# Patient Record
Sex: Female | Born: 1980 | Race: Black or African American | Hispanic: No | Marital: Married | Smoking: Never smoker
Health system: Southern US, Community
[De-identification: ages and names within clinical notes are randomized; demographics above are authoritative.]

## PROBLEM LIST (undated history)

## (undated) DIAGNOSIS — K5190 Ulcerative colitis, unspecified, without complications: Secondary | ICD-10-CM

## (undated) DIAGNOSIS — N809 Endometriosis, unspecified: Secondary | ICD-10-CM

## (undated) DIAGNOSIS — K589 Irritable bowel syndrome without diarrhea: Secondary | ICD-10-CM

## (undated) DIAGNOSIS — Z889 Allergy status to unspecified drugs, medicaments and biological substances status: Secondary | ICD-10-CM

## (undated) DIAGNOSIS — K219 Gastro-esophageal reflux disease without esophagitis: Secondary | ICD-10-CM

## (undated) DIAGNOSIS — K523 Indeterminate colitis: Secondary | ICD-10-CM

## (undated) DIAGNOSIS — K2970 Gastritis, unspecified, without bleeding: Secondary | ICD-10-CM

## (undated) DIAGNOSIS — I10 Essential (primary) hypertension: Secondary | ICD-10-CM

## (undated) DIAGNOSIS — T7840XA Allergy, unspecified, initial encounter: Secondary | ICD-10-CM

## (undated) HISTORY — DX: Gastro-esophageal reflux disease without esophagitis: K21.9

## (undated) HISTORY — DX: Allergy status to unspecified drugs, medicaments and biological substances: Z88.9

## (undated) HISTORY — DX: Essential (primary) hypertension: I10

## (undated) HISTORY — DX: Irritable bowel syndrome, unspecified: K58.9

## (undated) HISTORY — PX: COLONOSCOPY: SHX174

## (undated) HISTORY — DX: Indeterminate colitis: K52.3

## (undated) HISTORY — PX: WISDOM TOOTH EXTRACTION: SHX21

## (undated) HISTORY — PX: TUBAL LIGATION: SHX77

## (undated) HISTORY — DX: Allergy, unspecified, initial encounter: T78.40XA

---

## 2012-01-26 ENCOUNTER — Emergency Department (HOSPITAL_COMMUNITY)
Admission: EM | Admit: 2012-01-26 | Discharge: 2012-01-26 | Disposition: A | Discharge status: Home / Self Care | Payer: Self-pay | Attending: Emergency Medicine | Admitting: Emergency Medicine

## 2012-01-26 ENCOUNTER — Encounter (HOSPITAL_COMMUNITY): Payer: Self-pay | Admitting: Adult Health

## 2012-01-26 DIAGNOSIS — J3489 Other specified disorders of nose and nasal sinuses: Secondary | ICD-10-CM | POA: Insufficient documentation

## 2012-01-26 DIAGNOSIS — J329 Chronic sinusitis, unspecified: Secondary | ICD-10-CM | POA: Insufficient documentation

## 2012-01-26 MED ORDER — AMOXICILLIN 500 MG PO CAPS: 500.0000 mg | ORAL_CAPSULE | Freq: Three times a day (TID) | ORAL | Status: DC

## 2012-01-26 MED ORDER — CETIRIZINE-PSEUDOEPHEDRINE ER 5-120 MG PO TB12: 1.0000 | ORAL_TABLET | Freq: Two times a day (BID) | ORAL | Status: DC

## 2012-01-26 MED ORDER — GUAIFENESIN ER 600 MG PO TB12: 1200.0000 mg | ORAL_TABLET | Freq: Two times a day (BID) | ORAL | Status: DC

## 2012-01-26 NOTE — ED Provider Notes (Signed)
History     CSN: [id number]  Arrival date & time 01/26/12  2014   First MD Initiated Contact with Patient 01/26/12 2157      Chief Complaint  Patient presents with  . Facial Pain   HPI  History provided by the patient. Patient is a 31 year old female with no significant PMH who presents with complaints of persistent and continued sinus pressure with face and headache pains. Patient reports having sinus is congestion symptoms for the past several weeks. She was seen and diagnosed with a sinus infection on November 25. At that time she was given a prescription for Flonase and prednisone. Patient took a short course of prednisone and has been using the Flonase. She reported having some improvement of symptoms but they continued to linger and have now worsened again. Pressure at times builds and puts pain by both eyes and 4 head. Patient also has frequent popping of the ears. She denies having any postnasal drip or sore throat. Denies any coughing. Denies any fever, chills or sweats. Patient does not normally have seasonal allergies or similar symptoms frequently.    History reviewed. No pertinent past medical history.  History reviewed. No pertinent past surgical history.  History reviewed. No pertinent family history.  History  Substance Use Topics  . Smoking status: Never Smoker   . Smokeless tobacco: Not on file  . Alcohol Use: No    OB History    Grav Para Term Preterm Abortions TAB SAB Ect Mult Living                  Review of Systems  Constitutional: Negative for fever, chills and diaphoresis.  HENT: Positive for congestion and sinus pressure. Negative for sore throat, rhinorrhea, trouble swallowing and neck pain.   Respiratory: Negative for cough.   Gastrointestinal: Negative for nausea, vomiting and diarrhea.  All other systems reviewed and are negative.    Allergies  Keflex  Home Medications   Current Outpatient Rx  Name  Route  Sig  Dispense  Refill  .  FLUTICASONE PROPIONATE 50 MCG/ACT NA SUSP   Nasal   Place 2 sprays into the nose daily.         . IBUPROFEN 200 MG PO TABS   Oral   Take 600 mg by mouth every 6 (six) hours as needed. For pain           BP 129/87  Pulse 72  Temp 98.9 F (37.2 C) (Oral)  Resp 16  SpO2 98%  Physical Exam  Nursing note and vitals reviewed. Constitutional: She is oriented to person, place, and time. She appears well-developed and well-nourished. No distress.  HENT:  Head: Normocephalic.  Right Ear: Tympanic membrane normal.  Left Ear: Tympanic membrane normal.  Mouth/Throat: Oropharynx is clear and moist.  Cardiovascular: Normal rate and regular rhythm.   Pulmonary/Chest: Effort normal and breath sounds normal. No respiratory distress. She has no wheezes. She has no rales.  Abdominal: Soft. There is no tenderness.  Neurological: She is alert and oriented to person, place, and time.  Skin: Skin is warm and dry. No rash noted.  Psychiatric: She has a normal mood and affect. Her behavior is normal.    ED Course  Procedures       1. Sinusitis       MDM  10:15PM patient seen and evaluated. Patient well-appearing and nontoxic.        Angus Seller, [place] 01/26/12 [phone]

## 2012-01-26 NOTE — ED Notes (Signed)
Presents with recent dx of sinus infection on the 25th of November. Given prednisone spray. Pressure in sinuses and eyes is getting worse also having more frequent headaches. Pt is alert and opriented, denies blurred vision. No neuro deficits.

## 2012-01-27 NOTE — ED Provider Notes (Signed)
Medical screening examination/treatment/procedure(s) were conducted as a shared visit with non-physician practitioner(s) and myself.  I personally evaluated the patient during the encounter  Marylen Zuk, MD 01/27/12 0118 

## 2012-04-09 ENCOUNTER — Encounter (HOSPITAL_COMMUNITY): Payer: Self-pay | Admitting: Emergency Medicine

## 2012-04-09 ENCOUNTER — Other Ambulatory Visit: Payer: Self-pay

## 2012-04-09 ENCOUNTER — Emergency Department (HOSPITAL_COMMUNITY): Payer: BC Managed Care – PPO

## 2012-04-09 ENCOUNTER — Emergency Department (HOSPITAL_COMMUNITY)
Admission: EM | Admit: 2012-04-09 | Discharge: 2012-04-09 | Disposition: A | Discharge status: Home / Self Care | Payer: BC Managed Care – PPO | Attending: Emergency Medicine | Admitting: Emergency Medicine

## 2012-04-09 DIAGNOSIS — J3489 Other specified disorders of nose and nasal sinuses: Secondary | ICD-10-CM | POA: Insufficient documentation

## 2012-04-09 DIAGNOSIS — R079 Chest pain, unspecified: Secondary | ICD-10-CM | POA: Insufficient documentation

## 2012-04-09 DIAGNOSIS — K2970 Gastritis, unspecified, without bleeding: Secondary | ICD-10-CM | POA: Insufficient documentation

## 2012-04-09 DIAGNOSIS — Z3202 Encounter for pregnancy test, result negative: Secondary | ICD-10-CM | POA: Insufficient documentation

## 2012-04-09 DIAGNOSIS — R6883 Chills (without fever): Secondary | ICD-10-CM | POA: Insufficient documentation

## 2012-04-09 DIAGNOSIS — Z8719 Personal history of other diseases of the digestive system: Secondary | ICD-10-CM | POA: Insufficient documentation

## 2012-04-09 DIAGNOSIS — R0602 Shortness of breath: Secondary | ICD-10-CM | POA: Insufficient documentation

## 2012-04-09 DIAGNOSIS — H6690 Otitis media, unspecified, unspecified ear: Secondary | ICD-10-CM | POA: Insufficient documentation

## 2012-04-09 HISTORY — DX: Gastritis, unspecified, without bleeding: K29.70

## 2012-04-09 LAB — CBC WITH DIFFERENTIAL/PLATELET
Basophils Absolute: 0 10*3/uL (ref 0.0–0.1)
Basophils Relative: 0 % (ref 0–1)
Hemoglobin: 13.4 g/dL (ref 12.0–15.0)
MCHC: 33 g/dL (ref 30.0–36.0)
Neutro Abs: 13.1 10*3/uL — ABNORMAL HIGH (ref 1.7–7.7)
Neutrophils Relative %: 80 % — ABNORMAL HIGH (ref 43–77)
RDW: 12.5 % (ref 11.5–15.5)

## 2012-04-09 LAB — BASIC METABOLIC PANEL
Chloride: 101 mEq/L (ref 96–112)
GFR calc Af Amer: >90 mL/min (ref >90)
Potassium: 3.7 mEq/L (ref 3.5–5.1)

## 2012-04-09 LAB — POCT I-STAT TROPONIN I: Troponin i, poc: 0.01 ng/mL (ref 0.00–0.08)

## 2012-04-09 LAB — POCT PREGNANCY, URINE: Preg Test, Ur: NEGATIVE

## 2012-04-09 MED ORDER — AMOXICILLIN 500 MG PO CAPS: 500.0000 mg | ORAL_CAPSULE | Freq: Three times a day (TID) | ORAL | Status: DC

## 2012-04-09 NOTE — ED Notes (Addendum)
Pt c/o midsternal CP with radiation into neck x 1 week worse today; pt sts some SOB and pressure with pain into neck; pt sts pain worse with inspiration, cough and palpation

## 2012-04-09 NOTE — ED Notes (Signed)
Discharge instructions reviewed. Pt verbalized understanding.  

## 2012-04-09 NOTE — ED Provider Notes (Signed)
History     CSN: [id number]  Arrival date & time 04/09/12  4098   First MD Initiated Contact with Patient 04/09/12 1043      Chief Complaint  Patient presents with  . Chest Pain    (Consider location/radiation/quality/duration/timing/severity/associated sxs/prior treatment) Patient is a 32 y.o. female presenting with chest pain. The history is provided by the patient. No language interpreter was used.  Chest Pain Pain location:  Substernal area (Pt had chest pain last week.  She also felt nasal congestion and earache.  Today she was at school and nearly passed out.) Pain quality: aching   Pain radiates to:  Upper back Pain radiates to the back: yes   Pain severity:  Moderate Onset quality:  Gradual Duration: Chest pain started last week. Timing:  Intermittent Progression:  Waxing and waning Chronicity:  New Relieved by:  Nothing Ineffective treatments:  None tried Associated symptoms: shortness of breath   Associated symptoms: no cough and no fever   Associated symptoms comment:  Earache, nasal congestion. Risk factors comment:  She took Zyrtec and prednisone, which she had from a prior illness, without benefit.   Past Medical History  Diagnosis Date  . Gastritis     History reviewed. No pertinent past surgical history.  History reviewed. No pertinent family history.  History  Substance Use Topics  . Smoking status: Never Smoker   . Smokeless tobacco: Not on file  . Alcohol Use: No    OB History   Grav Para Term Preterm Abortions TAB SAB Ect Mult Living                  Review of Systems  Constitutional: Positive for chills. Negative for fever.  HENT: Positive for ear pain.   Eyes: Negative.   Respiratory: Positive for shortness of breath. Negative for cough.   Cardiovascular: Positive for chest pain.  Gastrointestinal: Negative.   Genitourinary: Negative.   Skin: Negative.   Neurological: Negative.   Psychiatric/Behavioral: Negative.      Allergies  Keflex  Home Medications   Current Outpatient Rx  Name  Route  Sig  Dispense  Refill  . cetirizine (ZYRTEC) 10 MG tablet   Oral   Take 10 mg by mouth daily as needed for allergies.         . fluticasone (FLONASE) 50 MCG/ACT nasal spray   Each Nare   Place 1 spray into both nostrils daily as needed for allergies.            BP 146/89  Pulse 79  Temp(Src) 98.9 F (37.2 C) (Oral)  Resp 18  SpO2 99%  Physical Exam  Nursing note and vitals reviewed. Constitutional: She is oriented to person, place, and time. She appears well-developed and well-nourished. No distress.  HENT:  Head: Normocephalic and atraumatic.  Both TM's red and retracted.  Oropharynx clear.  Eyes: Conjunctivae and EOM are normal. Pupils are equal, round, and reactive to light. Left eye exhibits no discharge. No scleral icterus.  Neck: Normal range of motion. Neck supple.  Cardiovascular: Normal rate, regular rhythm and normal heart sounds.   Pulmonary/Chest: Effort normal and breath sounds normal.  Abdominal: Soft. Bowel sounds are normal.  Musculoskeletal: Normal range of motion. She exhibits no edema and no tenderness.  Neurological: She is oriented to person, place, and time.  No sensory or motor deficit.  Skin: Skin is warm and dry.  Psychiatric: She has a normal mood and affect. Her behavior is normal.  ED Course  Procedures (including critical care time)    11:04 AM  Date: 04/09/2012  Rate: 89  Rhythm: normal sinus rhythm  QRS Axis: normal  Intervals: normal QRS:  Poor R wave progression in precordial leads suggests possible old anterior myocardial infarction.  ST/T Wave abnormalities: normal  Conduction Disutrbances:none  Narrative Interpretation: Abnormal EKG  Old EKG Reviewed: none available  Results for orders placed during the hospital encounter of 04/09/12  CBC WITH DIFFERENTIAL      Result Value Range   WBC 16.4 (*) 4.0 - 10.5 K/uL   RBC 5.07  3.87 - 5.11  MIL/uL   Hemoglobin 13.4  12.0 - 15.0 g/dL   HCT 36.6  44.0 - 34.7 %   MCV 80.1  78.0 - 100.0 fL   MCH 26.4  26.0 - 34.0 pg   MCHC 33.0  30.0 - 36.0 g/dL   RDW 42.5  95.6 - 38.7 %   Platelets 303  150 - 400 K/uL   Neutrophils Relative 80 (*) 43 - 77 %   Neutro Abs 13.1 (*) 1.7 - 7.7 K/uL   Lymphocytes Relative 14  12 - 46 %   Lymphs Abs 2.2  0.7 - 4.0 K/uL   Monocytes Relative 6  3 - 12 %   Monocytes Absolute 1.0  0.1 - 1.0 K/uL   Eosinophils Relative 0  0 - 5 %   Eosinophils Absolute 0.0  0.0 - 0.7 K/uL   Basophils Relative 0  0 - 1 %   Basophils Absolute 0.0  0.0 - 0.1 K/uL  BASIC METABOLIC PANEL      Result Value Range   Sodium 137  135 - 145 mEq/L   Potassium 3.7  3.5 - 5.1 mEq/L   Chloride 101  96 - 112 mEq/L   CO2 28  19 - 32 mEq/L   Glucose, Bld 89  70 - 99 mg/dL   BUN 10  6 - 23 mg/dL   Creatinine, Ser 5.64  0.50 - 1.10 mg/dL   Calcium 9.3  8.4 - 33.2 mg/dL   GFR calc non Af Amer >90  >90 mL/min   GFR calc Af Amer >90  >90 mL/min  POCT I-STAT TROPONIN I      Result Value Range   Troponin i, poc 0.01  0.00 - 0.08 ng/mL   Comment 3           POCT PREGNANCY, URINE      Result Value Range   Preg Test, Ur NEGATIVE  NEGATIVE   Dg Chest 2 View  04/09/2012  *RADIOLOGY REPORT*  Clinical Data: Chest pain  CHEST - 2 VIEW  Comparison: None.  Findings:  Lungs clear.  Heart size and pulmonary vascularity are normal.  No adenopathy.  No bone lesions.  No pneumothorax.  IMPRESSION: No abnormality noted.   Original Report Authenticated By: Bretta Bang, M.D.     Lab workup showed elevated WBC, suggesting infection.  TNI negative, chest x-ray negative.  No signs of acute coronary syndrome.  Advised amoxicillin, which she has taken before safely, for her otitis media/  1. Otitis media       Carleene Cooper III, MD 04/09/12 [phone]

## 2012-04-09 NOTE — ED Notes (Signed)
MD at bedside. 

## 2013-01-08 ENCOUNTER — Ambulatory Visit (INDEPENDENT_AMBULATORY_CARE_PROVIDER_SITE_OTHER): Payer: BC Managed Care – PPO | Admitting: Family Medicine

## 2013-01-08 ENCOUNTER — Encounter: Payer: Self-pay | Admitting: Family Medicine

## 2013-01-08 VITALS — BP 128/86 | HR 78 | Temp 98.2°F | Resp 18 | Ht 66.0 in | Wt 206.0 lb

## 2013-01-08 DIAGNOSIS — R635 Abnormal weight gain: Secondary | ICD-10-CM

## 2013-01-08 DIAGNOSIS — E669 Obesity, unspecified: Secondary | ICD-10-CM

## 2013-01-08 DIAGNOSIS — Z13220 Encounter for screening for lipoid disorders: Secondary | ICD-10-CM

## 2013-01-08 DIAGNOSIS — K5900 Constipation, unspecified: Secondary | ICD-10-CM

## 2013-01-08 MED ORDER — POLYETHYLENE GLYCOL 3350 17 GM/SCOOP PO POWD: 17.0000 g | Freq: Two times a day (BID) | ORAL | Status: DC | PRN

## 2013-01-08 MED ORDER — LORCASERIN HCL 10 MG PO TABS: ORAL_TABLET | ORAL | Status: DC

## 2013-01-08 NOTE — Progress Notes (Signed)
  Subjective:    Patient ID: Theresa Farmer, female    DOB: 09-16-1980, 32 y.o.   MRN: [id number]  HPI  Dr. Juliene Pina [facility]- [facility] Patient here to discuss her weight. She's been trying for many years to lose weight. Her heaviest weight was 230 pounds which is approximately 8 years ago. She's been maintaining her weight around 205 pounds since 2012. She's tried multiple work out the [employer], [employer], soon to be she's not been able to lose past 205 pounds. She's also tried dieting cutting out red meat and he then multiple Daniel fast. She would like to try prescription medication to help with her weight loss. She also suffers with constipation she typically takes a laxative every 3-4 days Lortab a bowel movement otherwise she can go a week or 2 without one.  Review of Systems  GEN- denies fatigue, fever, weight loss,weakness, recent illness HEENT- denies eye drainage, change in vision, nasal discharge, CVS- denies chest pain, palpitations RESP- denies SOB, cough, wheeze ABD- denies N/V, change in stools, abd pain GU- denies dysuria, hematuria, dribbling, incontinence MSK- denies joint pain, muscle aches, injury Neuro- denies headache, dizziness, syncope, seizure activity      Objective:   Physical Exam GEN- NAD, alert and oriented x3 HEENT- PERRL, EOMI, non injected sclera, pink conjunctiva, MMM, oropharynx clear Neck- Supple, no thyromegaly CVS- RRR, no murmur RESP-CTAB ABD-NABS,soft,NT,ND EXT- No edema Pulses- Radial, DP- 2+        Assessment & Plan:

## 2013-01-08 NOTE — Patient Instructions (Addendum)
Start the belviq 1 tablet twice a day with meals Miralax for the constipation once a day  Labs to be done - we will send a letter if normal  F/U 8 weeks

## 2013-01-09 LAB — CBC WITH DIFFERENTIAL/PLATELET
Basophils Absolute: 0 10*3/uL (ref 0.0–0.1)
Basophils Relative: 0 % (ref 0–1)
Hemoglobin: 12.5 g/dL (ref 12.0–15.0)
MCHC: 34.1 g/dL (ref 30.0–36.0)
Monocytes Relative: 7 % (ref 3–12)
Neutro Abs: 8.6 10*3/uL — ABNORMAL HIGH (ref 1.7–7.7)
Neutrophils Relative %: 72 % (ref 43–77)
Platelets: 319 10*3/uL (ref 150–400)
RBC: 4.71 MIL/uL (ref 3.87–5.11)

## 2013-01-09 LAB — LIPID PANEL: Cholesterol: 132 mg/dL (ref 0–200)

## 2013-01-09 LAB — TSH: TSH: 1.006 u[IU]/mL (ref 0.350–4.500)

## 2013-01-09 LAB — COMPREHENSIVE METABOLIC PANEL
AST: 15 U/L (ref 0–37)
Albumin: 4 g/dL (ref 3.5–5.2)
BUN: 9 mg/dL (ref 6–23)
Calcium: 9.2 mg/dL (ref 8.4–10.5)
Chloride: 104 mEq/L (ref 96–112)
Glucose, Bld: 88 mg/dL (ref 70–99)
Potassium: 3.5 mEq/L (ref 3.5–5.3)

## 2013-01-11 DIAGNOSIS — E669 Obesity, unspecified: Secondary | ICD-10-CM | POA: Insufficient documentation

## 2013-01-11 DIAGNOSIS — K5900 Constipation, unspecified: Secondary | ICD-10-CM | POA: Insufficient documentation

## 2013-01-11 NOTE — Assessment & Plan Note (Signed)
Pt obese based on BMI, also has family history of DM/HTN.   She would benefit from weight loss, to reduce her risk factors of CAD, DM, HTN Check fasting labs today Plan to start belviq 10mg  BID Short term goals set Continue exercise program cardio 30-45 minutes, 4-5 days a week

## 2013-01-11 NOTE — Assessment & Plan Note (Signed)
Miralax daily, increase fiber and water intake

## 2013-01-13 ENCOUNTER — Telehealth: Payer: Self-pay | Admitting: Physician Assistant

## 2013-01-13 ENCOUNTER — Encounter: Payer: Self-pay | Admitting: *Deleted

## 2013-01-13 NOTE — Telephone Encounter (Signed)
Weight loss pills were not covered about insurance and it was $238 for 30 days and she cant not afford that if there is something that is covered by [facility] Call back number is [phone]

## 2013-01-13 NOTE — Telephone Encounter (Signed)
Spoke to patient.  Told we have no idea what is covered under all the different insurance plans.  She needs to call her insurance provider to find out what her plan covers.  Then let us know and we will see what provider wants to order.

## 2013-03-05 ENCOUNTER — Ambulatory Visit: Payer: BC Managed Care – PPO | Admitting: Family Medicine

## 2013-04-10 ENCOUNTER — Telehealth: Payer: Self-pay | Admitting: Physician Assistant

## 2013-04-10 NOTE — Telephone Encounter (Signed)
Call back number is [phone] Pt is needing to speak to you about having a prior authorization on Lorcaserin HCl (BELVIQ) 10 MG TABS [facility]acy [facility] [address]   [phone] (prior Authorization)

## 2013-04-11 NOTE — Telephone Encounter (Signed)
Working on [employer] sending on fax

## 2013-04-15 NOTE — Telephone Encounter (Signed)
PA sent waiting on approval from insurance company

## 2013-05-01 NOTE — Telephone Encounter (Signed)
Call placed to insurance company.   PA submitted by phone.   Approved 04/01/2013- 07/30/2013.  Approval # [id number].  Call placed to patient and patient made aware.

## 2013-07-15 ENCOUNTER — Telehealth: Payer: Self-pay | Admitting: *Deleted

## 2013-07-15 NOTE — Telephone Encounter (Signed)
Received refill request for Belviq.   Ok to refill??  Last office visit/ refill 01/08/2013.

## 2013-07-15 NOTE — Telephone Encounter (Signed)
Call placed to patient and patient made aware.   Appointment scheduled & advised about past due balance.

## 2013-07-15 NOTE — Telephone Encounter (Signed)
Need OV

## 2013-07-18 ENCOUNTER — Ambulatory Visit: Payer: BC Managed Care – PPO | Admitting: Family Medicine

## 2013-07-21 ENCOUNTER — Other Ambulatory Visit: Payer: Self-pay | Admitting: *Deleted

## 2013-08-15 ENCOUNTER — Ambulatory Visit: Payer: BC Managed Care – PPO | Admitting: Family Medicine

## 2013-09-01 ENCOUNTER — Ambulatory Visit: Payer: BC Managed Care – PPO | Admitting: Family Medicine

## 2013-09-05 ENCOUNTER — Ambulatory Visit: Payer: BC Managed Care – PPO | Admitting: Family Medicine

## 2014-03-16 ENCOUNTER — Ambulatory Visit: Payer: BC Managed Care – PPO | Admitting: Family Medicine

## 2014-10-09 ENCOUNTER — Encounter: Payer: Self-pay | Admitting: Family Medicine

## 2014-10-09 ENCOUNTER — Ambulatory Visit
Admission: RE | Admit: 2014-10-09 | Discharge: 2014-10-09 | Disposition: A | Discharge status: Home / Self Care | Payer: BC Managed Care – PPO | Source: Ambulatory Visit | Attending: Family Medicine | Admitting: Family Medicine

## 2014-10-09 ENCOUNTER — Telehealth: Payer: Self-pay | Admitting: *Deleted

## 2014-10-09 ENCOUNTER — Ambulatory Visit (INDEPENDENT_AMBULATORY_CARE_PROVIDER_SITE_OTHER): Payer: BC Managed Care – PPO | Admitting: Family Medicine

## 2014-10-09 VITALS — BP 122/74 | HR 64 | Temp 98.4°F | Resp 12 | Ht 66.0 in | Wt 219.0 lb

## 2014-10-09 DIAGNOSIS — Z13220 Encounter for screening for lipoid disorders: Secondary | ICD-10-CM

## 2014-10-09 DIAGNOSIS — R197 Diarrhea, unspecified: Secondary | ICD-10-CM

## 2014-10-09 DIAGNOSIS — E669 Obesity, unspecified: Secondary | ICD-10-CM

## 2014-10-09 LAB — CBC WITH DIFFERENTIAL/PLATELET
BASOS PCT: 0 % (ref 0–1)
Basophils Absolute: 0 10*3/uL (ref 0.0–0.1)
EOS ABS: 0.1 10*3/uL (ref 0.0–0.7)
EOS PCT: 1 % (ref 0–5)
HCT: 35.7 % — ABNORMAL LOW (ref 36.0–46.0)
HEMOGLOBIN: 11.6 g/dL — AB (ref 12.0–15.0)
Lymphocytes Relative: 25 % (ref 12–46)
Lymphs Abs: 2.4 10*3/uL (ref 0.7–4.0)
MCH: 25.5 pg — AB (ref 26.0–34.0)
MCHC: 32.5 g/dL (ref 30.0–36.0)
MCV: 78.5 fL (ref 78.0–100.0)
MONO ABS: 0.6 10*3/uL (ref 0.1–1.0)
MONOS PCT: 6 % (ref 3–12)
MPV: 8.7 fL (ref 8.6–12.4)
Neutro Abs: 6.4 10*3/uL (ref 1.7–7.7)
Neutrophils Relative %: 68 % (ref 43–77)
Platelets: 339 10*3/uL (ref 150–400)
RBC: 4.55 MIL/uL (ref 3.87–5.11)
RDW: 13.6 % (ref 11.5–15.5)
WBC: 9.4 10*3/uL (ref 4.0–10.5)

## 2014-10-09 LAB — COMPREHENSIVE METABOLIC PANEL
ALBUMIN: 3.6 g/dL (ref 3.6–5.1)
ALT: 10 U/L (ref 6–29)
AST: 10 U/L (ref 10–30)
Alkaline Phosphatase: 83 U/L (ref 33–115)
BILIRUBIN TOTAL: 0.2 mg/dL (ref 0.2–1.2)
BUN: 7 mg/dL (ref 7–25)
CALCIUM: 8.4 mg/dL — AB (ref 8.6–10.2)
CHLORIDE: 104 mmol/L (ref 98–110)
CO2: 25 mmol/L (ref 20–31)
CREATININE: 0.99 mg/dL (ref 0.50–1.10)
GLUCOSE: 90 mg/dL (ref 70–99)
Potassium: 4 mmol/L (ref 3.5–5.3)
SODIUM: 138 mmol/L (ref 135–146)
Total Protein: 6.9 g/dL (ref 6.1–8.1)

## 2014-10-09 LAB — LIPID PANEL
CHOL/HDL RATIO: 3.6 ratio (ref <=5.0)
CHOLESTEROL: 143 mg/dL (ref 125–200)
HDL: 40 mg/dL — ABNORMAL LOW (ref >=46)
LDL CALC: 90 mg/dL (ref <130)
TRIGLYCERIDES: 67 mg/dL (ref <150)
VLDL: 13 mg/dL (ref <30)

## 2014-10-09 LAB — TSH: TSH: 1.619 u[IU]/mL (ref 0.350–4.500)

## 2014-10-09 NOTE — Progress Notes (Signed)
Patient ID: Theresa Farmer, female   DOB: 1980-05-30, 34 y.o.   MRN: [id number]   Subjective:    Patient ID: Theresa Farmer, female    DOB: 05/27/1980, 34 y.o.   MRN: [id number]  Patient presents for Frequent Diarrhea  patient here with frequent episodes of diarrhea. For the past 6 months or more she's had episodes of diarrhea every  time she eats sometimes it is watery other times it is more of a pasty but she has not had any formed stools. She does notice it when she drinks red wine and fast food but then tried to back off of the somewhat still have the episodes no matter what she ate. She has history of gastroenteritis in the past and she had some acid reflux but those symptoms have resolved. She's had problems with severe constipation since she was a child but after she did some type of detox about a year ago her bowels are more regular. She only gets cramping wipes before she has a bowel movement but often has to go very quickly or she will have an accident. She has passed gas and had leakage of stool before. She is also having bloating and a lot of flatulence. She has family history of constipation  No weight loss over the period of this change in her bowels   Review Of Systems:  GEN- denies fatigue, fever, weight loss,weakness, recent illness HEENT- denies eye drainage, change in vision, nasal discharge, CVS- denies chest pain, palpitations RESP- denies SOB, cough, wheeze ABD- denies N/V, +change in stools, +abd pain GU- denies dysuria, hematuria, dribbling, incontinence MSK- denies joint pain, muscle aches, injury Neuro- denies headache, dizziness, syncope, seizure activity       Objective:    BP 122/74 mmHg  Pulse 64  Temp(Src) 98.4 F (36.9 C) (Oral)  Resp 12  Ht  (1.676 m)  Wt 219 lb (99.338 kg)  BMI 35.36 kg/m2  LMP 09/21/2014 (Approximate) GEN- NAD, alert and oriented x3 HEENT- PERRL, EOMI, non injected sclera, pink conjunctiva, MMM, oropharynx clear Neck- Supple, no  LAD  CVS- RRR, no murmur RESP-CTAB ABD-NABS,soft,NT,ND  EXT- No edema Pulses- Radial, DP- 2+        Assessment & Plan:      Problem List Items Addressed This Visit    Obesity    Other Visit Diagnoses    Diarrhea    -  Primary    ? if underlying constipation, will check abdominal xray, will also check labs and celiac antibody, stool culture as she has problems with certain foods. If these are negative will sendto GI    Relevant Orders    CBC with Differential/Platelet    Comprehensive metabolic panel    TSH    DG Abd 2 Views (Completed)    Stool culture    Fecal leukocytes    Fecal Occult Blood, Guaiac    Gliadin antibodies, serum    Fecal Lactoferrin    Fecal occult blood, imunochemical    Screening cholesterol level        Relevant Orders    Lipid panel       Note: This dictation was prepared with Dragon dictation along with smaller phrase technology. Any transcriptional errors that result from this process are unintentional.

## 2014-10-09 NOTE — Patient Instructions (Addendum)
Get xray done- [address] [place], [place] We will call with lab results Bring back stool sample F/U pending results

## 2014-10-09 NOTE — Telephone Encounter (Signed)
Patient called for ABD X-ray result.  Advised patient of negative x-ray.  X-ray did show moderate stool burden.  Advised Miralax daily, call back for Gi referral if still having pain.

## 2014-10-10 LAB — FECAL OCCULT BLOOD, IMMUNOCHEMICAL: FECAL OCCULT BLOOD: POSITIVE — AB

## 2014-10-10 LAB — FECAL LACTOFERRIN, QUANT: Lactoferrin: POSITIVE

## 2014-10-12 LAB — GLIADIN ANTIBODIES, SERUM
Gliadin IgA: 5 Units (ref <20)
Gliadin IgG: 2 Units (ref <20)

## 2014-10-13 LAB — STOOL CULTURE

## 2014-10-14 ENCOUNTER — Other Ambulatory Visit: Payer: Self-pay | Admitting: *Deleted

## 2014-10-14 DIAGNOSIS — K921 Melena: Secondary | ICD-10-CM

## 2014-10-14 DIAGNOSIS — D649 Anemia, unspecified: Secondary | ICD-10-CM

## 2014-10-14 DIAGNOSIS — K589 Irritable bowel syndrome without diarrhea: Secondary | ICD-10-CM

## 2014-10-16 ENCOUNTER — Encounter: Payer: Self-pay | Admitting: Physician Assistant

## 2014-11-04 ENCOUNTER — Ambulatory Visit: Payer: BC Managed Care – PPO | Admitting: Physician Assistant

## 2014-11-12 ENCOUNTER — Other Ambulatory Visit: Payer: Self-pay | Admitting: Family Medicine

## 2014-11-20 ENCOUNTER — Ambulatory Visit (INDEPENDENT_AMBULATORY_CARE_PROVIDER_SITE_OTHER): Payer: BC Managed Care – PPO | Admitting: Gastroenterology

## 2014-11-20 ENCOUNTER — Encounter: Payer: Self-pay | Admitting: Gastroenterology

## 2014-11-20 VITALS — BP 112/70 | Ht 66.0 in | Wt 214.0 lb

## 2014-11-20 DIAGNOSIS — K625 Hemorrhage of anus and rectum: Secondary | ICD-10-CM | POA: Diagnosis not present

## 2014-11-20 NOTE — Progress Notes (Signed)
11/20/2014 Theresa Farmer [id number] 27-Jan-1981   HISTORY OF PRESENT ILLNESS:  This is a 34 year old female who is new to our practice and was referred here by her PCP, Dr. Jeanice Lim, to discuss colonoscopy.  She reports issues with chronic constipation since she was a child.  Recalls being told in the past that she likely has IBS.  Has been seeing blood in her stools with most bowel movements for a while.  Recently she had several weeks of diarrhea and saw her PCP.  FOBT was positive along with fecal WBC/lactoferrin.  Stool culture was negative.  CBC, TSH, CMP were all unremarkable.  Has a mild anemia with Hgb 11.6 grams.  Diarrhea/loose stools have since resolved, but she continues to see red blood with BM's.  She knows that she has hemorrhoids and contributes the bleeding to those.   Past Medical History  Diagnosis Date  . Gastritis   . IBS (irritable bowel syndrome)    Past Surgical History  Procedure Laterality Date  . Tubal ligation      reports that she has never smoked. She has never used smokeless tobacco. She reports that she drinks about 1.2 oz of alcohol per week. She reports that she does not use illicit drugs. family history includes Breast cancer in her maternal grandmother; Colon cancer in an other family member; Diabetes in an other family member; Hypertension in an other family member; Irritable bowel syndrome in an other family member. Allergies  Allergen Reactions  . Keflex [Cephalexin]     hives      Outpatient Encounter Prescriptions as of 11/20/2014  Medication Sig  . cetirizine (ZYRTEC) 10 MG tablet Take 10 mg by mouth daily as needed for allergies.  . ferrous sulfate 325 (65 FE) MG tablet TAKE 1 TABLET BY MOUTH TWICE A DAY  . fluticasone (FLONASE) 50 MCG/ACT nasal spray Place 1 spray into both nostrils daily as needed for allergies.   Marland Kitchen norethindrone-ethinyl estradiol (JUNEL FE,GILDESS FE,LOESTRIN FE) 1-20 MG-MCG tablet Take 1 tablet by mouth daily.  .  [DISCONTINUED] UNKNOWN TO PATIENT Birth control pills- (1) tab PO QD   No facility-administered encounter medications on file as of 11/20/2014.     REVIEW OF SYSTEMS  : All other systems reviewed and negative except where noted in the History of Present Illness.   PHYSICAL EXAM: BP 112/70 mmHg  Ht  (1.676 m)  Wt 214 lb (97.07 kg)  BMI 34.56 kg/m2  LMP 10/20/2014 General: Well developed black female in no acute distress Head: Normocephalic and atraumatic Eyes:  Sclerae anicteric, conjunctiva pink. Ears: Normal auditory acuity Lungs: Clear throughout to auscultation Heart: Regular rate and rhythm Abdomen: Soft, non-distended.  Normal bowel sounds.  Non-tender. Rectal:  Will be done at the time of colonoscopy. Musculoskeletal: Symmetrical with no gross deformities  Skin: No lesions on visible extremities Extremities: No edema  Neurological: Alert oriented x 4, grossly non-focal Psychological:  Alert and cooperative. Normal mood and affect  ASSESSMENT AND PLAN: -Rectal bleeding:  Likely hemorrhoidal, but occurring quite frequently.  PCP requesting colonoscopy.  Will schedule with Dr. Adela Lank.  The risks, benefits, and alternatives to colonoscopy were discussed with the patient and she consents to proceed.  -IBS-C/CIC:  Lifelong constipation.  Consider Miralax daily.  Recalls being told that she likely has IBS in the past. -Previous diarrhea/loose stools:  Now resolved.  Was likely infectious source with short duration and positive fecal WBC.  CC:  Theresa Bodo, PA-C

## 2014-11-20 NOTE — Patient Instructions (Signed)
You have been scheduled for a colonoscopy. Please follow written instructions given to you at your visit today.  We have given you a free prep sample. If you use inhalers (even only as needed), please bring them with you on the day of your procedure. Your physician has requested that you go to www.startemmi.com and enter the access code given to you at your visit today. This web site gives a general overview about your procedure. However, you should still follow specific instructions given to you by our office regarding your preparation for the procedure.  We have given you a work note for today.

## 2014-11-23 NOTE — Progress Notes (Signed)
Agree with assessment and plan. Will await colonoscopy results 

## 2014-12-04 ENCOUNTER — Ambulatory Visit (AMBULATORY_SURGERY_CENTER): Payer: BC Managed Care – PPO | Admitting: Gastroenterology

## 2014-12-04 ENCOUNTER — Encounter: Payer: Self-pay | Admitting: Gastroenterology

## 2014-12-04 VITALS — BP 130/58 | HR 59 | Temp 98.7°F | Resp 8 | Ht 66.0 in | Wt 214.0 lb

## 2014-12-04 DIAGNOSIS — K529 Noninfective gastroenteritis and colitis, unspecified: Secondary | ICD-10-CM | POA: Diagnosis not present

## 2014-12-04 DIAGNOSIS — K625 Hemorrhage of anus and rectum: Secondary | ICD-10-CM | POA: Diagnosis not present

## 2014-12-04 MED ORDER — MESALAMINE 1.2 G PO TBEC: 2.4000 g | DELAYED_RELEASE_TABLET | Freq: Every day | ORAL | Status: DC

## 2014-12-04 MED ORDER — SODIUM CHLORIDE 0.9 % IV SOLN: 500.0000 mL | INTRAVENOUS | Status: DC

## 2014-12-04 NOTE — Op Note (Signed)
[facility] [address] [place], [id number]   COLONOSCOPY PROCEDURE REPORT  PATIENT: Theresa Farmer, Theresa Farmer  MR#: [id number] BIRTHDATE: 01-23-1981 , 34  yrs. old GENDER: female ENDOSCOPIST: Benancio DeedsSteven P Donovon Micheletti, MD REFERRED BY: Milinda AntisKawanta Cidra MD PROCEDURE DATE:  12/04/2014 PROCEDURE:   Colonoscopy, diagnostic and Colonoscopy with biopsy  ASA CLASS:   Class II INDICATIONS:Evaluation of unexplained GI bleeding and Colorectal Neoplasm Risk Assessment for this procedure is average risk. MEDICATIONS: Propofol 325 mg IV  DESCRIPTION OF PROCEDURE:   After the risks benefits and alternatives of the procedure were thoroughly explained, informed consent was obtained.  The digital rectal exam revealed external hemorrhoids and revealed an anal fissure.   The [id number]-[id number] endoscope was introduced through the anus and advanced to the terminal ileum which was intubated for a short distance. No adverse events experienced.   The quality of the prep was adequate  The instrument was then slowly withdrawn as the colon was fully examined. Estimated blood loss is zero unless otherwise noted in this procedure report.      COLON FINDINGS: Inflammation was found throughout the examined colon, most severe in the right and transverse colon, with mild changes in the rectum and distal left colon.  Inflammation was characterized by erythema, loss of normal vascularity, friability, granularity, with some mucous noted.  Biopsies were taken throughout the examined colon.  The terminal ileum appeared normal. Retroflexed views revealed internal hemorrhoids. The time to cecum = 3.9 Withdrawal time = 12.3   The scope was withdrawn and the procedure completed. COMPLICATIONS: There were no immediate complications.  ENDOSCOPIC IMPRESSION: Pancolitis as described above, with most severe changes in transverse and right colon, with mild changes in the rectum. Biopsies taken to rule out IBD.  Normal ileum. Differential includes most likely IBD (favor ulcerative colitis) or NSAID enteropathy vs. less likely bowel preparation artifact.  RECOMMENDATIONS: Start trial of mesalamine Resume diet Resume medications NO NSAIDs Await pathology results. You will be contacted with pathology results when available.  eSigned:  Benancio DeedsSteven P Aariona Momon, MD 12/04/2014 11:19 AM   cc: Milinda AntisKawanta Silsbee MD, the patient   PATIENT NAME:  Theresa Farmer, Theresa Farmer MR#: [id number]

## 2014-12-04 NOTE — Progress Notes (Signed)
Called to room to assist during endoscopic procedure.  Patient ID and intended procedure confirmed with present staff. Received instructions for my participation in the procedure from the performing physician.  

## 2014-12-04 NOTE — Progress Notes (Signed)
Transferred to recovery room. A/O x3, pleased with MAC.  VSS.  Report to Celia, RN. 

## 2014-12-04 NOTE — Patient Instructions (Signed)
Discharge instructions given. Biopsies taken. Resume previous medications. YOU HAD AN ENDOSCOPIC PROCEDURE TODAY AT THE [facility]:   Refer to the procedure report that was given to you for any specific questions about what was found during the examination.  If the procedure report does not answer your questions, please call your gastroenterologist to clarify.  If you requested that your care partner not be given the details of your procedure findings, then the procedure report has been included in a sealed envelope for you to review at your convenience later.  YOU SHOULD EXPECT: Some feelings of bloating in the abdomen. Passage of more gas than usual.  Walking can help get rid of the air that was put into your GI tract during the procedure and reduce the bloating. If you had a lower endoscopy (such as a colonoscopy or flexible sigmoidoscopy) you may notice spotting of blood in your stool or on the toilet paper. If you underwent a bowel prep for your procedure, you may not have a normal bowel movement for a few days.  Please Note:  You might notice some irritation and congestion in your nose or some drainage.  This is from the oxygen used during your procedure.  There is no need for concern and it should clear up in a day or so.  SYMPTOMS TO REPORT IMMEDIATELY:   Following lower endoscopy (colonoscopy or flexible sigmoidoscopy):  Excessive amounts of blood in the stool  Significant tenderness or worsening of abdominal pains  Swelling of the abdomen that is new, acute  Fever of 100F or higher   For urgent or emergent issues, a gastroenterologist can be reached at any hour by calling [phone].   DIET: Your first meal following the procedure should be a small meal and then it is ok to progress to your normal diet. Heavy or fried foods are harder to digest and may make you feel nauseous or bloated.  Likewise, meals heavy in dairy and vegetables can increase bloating.  Drink  plenty of fluids but you should avoid alcoholic beverages for 24 hours.  ACTIVITY:  You should plan to take it easy for the rest of today and you should NOT DRIVE or use heavy machinery until tomorrow (because of the sedation medicines used during the test).    FOLLOW UP: Our staff will call the number listed on your records the next business day following your procedure to check on you and address any questions or concerns that you may have regarding the information given to you following your procedure. If we do not reach you, we will leave a message.  However, if you are feeling well and you are not experiencing any problems, there is no need to return our call.  We will assume that you have returned to your regular daily activities without incident.  If any biopsies were taken you will be contacted by phone or by letter within the next 1-3 weeks.  Please call us at [phone] if you have not heard about the biopsies in 3 weeks.    SIGNATURES/CONFIDENTIALITY: You and/or your care partner have signed paperwork which will be entered into your electronic medical record.  These signatures attest to the fact that that the information above on your After Visit Summary has been reviewed and is understood.  Full responsibility of the confidentiality of this discharge information lies with you and/or your care-partner. 

## 2014-12-07 ENCOUNTER — Telehealth: Payer: Self-pay | Admitting: *Deleted

## 2014-12-07 NOTE — Telephone Encounter (Signed)
  Follow up Call-  Call back number 12/04/2014  Post procedure Call Back phone  # [phone]  Permission to leave phone message No     Patient questions:  Do you have a fever, pain , or abdominal swelling? No. Pain Score  0 *  Have you tolerated food without any problems? Yes.    Have you been able to return to your normal activities? Yes.    Do you have any questions about your discharge instructions: Diet   No. Medications  No. Follow up visit  No.  Do you have questions or concerns about your Care? No.  Actions: * If pain score is 4 or above: No action needed, pain <4.

## 2014-12-10 ENCOUNTER — Other Ambulatory Visit: Payer: Self-pay

## 2014-12-10 DIAGNOSIS — K529 Noninfective gastroenteritis and colitis, unspecified: Secondary | ICD-10-CM

## 2014-12-10 MED ORDER — MESALAMINE 1.2 G PO TBEC: 4.8000 g | DELAYED_RELEASE_TABLET | Freq: Every day | ORAL | Status: DC

## 2014-12-14 ENCOUNTER — Emergency Department (HOSPITAL_COMMUNITY)
Admission: EM | Admit: 2014-12-14 | Discharge: 2014-12-15 | Disposition: A | Discharge status: Home / Self Care | Payer: BC Managed Care – PPO | Attending: Emergency Medicine | Admitting: Emergency Medicine

## 2014-12-14 ENCOUNTER — Encounter (HOSPITAL_COMMUNITY): Payer: Self-pay | Admitting: Vascular Surgery

## 2014-12-14 DIAGNOSIS — R109 Unspecified abdominal pain: Secondary | ICD-10-CM

## 2014-12-14 DIAGNOSIS — Z793 Long term (current) use of hormonal contraceptives: Secondary | ICD-10-CM | POA: Diagnosis not present

## 2014-12-14 DIAGNOSIS — R1084 Generalized abdominal pain: Secondary | ICD-10-CM | POA: Insufficient documentation

## 2014-12-14 DIAGNOSIS — Z79899 Other long term (current) drug therapy: Secondary | ICD-10-CM | POA: Insufficient documentation

## 2014-12-14 DIAGNOSIS — Z8719 Personal history of other diseases of the digestive system: Secondary | ICD-10-CM | POA: Diagnosis not present

## 2014-12-14 HISTORY — DX: Ulcerative colitis, unspecified, without complications: K51.90

## 2014-12-14 LAB — URINE MICROSCOPIC-ADD ON

## 2014-12-14 LAB — COMPREHENSIVE METABOLIC PANEL
ALT: 13 U/L — ABNORMAL LOW (ref 14–54)
AST: 18 U/L (ref 15–41)
Albumin: 3.4 g/dL — ABNORMAL LOW (ref 3.5–5.0)
Alkaline Phosphatase: 65 U/L (ref 38–126)
Anion gap: 9 (ref 5–15)
BUN: <5 mg/dL — ABNORMAL LOW (ref 6–20)
CHLORIDE: 102 mmol/L (ref 101–111)
CO2: 23 mmol/L (ref 22–32)
CREATININE: 0.96 mg/dL (ref 0.44–1.00)
Calcium: 8.8 mg/dL — ABNORMAL LOW (ref 8.9–10.3)
Glucose, Bld: 125 mg/dL — ABNORMAL HIGH (ref 65–99)
POTASSIUM: 3.1 mmol/L — AB (ref 3.5–5.1)
Sodium: 134 mmol/L — ABNORMAL LOW (ref 135–145)
Total Bilirubin: 0.4 mg/dL (ref 0.3–1.2)
Total Protein: 7.8 g/dL (ref 6.5–8.1)

## 2014-12-14 LAB — CBC
HCT: 36.3 % (ref 36.0–46.0)
Hemoglobin: 11.7 g/dL — ABNORMAL LOW (ref 12.0–15.0)
MCH: 25.3 pg — ABNORMAL LOW (ref 26.0–34.0)
MCHC: 32.2 g/dL (ref 30.0–36.0)
MCV: 78.6 fL (ref 78.0–100.0)
PLATELETS: 345 10*3/uL (ref 150–400)
RBC: 4.62 MIL/uL (ref 3.87–5.11)
RDW: 13 % (ref 11.5–15.5)
WBC: 10.2 10*3/uL (ref 4.0–10.5)

## 2014-12-14 LAB — URINALYSIS, ROUTINE W REFLEX MICROSCOPIC
Bilirubin Urine: NEGATIVE
Glucose, UA: NEGATIVE mg/dL
KETONES UR: 15 mg/dL — AB
LEUKOCYTES UA: NEGATIVE
NITRITE: NEGATIVE
PH: 5.5 (ref 5.0–8.0)
PROTEIN: NEGATIVE mg/dL
Specific Gravity, Urine: 1.024 (ref 1.005–1.030)
UROBILINOGEN UA: 0.2 mg/dL (ref 0.0–1.0)

## 2014-12-14 NOTE — ED Notes (Signed)
Pt reports to the ED for eval of abd pain and bloody diarrhea. She reports last week she was dx with ulcerative colitis after a colonoscopy 10/14. Received dx on on 10/19. She started taking Lialda. She reports that on Thursday night she developed the bloody diarrhea and it has been getting progressively worse. Pt A&Ox4, resp e/u, and skin warm and dry.

## 2014-12-15 ENCOUNTER — Encounter: Payer: Self-pay | Admitting: Family Medicine

## 2014-12-15 ENCOUNTER — Telehealth: Payer: Self-pay | Admitting: Gastroenterology

## 2014-12-15 ENCOUNTER — Ambulatory Visit (INDEPENDENT_AMBULATORY_CARE_PROVIDER_SITE_OTHER): Payer: BC Managed Care – PPO | Admitting: Family Medicine

## 2014-12-15 VITALS — BP 130/64 | HR 78 | Temp 98.4°F | Resp 16 | Ht 66.0 in | Wt 215.0 lb

## 2014-12-15 DIAGNOSIS — F4329 Adjustment disorder with other symptoms: Secondary | ICD-10-CM | POA: Diagnosis not present

## 2014-12-15 DIAGNOSIS — F418 Other specified anxiety disorders: Secondary | ICD-10-CM | POA: Insufficient documentation

## 2014-12-15 DIAGNOSIS — K51011 Ulcerative (chronic) pancolitis with rectal bleeding: Secondary | ICD-10-CM

## 2014-12-15 DIAGNOSIS — K529 Noninfective gastroenteritis and colitis, unspecified: Secondary | ICD-10-CM | POA: Insufficient documentation

## 2014-12-15 DIAGNOSIS — K51211 Ulcerative (chronic) proctitis with rectal bleeding: Secondary | ICD-10-CM

## 2014-12-15 LAB — LIPASE, BLOOD: LIPASE: 22 U/L (ref 11–51)

## 2014-12-15 LAB — OCCULT BLOOD X 1 CARD TO LAB, STOOL: Fecal Occult Bld: POSITIVE — AB

## 2014-12-15 MED ORDER — MORPHINE SULFATE (PF) 4 MG/ML IV SOLN
4.0000 mg | Freq: Once | INTRAVENOUS | Status: AC
  Administered 2014-12-15: 4 mg via INTRAVENOUS
  Filled 2014-12-15: qty 1

## 2014-12-15 MED ORDER — SODIUM CHLORIDE 0.9 % IV BOLUS (SEPSIS)
1000.0000 mL | Freq: Once | INTRAVENOUS | Status: AC
  Administered 2014-12-15: 1000 mL via INTRAVENOUS

## 2014-12-15 MED ORDER — ONDANSETRON HCL 4 MG/2ML IJ SOLN
4.0000 mg | Freq: Once | INTRAMUSCULAR | Status: AC
  Administered 2014-12-15: 4 mg via INTRAVENOUS
  Filled 2014-12-15: qty 2

## 2014-12-15 MED ORDER — HYDROCODONE-ACETAMINOPHEN 5-325 MG PO TABS: 1.0000 | ORAL_TABLET | Freq: Four times a day (QID) | ORAL | Status: DC | PRN

## 2014-12-15 MED ORDER — METHYLPREDNISOLONE SODIUM SUCC 125 MG IJ SOLR
125.0000 mg | Freq: Once | INTRAMUSCULAR | Status: AC
  Administered 2014-12-15: 125 mg via INTRAVENOUS
  Filled 2014-12-15: qty 2

## 2014-12-15 MED ORDER — PREDNISONE 10 MG PO TABS: 20.0000 mg | ORAL_TABLET | Freq: Every day | ORAL | Status: DC

## 2014-12-15 MED ORDER — BUDESONIDE 9 MG PO TB24: ORAL_TABLET | ORAL | Status: DC

## 2014-12-15 NOTE — Telephone Encounter (Signed)
Called patient. She reports since being on Lialda she has felt worse, had flare of diarrhea and blood in the stools. Seen in ED last night, given a dose of IV steroids and sent home with prednisone 20mg  daily. Sounds like she is having a colitis flare however want to rule out C diff to ensure negative. Asked her to go to the lab to submit this. Otherwise, asked that she stop Lialda as she could be having a paradoxical flare from this medication as time course fits. Otherwise recommend she stop prednisone and will treat flare with Uceris 9mg  daily for 6 weeks. In the interim, will obtain TPMT and hepatitis screening, as well as quantiferon gold. I will await labs and her course. May try her on another form of mesalamine however if mesalamine cannot hold her, will need to discuss thiopurines, anti-TNF, or anti-integrins. She agreed. Will await labs  Can you confirm I ordered these labs correctly? I just want to make sure they get drawn correctly. Thanks

## 2014-12-15 NOTE — Telephone Encounter (Signed)
Left message for pt to call back  °

## 2014-12-15 NOTE — Progress Notes (Signed)
Patient ID: Theresa BoeckDesiree Farmer, female   DOB: [birth year], 34 y.o.   MRN: [id number]   Subjective:    Patient ID: Theresa Farmer, female    DOB: [birth year], 34 y.o.   MRN: [id number]  Patient presents for ER F/U  patient here to follow-up emergency room. She was recent diagnosis with pain colitis concern for ulcerative colitis 12/04/14. She continues to have increased episodes of severe cramping as well as watery stools. Throughout the weekend her symptoms worsen. She tried to go to school yesterday and refrain from eating because every time she she will have severe cramping as well as all watery stool. Her back pain became so that yesterday that she went to the emergency room. She was given IV Solu-Medrol and sent home with prescription for prednisone and hydrocodone which she has not picked up this morning. Has not had a nausea vomiting associated. She knows that things are worse when she is very stressed out. She has a lot of stress at work and also juggling her family time. She's had some headaches for the past couple weeks as well almost daily and feels very overwhelmed. She is concerned about her job and requests some time off this week to get her medication in for her bowels. Note she was increased on Lialda recently by gastric neurology after she had her colonoscopy done.    Review Of Systems:  GEN- denies fatigue, fever, weight loss,weakness, recent illness HEENT- denies eye drainage, change in vision, nasal discharge, CVS- denies chest pain, palpitations RESP- denies SOB, cough, wheeze ABD- denies N/V, +change in stools, +abd pain GU- denies dysuria, hematuria, dribbling, incontinence MSK- denies joint pain, muscle aches, injury Neuro- denies headache, dizziness, syncope, seizure activity       Objective:    BP 130/64 mmHg  Pulse 78  Temp(Src) 98.4 F (36.9 C) (Oral)  Resp 16  Ht 5\' 6"  (1.676 m)  Wt 215 lb (97.523 kg)  BMI 34.72 kg/m2  LMP 11/20/2014 GEN- NAD, alert and oriented  x3 HEENT- PERRL, EOMI, non injected sclera, pink conjunctiva, MMM, oropharynx clear Neck- Supple, no thyromegaly CVS- RRR, no murmur RESP-CTAB ABD-NABS,soft,TTP lower quadrants Psych- tearful, not anxious appearing, stressed and tired appearing EXT- No edema Pulses- Radial, DP- 2+        Assessment & Plan:      Problem List Items Addressed This Visit    UC (ulcerative colitis) (HCC) - Primary    Newly diagnosed with worsening symptoms in setting of heavy stress load at work. She was able to eat last night and this AM with only 1 small watery stool, hopefully the prednisone will kick in, in addition to the Lialda that GI gave. Will send her Gastroenterologist a note as well. I recommend she be out of work this week, to get her tolerating food, drinking fluid and allow her body to rest as the medications take affect. 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 Stress is also contributing to the fatigue, headaches that she is expericing . I am not going to start any other meds at this time and work on the acute GI flare      Stress and adjustment reaction      Note: This dictation was prepared with Dragon dictation along with smaller phrase technology. Any transcriptional errors that result from this process are unintentional.

## 2014-12-15 NOTE — Assessment & Plan Note (Addendum)
Newly diagnosed with worsening symptoms in setting of heavy stress load at work. She was able to eat last night and this AM with only 1 small watery stool, hopefully the prednisone will kick in, in addition to the Lialda that GI gave. Will send her Gastroenterologist a note as well. I recommend she be out of work this week, to get her tolerating food, drinking fluid and allow her body to rest as the medications take affect. 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 Stress is also contributing to the fatigue, headaches that she is expericing . I am not going to start any other meds at this time and work on the acute GI flare

## 2014-12-15 NOTE — ED Provider Notes (Signed)
After receiving pain medication, steroids and IV fluids.  Patient states she is feeling better and thinks that she can manage at home.  If she has prescriptions for same.  She does have a gastroenterologist that she can follow-up of instructed her to call them in the next 1-2 days to report.  Tonight's visit and prescribed medications  Earley FavorGail Sterling Mondo, NP 12/15/14 [id number]  Donnetta HutchingBrian Cook, MD 12/17/14 1356

## 2014-12-15 NOTE — Telephone Encounter (Signed)
Pt wanted to let Dr. Adela LankArmbruster know that she was seen in the ER last night for stomach pain, diarrhea and dark tarry stools. Pt was seen by PCP today and given prednisone and some pain medication. Pt wanted to make sure Dr. Adela LankArmbruster was aware.

## 2014-12-15 NOTE — Patient Instructions (Addendum)
Take the prednisone as prescribed Take pain medication  I will call your GI doctor Parke Simmers diet    Ulcerative Colitis, Adult Ulcerative colitis is long-lasting (chronic) swelling (inflammation) of the large intestine (colon). Sores (ulcers) may also form on the colon.  Ulcerative colitis is closely related to another condition of inflammation of the intestines that is called Crohn disease. Together, they are frequently referred to as inflammatory bowel disease (IBD).  CAUSES  Ulcerative colitis is caused by increased activity of the immune system in the intestines. The immune system is the system that protects the body against harmful bacteria, viruses, fungi, and other things that can make you sick. When the immune system overacts, it causes inflammation. The cause of the increased immune system activity is not known.  RISK FACTORS Risk factors of ulcerative colitis include:   Age. This includes:  Being 109-15 years old.  Being older than 33 years old.  Having a family history of ulcerative colitis.   Being of Jewish descent. SIGNS AND SYMPTOMS  Common symptoms of ulcerative colitis include rectal bleeding and diarrhea. There is a wide range of symptoms, and a person's symptoms depend on how severe the condition is. Additional symptoms may include:   Pain or cramping in the belly (abdomen).   Fever.   Fatigue.   Weight loss.   Night sweats.   Rectal pain.   Feeling the immediate need to have a bowel movement.   Nausea.  Loss of appetite.  Anemia.  Joint pain or soreness.  Eye irritation.  Certain skin rashes. DIAGNOSIS  Ulcerative colitis may be diagnosed by:  Medical history and physical exam.  Blood tests and stool tests.  X-rays.  CT scans.  Colonoscopy. For this test, a flexible tube is inserted into your anus and your colon is examined.  Examination of a tissue sample from your colon (biopsy). TREATMENT  Treatment for ulcerative colitis may  include medicines to:  Decrease inflammation.  Control your immune system. Surgery may also be necessary.  HOME CARE INSTRUCTIONS  Medicines and Vitamins  Take medicines only as directed by your doctor. Do not take aspirin.  Ask your doctor if you should take any vitamins or supplements. Lifestyle  Exercise regularly.  Limit alcohol intake to no more than 1 drink per day for nonpregnant women and 2 drinks per day for men. One drink equals 12 ounces of beer, 5 ounces of wine, or 1 ounces of hard liquor. Eating and Drinking  Drink enough fluid to keep your urine clear or pale yellow.  Ask your health care provider about the best diet for you. Follow the diet as directed by your health care provider. This may include:  Avoiding carbonated drinks.  Avoiding popcorn, vegetable skins, nuts, and other high-fiber foods when you have symptoms of ulcerative colitis.  Eating smaller meals more often.  Keeping a food diary. This may help you to find and avoid any foods that make you feel not well.  Limit your caffeine intake. General Instructions  Keep all follow-up appointments as directed by your health care provider. This is important. SEEK MEDICAL CARE IF:   Your symptoms do not improve or get worse with treatment.   You continue to lose weight.  You have constant cramps or loose bowels.   You develop a new skin rash, skin sores, or eye problems.  You have a fever or chills. SEEK IMMEDIATE MEDICAL CARE IF:   You have bloody diarrhea.   You have severe pain in your  abdomen.   You vomit.   This information is not intended to replace advice given to you by your health care provider. Make sure you discuss any questions you have with your health care provider.   Document Released: 11/16/2004 Document Revised: 10/28/2014 Document Reviewed: 09/24/2013 Elsevier Interactive Patient Education [employer]2016 [employer]vier Inc.

## 2014-12-15 NOTE — Discharge Instructions (Signed)
Abdominal Pain, Adult Many things can cause belly (abdominal) pain. Most times, the belly pain is not dangerous. Many cases of belly pain can be watched and treated at home. HOME CARE   Do not take medicines that help you go poop (laxatives) unless told to by your doctor.  Only take medicine as told by your doctor.  Eat or drink as told by your doctor. Your doctor will tell you if you should be on a special diet. GET HELP IF:  You do not know what is causing your belly pain.  You have belly pain while you are sick to your stomach (nauseous) or have runny poop (diarrhea).  You have pain while you pee or poop.  Your belly pain wakes you up at night.  You have belly pain that gets worse or better when you eat.  You have belly pain that gets worse when you eat fatty foods.  You have a fever. GET HELP RIGHT AWAY IF:   The pain does not go away within 2 hours.  You keep throwing up (vomiting).  The pain changes and is only in the right or left part of the belly.  You have bloody or tarry looking poop. MAKE SURE YOU:   Understand these instructions.  Will watch your condition.  Will get help right away if you are not doing well or get worse.   This information is not intended to replace advice given to you by your health care provider. Make sure you discuss any questions you have with your health care provider.   Document Released: 07/26/2007 Document Revised: 02/27/2014 Document Reviewed: 10/16/2012 Elsevier Interactive Patient Education [employer] Elsevier Inc. Follow up with your Gastroenterologist by phone in the next 1-2 days  Return if you can not control pain at home with medications prescribed tonight

## 2014-12-15 NOTE — ED Provider Notes (Signed)
CSN: [id number]     Arrival date & time 12/14/14  2005 History   First MD Initiated Contact with Patient 12/14/14 2358     Chief Complaint  Patient presents with  . Abdominal Pain     (Consider location/radiation/quality/duration/timing/severity/associated sxs/prior Treatment) HPI..... Generalized abdominal pain for several weeks.  Status post colonoscopy on 12/04/2014 with subsequent diagnosis of inflammatory bowel disease and pancolitis. She was initially started on Lialda Dr 1.2 g  two tablets daily.  Her doses been doubled to 4 tablets daily. She has cramping pain and black stool. Several episodes of diarrhea today. 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 Gastritis   . IBS (irritable bowel syndrome)   . Ulcerative colitis [facility])    Past Surgical History  Procedure Laterality Date  . Tubal ligation     Family History  Problem Relation Age of Onset  . Colon cancer      cousins and uncles  . Breast cancer Maternal Grandmother   . Diabetes      several maternal relatives  . Hypertension      several paternal and maternal relatives  . Irritable bowel syndrome      mother   Social History  Substance Use Topics  . Smoking status: Never Smoker   . Smokeless tobacco: Never Used  . Alcohol Use: 1.2 oz/week    2 Standard drinks or equivalent per week     Comment: occasionally   OB History    No data available     Review of Systems  All other systems reviewed and are negative.     Allergies  Keflex  Home Medications   Prior to Admission medications   Medication Sig Start Date End Date Taking? Authorizing Provider  cetirizine (ZYRTEC) 10 MG tablet Take 10 mg by mouth daily as needed for allergies.    Historical Provider, MD  ferrous sulfate 325 (65 FE) MG tablet TAKE 1 TABLET BY MOUTH TWICE A DAY 11/12/14   Salley Scarlet, MD  fluticasone [facility]) 50 MCG/ACT nasal spray Place  1 spray into both nostrils daily as needed for allergies.     Historical Provider, MD  mesalamine (LIALDA) 1.2 G EC tablet Take 4 tablets (4.8 g total) by mouth daily with breakfast. 12/10/14   Ruffin Frederick, MD  norethindrone-ethinyl estradiol (JUNEL FE,GILDESS FE,LOESTRIN FE) 1-20 MG-MCG tablet Take 1 tablet by mouth daily.    Historical Provider, MD   BP 155/83 mmHg  Pulse 98  Temp(Src) 99.1 F (37.3 C) (Oral)  Resp 20  Ht  (1.676 m)  Wt 212 lb (96.163 kg)  BMI 34.23 kg/m2  SpO2 99%  LMP 11/20/2014 Physical Exam  Constitutional: She is oriented to person, place, and time. She appears well-developed and well-nourished.  HENT:  Head: Normocephalic and atraumatic.  Eyes: Conjunctivae and EOM are normal. Pupils are equal, round, and reactive to light.  Neck: Normal range of motion. Neck supple.  Cardiovascular: Normal rate and regular rhythm.   Pulmonary/Chest: Effort normal and breath sounds normal.  Abdominal: Soft. Bowel sounds are normal.  Minimal generalized tenderness  Genitourinary:  Rectal: No masses heme-negative  Musculoskeletal: Normal range of motion.  Neurological: She is alert and oriented to person, place, and time.  Skin: Skin is warm and dry.  Psychiatric: She has a normal mood and affect. Her behavior is normal.  Nursing note and vitals reviewed.  ED Course  Procedures (including critical care time) Labs Review Labs Reviewed  COMPREHENSIVE METABOLIC PANEL - Abnormal; Notable for the following:    Sodium 134 (*)    Potassium 3.1 (*)    Glucose, Bld 125 (*)    BUN <5 (*)    Calcium 8.8 (*)    Albumin 3.4 (*)    ALT 13 (*)    All other components within normal limits  CBC - Abnormal; Notable for the following:    Hemoglobin 11.7 (*)    MCH 25.3 (*)    All other components within normal limits  URINALYSIS, ROUTINE W REFLEX MICROSCOPIC (NOT AT [facility]) - Abnormal; Notable for the following:    Color, Urine AMBER (*)    APPearance CLOUDY (*)     Hgb urine dipstick MODERATE (*)    Ketones, ur 15 (*)    All other components within normal limits  URINE MICROSCOPIC-ADD ON - Abnormal; Notable for the following:    Casts HYALINE CASTS (*)    All other components within normal limits  LIPASE, BLOOD  OCCULT BLOOD X 1 CARD TO LAB, STOOL    Imaging Review No results found. I have personally reviewed and evaluated these images and lab results as part of my medical decision-making.   EKG Interpretation None      MDM   Final diagnoses:  Abdominal pain, unspecified abdominal location    Will start IV fluids, pain medicine, IV steroids. Plan to discharge home with prednisone and pain medication. She has gastroenterology follow-up.    Donnetta HutchingBrian Shaden Higley, MD 12/15/14 0040

## 2014-12-16 ENCOUNTER — Other Ambulatory Visit: Payer: BC Managed Care – PPO

## 2014-12-16 ENCOUNTER — Other Ambulatory Visit: Payer: Self-pay

## 2014-12-16 DIAGNOSIS — K51819 Other ulcerative colitis with unspecified complications: Secondary | ICD-10-CM

## 2014-12-16 NOTE — Telephone Encounter (Signed)
Labs reordered and made future so lab can see the orders.

## 2014-12-17 LAB — HEPATITIS B SURFACE ANTIGEN: HEP B S AG: NEGATIVE

## 2014-12-17 LAB — HEPATITIS C ANTIBODY: HCV Ab: NEGATIVE

## 2014-12-21 LAB — QUANTIFERON TB GOLD ASSAY (BLOOD)
MITOGEN VALUE: 0.48 [IU]/mL
QUANTIFERON TB AG MINUS NIL: -0.02 [IU]/mL
Quantiferon Nil Value: 0.19 IU/mL
TB Ag value: 0.17 IU/mL

## 2014-12-22 ENCOUNTER — Other Ambulatory Visit: Payer: Self-pay | Admitting: *Deleted

## 2014-12-22 DIAGNOSIS — K51219 Ulcerative (chronic) proctitis with unspecified complications: Secondary | ICD-10-CM

## 2014-12-24 ENCOUNTER — Telehealth: Payer: Self-pay | Admitting: Family Medicine

## 2014-12-24 ENCOUNTER — Other Ambulatory Visit: Payer: Self-pay

## 2014-12-24 DIAGNOSIS — K5190 Ulcerative colitis, unspecified, without complications: Secondary | ICD-10-CM

## 2014-12-24 LAB — THIOPURINE METHYLTRANSFERASE (TPMT), RBC: THIOPURINE METHYLTRANSFERASE, RBC: 10 nmol/h/mL — AB

## 2014-12-24 NOTE — Telephone Encounter (Signed)
Routed fmla ppw to sabrina on 12-24-2014 at 9:10am

## 2014-12-25 NOTE — Telephone Encounter (Signed)
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 Jeanice Limurham

## 2014-12-28 ENCOUNTER — Other Ambulatory Visit: Payer: Self-pay

## 2014-12-28 ENCOUNTER — Other Ambulatory Visit: Payer: BC Managed Care – PPO

## 2014-12-28 ENCOUNTER — Ambulatory Visit (INDEPENDENT_AMBULATORY_CARE_PROVIDER_SITE_OTHER)
Admission: RE | Admit: 2014-12-28 | Discharge: 2014-12-28 | Disposition: A | Discharge status: Home / Self Care | Payer: BC Managed Care – PPO | Source: Ambulatory Visit | Attending: Gastroenterology | Admitting: Gastroenterology

## 2014-12-28 DIAGNOSIS — K5190 Ulcerative colitis, unspecified, without complications: Secondary | ICD-10-CM

## 2014-12-28 DIAGNOSIS — K5100 Ulcerative (chronic) pancolitis without complications: Secondary | ICD-10-CM

## 2014-12-29 ENCOUNTER — Ambulatory Visit (INDEPENDENT_AMBULATORY_CARE_PROVIDER_SITE_OTHER): Payer: BC Managed Care – PPO | Admitting: Internal Medicine

## 2014-12-29 DIAGNOSIS — K5100 Ulcerative (chronic) pancolitis without complications: Secondary | ICD-10-CM

## 2014-12-29 LAB — HEPATITIS B SURFACE ANTIBODY,QUALITATIVE: Hep B S Ab: POSITIVE — AB

## 2014-12-29 LAB — HEPATITIS C ANTIBODY: HCV Ab: NEGATIVE

## 2014-12-29 NOTE — Progress Notes (Signed)
Patient ID: Theresa Farmer, female   DOB: 1980-03-03, 34 y.o.   MRN: [id number] Put under Doc of the day's schedule because Dr Adela LankArmbruster off and epic wouldn't allow it to be put on his.

## 2014-12-29 NOTE — Telephone Encounter (Signed)
Received FMLA ppw on my desk filled out by provider, pt aware of form fee and will come today to pick up paper.   Copy has been made and sent to scan.

## 2014-12-30 LAB — QUANTIFERON TB GOLD ASSAY (BLOOD)
INTERFERON GAMMA RELEASE ASSAY: NEGATIVE
MITOGEN VALUE: 0.56 [IU]/mL
QUANTIFERON NIL VALUE: 0.02 [IU]/mL
QUANTIFERON TB AG MINUS NIL: 0 [IU]/mL
TB AG VALUE: 0.02 [IU]/mL

## 2015-01-01 LAB — THIOPURINE METHYLTRANSFERASE (TPMT), RBC: Thiopurine Methyltransferase, RBC: 10 nmol/hr/mL RBC — ABNORMAL LOW

## 2015-01-05 ENCOUNTER — Telehealth: Payer: Self-pay

## 2015-01-05 LAB — TB SKIN TEST
Induration: 0 mm
TB SKIN TEST: NEGATIVE

## 2015-01-05 NOTE — Telephone Encounter (Signed)
Had trouble with epic - was not able to input results of tb test given 12/29/2014.    Test was negative.  No induration

## 2015-01-23 ENCOUNTER — Other Ambulatory Visit: Payer: Self-pay | Admitting: Gastroenterology

## 2015-01-25 ENCOUNTER — Ambulatory Visit: Payer: BC Managed Care – PPO | Admitting: Gastroenterology

## 2015-01-26 ENCOUNTER — Encounter: Payer: Self-pay | Admitting: Physician Assistant

## 2015-01-26 ENCOUNTER — Other Ambulatory Visit (INDEPENDENT_AMBULATORY_CARE_PROVIDER_SITE_OTHER): Payer: BC Managed Care – PPO

## 2015-01-26 ENCOUNTER — Other Ambulatory Visit: Payer: Self-pay | Admitting: *Deleted

## 2015-01-26 ENCOUNTER — Ambulatory Visit (INDEPENDENT_AMBULATORY_CARE_PROVIDER_SITE_OTHER): Payer: BC Managed Care – PPO | Admitting: Physician Assistant

## 2015-01-26 VITALS — BP 116/70 | HR 80 | Ht 66.0 in | Wt 211.2 lb

## 2015-01-26 DIAGNOSIS — K51211 Ulcerative (chronic) proctitis with rectal bleeding: Secondary | ICD-10-CM

## 2015-01-26 DIAGNOSIS — D509 Iron deficiency anemia, unspecified: Secondary | ICD-10-CM

## 2015-01-26 DIAGNOSIS — K5100 Ulcerative (chronic) pancolitis without complications: Secondary | ICD-10-CM | POA: Diagnosis not present

## 2015-01-26 LAB — CBC WITH DIFFERENTIAL/PLATELET
BASOS ABS: 0 10*3/uL (ref 0.0–0.1)
Basophils Relative: 0.2 % (ref 0.0–3.0)
EOS ABS: 0 10*3/uL (ref 0.0–0.7)
EOS PCT: 0.4 % (ref 0.0–5.0)
HCT: 37.8 % (ref 36.0–46.0)
HEMOGLOBIN: 12.2 g/dL (ref 12.0–15.0)
LYMPHS ABS: 2.8 10*3/uL (ref 0.7–4.0)
Lymphocytes Relative: 23.9 % (ref 12.0–46.0)
MCHC: 32.2 g/dL (ref 30.0–36.0)
MCV: 78.7 fl (ref 78.0–100.0)
MONO ABS: 1.2 10*3/uL — AB (ref 0.1–1.0)
Monocytes Relative: 10 % (ref 3.0–12.0)
NEUTROS PCT: 65.5 % (ref 43.0–77.0)
Neutro Abs: 7.6 10*3/uL (ref 1.4–7.7)
Platelets: 401 10*3/uL — ABNORMAL HIGH (ref 150.0–400.0)
RBC: 4.81 Mil/uL (ref 3.87–5.11)
RDW: 13.9 % (ref 11.5–15.5)
WBC: 11.6 10*3/uL — AB (ref 4.0–10.5)

## 2015-01-26 LAB — IBC PANEL
IRON: 23 ug/dL — AB (ref 42–145)
Saturation Ratios: 6.5 % — ABNORMAL LOW (ref 20.0–50.0)
TRANSFERRIN: 251 mg/dL (ref 212.0–360.0)

## 2015-01-26 LAB — FERRITIN: FERRITIN: 28.6 ng/mL (ref 10.0–291.0)

## 2015-01-26 MED ORDER — DICYCLOMINE HCL 10 MG PO CAPS: 10.0000 mg | ORAL_CAPSULE | Freq: Three times a day (TID) | ORAL | Status: DC | PRN

## 2015-01-26 MED ORDER — METRONIDAZOLE 250 MG PO TABS: 250.0000 mg | ORAL_TABLET | Freq: Three times a day (TID) | ORAL | Status: DC

## 2015-01-26 MED ORDER — BUDESONIDE 9 MG PO TB24: ORAL_TABLET | ORAL | Status: DC

## 2015-01-26 NOTE — Patient Instructions (Addendum)
We have sent the following medications to your pharmacy for you to pick up at your convenience. Bentyl, Uceris and Flagyl  Your physician has requested that you go to the basement for the lab work before leaving today.  Follow up with Dr Adela LankArmbruster on 02-04-2015 @ 3:45pm  Continue oral Iron.   Continue Uceris.

## 2015-01-26 NOTE — Progress Notes (Signed)
Patient ID: Theresa Farmer, female   DOB: 1980-07-19, 34 y.o.   MRN: [id number]     History of Present Illness: Theresa Farmer is a delightful 34 year old female who was recently evaluated for rectal bleeding. She underwent a colonoscopy on 12/04/2014:ENDOSCOPIC IMPRESSION: Pancolitis as described above, with most severe changes in transverse and right colon, with mild changes in the rectum. Biopsies taken to rule out IBD. Normal ileum. Differential includes most likely IBD (favor ulcerative colitis) or NSAID enteropathy vs. less likely bowel preparation artifact. RECOMMENDATIONS: Start trial of mesalamine Resume diet Resume medications NO NSAIDs Await pathology results. You will be contacted with pathology results when available. Biopsies favored ulcerative colitis. She was started on [employer] 2.4 g daily which initially did not make much of a difference and so she was increased to 4.8 g daily. She was seen in the emergency room on October 25 with abdominal pain . She was given a dose of IV steroids and sent home on prednisone 20 mg daily. Stool for C. difficile was obtained and was negative. She was advised to discontinue her Lialda. She called the office and her prednisone was discontinued and she was started on uceris 9 mg daily for 6 weeks. Hepatitis screening was obtained and she was noted to be immune to hepatitis B and her hepatitis C was negative. Hepatitis A was not done. On interferon TB cold was indeterminate. Chest x-ray was normal. TB skin test was negative. TP MT was obtained and she was noted to be a low metabolizer. She returns today for follow-up. She states initially on use ferrous she was feeling better but for the last few days has been having increasing cramping. She is having 3 loose bowel movements each morning. She's had no blood, mucus, or tenesmus. She has no nausea or vomiting. She does feel that she has excess gas and flatulence and is very bloated.  Past Medical History    Diagnosis Date  . Gastritis   . IBS (irritable bowel syndrome)   . Ulcerative colitis [facility])     Past Surgical History  Procedure Laterality Date  . Tubal ligation     Family History  Problem Relation Age of Onset  . Colon cancer      cousins and uncles  . Breast cancer Maternal Grandmother   . Diabetes      several maternal relatives  . Hypertension      several paternal and maternal relatives  . Irritable bowel syndrome      mother   Social History  Substance Use Topics  . Smoking status: Never Smoker   . Smokeless tobacco: Never Used  . Alcohol Use: 1.2 oz/week    2 Standard drinks or equivalent per week     Comment: occasionally   Current Outpatient Prescriptions  Medication Sig Dispense Refill  . Budesonide (UCERIS) 9 MG TB24 Uceris  tablet - 1 tab po q day 30 tablet 1  . cetirizine (ZYRTEC) 10 MG tablet Take 10 mg by mouth daily as needed for allergies.    . ferrous sulfate 325 (65 FE) MG tablet TAKE 1 TABLET BY MOUTH TWICE A DAY 60 tablet 3  . fluticasone (FLONASE) 50 MCG/ACT nasal spray Place 1 spray into both nostrils daily as needed for allergies.     Marland Kitchen norethindrone-ethinyl estradiol (JUNEL FE,GILDESS FE,LOESTRIN FE) 1-20 MG-MCG tablet Take 1 tablet by mouth daily.    Marland Kitchen dicyclomine (BENTYL) 10 MG capsule Take 1 capsule (10 mg total) by mouth every 8 (eight)  hours as needed for spasms (for cramps). 90 capsule 3  . metroNIDAZOLE (FLAGYL) 250 MG tablet Take 1 tablet (250 mg total) by mouth 3 (three) times daily. 30 tablet 0   No current facility-administered medications for this visit.   Allergies  Allergen Reactions  . Keflex [Cephalexin]     hives     Review of Systems: Gen: Denies any fever, chills, sweats, anorexia, fatigue, weakness, malaise, weight loss, and sleep disorder CV: Denies chest pain, angina, palpitations, syncope, orthopnea, PND, peripheral edema, and claudication. Resp: Denies dyspnea at rest, dyspnea with exercise, cough, sputum,  wheezing, coughing up blood, and pleurisy. GI: Denies vomiting blood, jaundice, and fecal incontinence.   Denies dysphagia or odynophagia. GU : Denies urinary burning, blood in urine, urinary frequency, urinary hesitancy, nocturnal urination, and urinary incontinence. MS: Denies joint pain, limitation of movement, and swelling, stiffness, low back pain, extremity pain. Denies muscle weakness, cramps, atrophy.  Derm: Denies rash, itching, dry skin, hives, moles, warts, or unhealing ulcers.  Psych: Denies depression, anxiety, memory loss, suicidal ideation, hallucinations, paranoia, and confusion. Heme: Denies bruising, bleeding, and enlarged lymph nodes. Neuro:  Denies any headaches, dizziness, paresthesia Endo:  Denies any problems with DM, thyroid, adrenal  LAB RESULTS: Blood work from 12/16/2014 hepatitis B surface antigen negative, hepatitis B surface antibody positive, hepatitis C antibody negative. TB skin test 01/01/2015 negative. 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   Studies:   Dg Chest 2 View  12/28/2014  CLINICAL DATA:  Change in medication IBD EXAM: CHEST  2 VIEW COMPARISON:  04/09/2012 FINDINGS: The heart size and mediastinal contours are within normal limits. Both lungs are clear. The visualized skeletal structures are unremarkable. IMPRESSION: No active cardiopulmonary disease. Electronically Signed   By: Signa Kellaylor  Stroud M.D.   On: 12/28/2014 08:46     Physical Exam: BP 116/70 mmHg  Pulse 80  Ht 5\' 6"  (1.676 m)  Wt 211 lb 3.2 oz (95.8 kg)  BMI 34.10 kg/m2  LMP 12/20/2014 General: Pleasant, well developed , African-American female in no acute distress Head: Normocephalic and atraumatic Eyes:  sclerae anicteric, conjunctiva pink  Ears: Normal auditory acuity Lungs: Clear throughout to auscultation Heart: Regular rate and rhythm Abdomen: Soft, non distended, non-tender. No masses, no hepatomegaly. Normal bowel sounds Musculoskeletal: Symmetrical with no  gross deformities  Extremities: No edema  Neurological: Alert oriented x 4, grossly nonfocal Psychological:  Alert and cooperative. Normal mood and affect  Assessment and Recommendations:  34 year old female with a new diagnosis of ulcerative colitis, here for follow-up. She is improved on uceris  though is not yet back to her baseline. A considerable amount of time has been spent discussing treatment options including low-dose diet. His given her low TPM T, and anti-TNF medications. Patient has been provided patient information handouts from the [employer]'s [employer]. She will continue her current dose of use serous. She will be given a trial of Bentyl 10 mg 1 by mouth every 8 hours when necessary cramps. She will be given a trial of Flagyl 250 mg 1 by mouth 3 times a day for 10 days. She will follow up in one and a half weeks at which time she will let us know if she is agreeable to proceeding with an anti-TNF medication or immunomodulators. She has been instructed to contact us immediately if she has any problems before then. Hepatitis A total antibody will be obtained and if she is not immune she will be offered Havrix.       Moriah Shawley,  Marqual Mi P PA-C 01/26/2015,

## 2015-01-27 ENCOUNTER — Telehealth: Payer: Self-pay | Admitting: *Deleted

## 2015-01-27 DIAGNOSIS — K51218 Ulcerative (chronic) proctitis with other complication: Secondary | ICD-10-CM

## 2015-01-27 NOTE — Telephone Encounter (Signed)
Left a message for patient to call back. 

## 2015-01-27 NOTE — Telephone Encounter (Signed)
-----   Message from Tollie PizzaLori P Hvozdovic, PA-C sent at 01/26/2015  9:54 PM EST ----- Rene Kocheregina, could to please have patient go for hepatitis A total antibody [place] diagnosis is ulcerative colitis. We are giving her up for Biologics and if she is not immune to hepatitis A we will offer her the vaccine.

## 2015-01-27 NOTE — Progress Notes (Signed)
Agree with assessment and plan. Recommend either low dose thiopurine (low metabolizer) or anti-TNF at this time given failure of mesalamine to control her disease. We can reassess as outlined with plans to start new regimen at next visit.

## 2015-01-29 NOTE — Telephone Encounter (Signed)
Left a message for patient to call back. 

## 2015-02-01 ENCOUNTER — Other Ambulatory Visit: Payer: Self-pay | Admitting: *Deleted

## 2015-02-01 NOTE — Telephone Encounter (Signed)
Spoke with patient and she will come by and have labs drawn.

## 2015-02-01 NOTE — Telephone Encounter (Signed)
Left a message for patient to call back. 

## 2015-02-02 ENCOUNTER — Other Ambulatory Visit: Payer: BC Managed Care – PPO

## 2015-02-02 DIAGNOSIS — K51218 Ulcerative (chronic) proctitis with other complication: Secondary | ICD-10-CM

## 2015-02-03 ENCOUNTER — Telehealth: Payer: Self-pay | Admitting: Gastroenterology

## 2015-02-03 LAB — HEPATITIS A ANTIBODY, TOTAL: Hep A Total Ab: NONREACTIVE

## 2015-02-03 NOTE — Telephone Encounter (Signed)
Left a message for patient to call back. 

## 2015-02-04 ENCOUNTER — Encounter: Payer: Self-pay | Admitting: Gastroenterology

## 2015-02-04 ENCOUNTER — Ambulatory Visit (INDEPENDENT_AMBULATORY_CARE_PROVIDER_SITE_OTHER): Payer: BC Managed Care – PPO | Admitting: Gastroenterology

## 2015-02-04 VITALS — BP 132/72 | HR 70 | Ht 66.0 in | Wt 212.0 lb

## 2015-02-04 DIAGNOSIS — Z23 Encounter for immunization: Secondary | ICD-10-CM

## 2015-02-04 DIAGNOSIS — K5100 Ulcerative (chronic) pancolitis without complications: Secondary | ICD-10-CM | POA: Diagnosis not present

## 2015-02-04 NOTE — Telephone Encounter (Signed)
Left a message for patient to call back. 

## 2015-02-04 NOTE — Progress Notes (Signed)
 HPI :  34 y/o female here for follow up. She previously had a colonoscopy on October 14th for rectal bleeding, showing suspected ulcerative colitis vs. 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 She was placed on Lialda 2.4gm per day which did not help too much, and increased to 4.8gm which did not help too much either and thought she felt worse on this regimen. She was seen in the ED in late October with a flare of symptoms. Given one dose of IV steroids and transitioned to prednisone 20mg. She called the office and I switched her to Uceris. SHe had C diff testing which was negative. 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   On Uceris, she reports overall her bowel symptoms were much better. She has been averaging about 2-3 BMs per day or so, mostly formed. She has some mild abdominal cramping but not significant and short lasting. She feels Uceris made her much better than previous while on Lialda. She reports she did not think Lialda helped much at all, in fact may have made her worse.  She reports recently she has been having some joint pains which have been bothering her. She reports she has pains in her hips and in her elbows, and her neck. She thinks this has been ongoing for a few weeks. She is here to discuss long term options for therapy.   Flu shot - due  Colonscopy 12/04/2014:ENDOSCOPIC IMPRESSION: Pancolitis, with most severe changes in transverse and right colon, with mild changes in the rectum. Normal ileum.    Past Medical History  Diagnosis Date  . Gastritis   . IBS (irritable bowel syndrome)   . Ulcerative colitis (HCC)      Past Surgical History  Procedure Laterality Date  . Tubal ligation     Family History  Problem Relation Age of Onset  . Colon cancer      cousins and uncles  . Breast cancer Maternal Grandmother   . Diabetes      several maternal relatives  . Hypertension      several paternal and maternal  relatives  . Irritable bowel syndrome      mother   Social History  Substance Use Topics  . Smoking status: Never Smoker   . Smokeless tobacco: Never Used  . Alcohol Use: 1.2 oz/week    2 Standard drinks or equivalent per week     Comment: occasionally   Current Outpatient Prescriptions  Medication Sig Dispense Refill  . Budesonide (UCERIS) 9 MG TB24 Uceris 9mg tablet - 1 tab po q day 30 tablet 1  . cetirizine (ZYRTEC) 10 MG tablet Take 10 mg by mouth daily as needed for allergies.    . dicyclomine (BENTYL) 10 MG capsule Take 1 capsule (10 mg total) by mouth every 8 (eight) hours as needed for spasms (for cramps). 90 capsule 3  . ferrous sulfate 325 (65 FE) MG tablet TAKE 1 TABLET BY MOUTH TWICE A DAY 60 tablet 3  . fluticasone (FLONASE) 50 MCG/ACT nasal spray Place 1 spray into both nostrils daily as needed for allergies.     . metroNIDAZOLE (FLAGYL) 250 MG tablet Take 1 tablet (250 mg total) by mouth 3 (three) times daily. 30 tablet 0  . norethindrone-ethinyl estradiol (JUNEL FE,GILDESS FE,LOESTRIN FE) 1-20 MG-MCG tablet Take 1 tablet by mouth daily.     No current facility-administered medications for this visit.   Allergies  Allergen Reactions  .   Keflex [Cephalexin]     hives     Review of Systems: All systems reviewed and negative except where noted in HPI.   Lab Results  Component Value Date   WBC 11.6* 01/26/2015   HGB 12.2 01/26/2015   HCT 37.8 01/26/2015   MCV 78.7 01/26/2015   PLT 401.0* 01/26/2015    Lab Results  Component Value Date   CREATININE 0.96 12/14/2014   BUN <5* 12/14/2014   NA 134* 12/14/2014   K 3.1* 12/14/2014   CL 102 12/14/2014   CO2 23 12/14/2014    Lab Results  Component Value Date   ALT 13* 12/14/2014   AST 18 12/14/2014   ALKPHOS 65 12/14/2014   BILITOT 0.4 12/14/2014   Lab Results  Component Value Date   HAV NON REACTIVE 02/02/2015   Hepatitis B surface AG and hepatitis C AB negative  PPD negative, CXR negative,  quantiferon gold indeterminate   Physical Exam: BP 132/72 mmHg  Pulse 70  Ht 5' 6" (1.676 m)  Wt 212 lb (96.163 kg)  BMI 34.23 kg/m2 Constitutional: Pleasant,well-developed, female in no acute distress. HEENT: Normocephalic and atraumatic. Conjunctivae are normal. No scleral icterus. Neck supple.  Cardiovascular: Normal rate, regular rhythm.  Pulmonary/chest: Effort normal and breath sounds normal. No wheezing, rales or rhonchi. Abdominal: Soft, nondistended, nontender. Bowel sounds active throughout. There are no masses palpable. No hepatomegaly. Extremities: no edema Lymphadenopathy: No cervical adenopathy noted. Neurological: Alert and oriented to person place and time. Skin: Skin is warm and dry. No rashes noted. Psychiatric: Normal mood and affect. Behavior is normal.   ASSESSMENT AND PLAN: 34 y/o female with recent diagnosis of pancolitis, favor UC vs. 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 She was initially placed on Lialda but had paradoxical worsening and seen in the ER with flare of symptoms. She was placed on a course of Uceris and doing much better on this at present time in regards to bowel function. Her main complaint is joint pains today, which may be from underlying IBD.   I have discussed options for therapy moving forward, with the goal of a steroid free remission, with her options to include thiopurines, methotrexate, anti-TNF agents, and anti-integrins. I discussed risks / benefits of each class. I discussed combination therapy as well in regards to risks of immunogenicity with anti-TNF monotherapy. Following a discussion of all of these options, she wished to proceed with Humira (she did not want to come to the hospital for infusions, and did not want to take thiopurines). I have counseled her on the risks of Humira to include infection, injection site reactions, other autoimmune phenomena to include psoriasis, SLE, neurologic symptoms, and the risk of lymphoma. Following a  discussion of risks / benefits she wished to proceed. She will use the starter kit for loading dose and nursing provided instructions.   We will otherwise vaccinate for influenza and for hepatitis A as she is not immune to that. She can stop Uceris once she starts Humira. She can also stop flagyl as she has not noticed any benefit with this.   I would like to see her in clinic in 3 months for follow up, and would like basic labs prior to clinic visit.   She and husband agreed with the plan, all questions answered. She will continue to avoid NSAIDs.   Steven Armbruster, MD [facility] Pager [phone]    

## 2015-02-04 NOTE — Patient Instructions (Signed)
We have sent the following medications to your pharmacy for you to pick up at your convenience: Humira.    .Marland Kitchen

## 2015-02-05 ENCOUNTER — Telehealth: Payer: Self-pay | Admitting: Gastroenterology

## 2015-02-05 NOTE — Telephone Encounter (Signed)
Letter was sent and pt was called.

## 2015-02-05 NOTE — Telephone Encounter (Signed)
Spoke with patient at her OV.

## 2015-02-12 ENCOUNTER — Ambulatory Visit: Payer: BC Managed Care – PPO | Admitting: Gastroenterology

## 2015-02-16 ENCOUNTER — Telehealth: Payer: Self-pay | Admitting: Gastroenterology

## 2015-02-16 ENCOUNTER — Other Ambulatory Visit: Payer: Self-pay | Admitting: Family Medicine

## 2015-02-16 NOTE — Telephone Encounter (Signed)
Left message for patient to call back  

## 2015-02-16 NOTE — Telephone Encounter (Signed)
Refill appropriate and filled per protocol. 

## 2015-02-17 NOTE — Telephone Encounter (Signed)
She's going to be on it every 2 weeks. I would recommend doing the surgery 2 weeks out from her last dose, and her surgeon may want her to hold a dose after the procedure, which is okay.

## 2015-02-17 NOTE — Telephone Encounter (Signed)
Patient is needing to schedule a wisdom tooth extraction.  How long after a Humira injection should she wait to schedule?

## 2015-02-17 NOTE — Telephone Encounter (Signed)
Patient notified  She will call back for any additional questions or concerns 

## 2015-02-23 ENCOUNTER — Telehealth: Payer: Self-pay | Admitting: *Deleted

## 2015-02-23 NOTE — Telephone Encounter (Signed)
Spoke with patient and she has received her Humira and she took her first dose yesterday.

## 2015-03-03 ENCOUNTER — Ambulatory Visit (INDEPENDENT_AMBULATORY_CARE_PROVIDER_SITE_OTHER): Payer: BC Managed Care – PPO | Admitting: Physician Assistant

## 2015-03-03 ENCOUNTER — Encounter: Payer: Self-pay | Admitting: Physician Assistant

## 2015-03-03 ENCOUNTER — Encounter: Payer: Self-pay | Admitting: Family Medicine

## 2015-03-03 VITALS — BP 126/84 | HR 68 | Temp 98.1°F | Resp 18

## 2015-03-03 DIAGNOSIS — M79604 Pain in right leg: Secondary | ICD-10-CM

## 2015-03-03 NOTE — Progress Notes (Signed)
    Patient ID: Theresa BoeckDesiree Farmer MRN: [id number], DOB: 07/11/1980, 35 y.o. Date of Encounter: 03/03/2015, 3:13 PM    Chief Complaint:  Chief Complaint  Patient presents with  . right leg injury    slipped on ice this morning     HPI: 35 y.o. year old AA female presents with above.   Says injury occurred at 9:00 am this morning.  Says she slipped on ice and "Like a split--Right leg went back, left leg went forward."  Also, from what she says, sounds like she may have hyperextended the right knee.   Says that even sitting in chair--she has pain from right heel up to right anterolateral knee/shin area.  Says she tried to prop foot on ottoman---felt a lot of pain on posterior aspect of knee in that position. Says when she tries to walk, she has to keep that knee straight. Hurts/ tight when tries to bend knee to walk.   Works as a Runner, broadcasting/film/videoTeacher. Unable to work today.   Says this happened at 9 this morning and is getting worse. Pain and tightness increasing.     Home Meds:   Outpatient Prescriptions Prior to Visit  Medication Sig Dispense Refill  . cetirizine (ZYRTEC) 10 MG tablet Take 10 mg by mouth daily as needed for allergies.    . ferrous sulfate 325 (65 FE) MG tablet TAKE 1 TABLET BY MOUTH TWICE A DAY 60 tablet 0  . fluticasone (FLONASE) 50 MCG/ACT nasal spray Place 1 spray into both nostrils daily as needed for allergies.     Marland Kitchen. norethindrone-ethinyl estradiol (JUNEL FE,GILDESS FE,LOESTRIN FE) 1-20 MG-MCG tablet Take 1 tablet by mouth daily.    Marland Kitchen. dicyclomine (BENTYL) 10 MG capsule Take 1 capsule (10 mg total) by mouth every 8 (eight) hours as needed for spasms (for cramps). (Patient not taking: Reported on 03/03/2015) 90 capsule 3  . Budesonide (UCERIS) 9 MG TB24 Uceris 9mg  tablet - 1 tab po q day (Patient not taking: Reported on 03/03/2015) 30 tablet 1  . metroNIDAZOLE (FLAGYL) 250 MG tablet Take 1 tablet (250 mg total) by mouth 3 (three) times daily. 30 tablet 0   No  facility-administered medications prior to visit.    Allergies:  Allergies  Allergen Reactions  . Keflex [Cephalexin]     hives      Review of Systems: See HPI for pertinent ROS. All other ROS negative.    Physical Exam: Blood pressure 126/84, pulse 68, temperature 98.1 F (36.7 C), temperature source Oral, resp. rate 18., There is no weight on file to calculate BMI. General:  Overweight AAF. Appears in no acute distress.  Neck: Supple. No thyromegaly. No lymphadenopathy. Lungs: Clear bilaterally to auscultation without wheezes, rales, or rhonchi. Breathing is unlabored. Heart: Regular rhythm. No murmurs, rubs, or gallops. Msk:  Strength and tone normal for age. Right Knee: Inspection normal. Tender with palpation at anterolateral aspect of shin, just below knee. Exam limited secondary to pain.  Extremities/Skin: Warm and dry. Neuro: Alert and oriented X 3. Moves all extremities spontaneously. Gait is normal. CNII-XII grossly in tact. Psych:  Responds to questions appropriately with a normal affect.     ASSESSMENT AND PLAN:  35 y.o. year old female with  1. Right leg pain She needs eval at Orthopedics. Will try to get her into their acute care clinic today.  - Ambulatory referral to Orthopedic Surgery   Signed, Shon HaleMary Beth Gold HillDixon, GeorgiaPA, [facility] 03/03/2015 3:13 PM

## 2015-03-04 ENCOUNTER — Telehealth: Payer: Self-pay | Admitting: Gastroenterology

## 2015-03-04 NOTE — Telephone Encounter (Signed)
                                            Spoke with patient and told her it is ok to take Tramadol.

## 2015-03-18 ENCOUNTER — Telehealth: Payer: Self-pay | Admitting: Gastroenterology

## 2015-03-19 MED ORDER — ADALIMUMAB 40 MG/0.8ML ~~LOC~~ AJKT: 40.0000 mg | AUTO-INJECTOR | SUBCUTANEOUS | Status: DC

## 2015-03-19 NOTE — Telephone Encounter (Signed)
                                Spoke with patient and her insurance changed speciality pharmacies to [facility] this year. She states they need a new rx. Called [phone] and gave them a new rx. Patient aware.

## 2015-04-12 ENCOUNTER — Telehealth: Payer: Self-pay | Admitting: Gastroenterology

## 2015-04-12 NOTE — Telephone Encounter (Signed)
Spoke with patient and she has a respiratory infection/bronchitis. States she is coughing up clear mucous, runny nose and has cold, sweats. Denies fever. She is due to have a Humira shot next Tuesday. She wants to know what she should do.

## 2015-04-12 NOTE — Telephone Encounter (Signed)
Tuesday as in tomorrow? If tomorrow I would hold her dose for a week. If her dose is the following Tuesday, in 8 days, then if she is feeling better okay to proceed. If she is not sure, she should call us prior to her next dose. Is the Humira working? I should see her in clinic for follow up. Thanks

## 2015-04-12 NOTE — Telephone Encounter (Signed)
Spoke with patient and it is next Tuesday she is due. She will call if she is not better then. Scheduled OV on 05/18/15 at 8:45 Am with Dr. Adela Lank.

## 2015-05-18 ENCOUNTER — Ambulatory Visit (INDEPENDENT_AMBULATORY_CARE_PROVIDER_SITE_OTHER): Payer: BC Managed Care – PPO | Admitting: Gastroenterology

## 2015-05-18 ENCOUNTER — Other Ambulatory Visit (INDEPENDENT_AMBULATORY_CARE_PROVIDER_SITE_OTHER): Payer: BC Managed Care – PPO

## 2015-05-18 ENCOUNTER — Encounter: Payer: Self-pay | Admitting: Gastroenterology

## 2015-05-18 VITALS — BP 110/78 | HR 86 | Ht 66.0 in | Wt 223.4 lb

## 2015-05-18 DIAGNOSIS — Z79899 Other long term (current) drug therapy: Secondary | ICD-10-CM | POA: Diagnosis not present

## 2015-05-18 DIAGNOSIS — Z23 Encounter for immunization: Secondary | ICD-10-CM

## 2015-05-18 DIAGNOSIS — K5180 Other ulcerative colitis without complications: Secondary | ICD-10-CM

## 2015-05-18 LAB — CBC WITH DIFFERENTIAL/PLATELET
BASOS PCT: 0.3 % (ref 0.0–3.0)
Basophils Absolute: 0 10*3/uL (ref 0.0–0.1)
EOS PCT: 0.8 % (ref 0.0–5.0)
Eosinophils Absolute: 0.1 10*3/uL (ref 0.0–0.7)
HCT: 39.8 % (ref 36.0–46.0)
HEMOGLOBIN: 13.3 g/dL (ref 12.0–15.0)
Lymphocytes Relative: 29.7 % (ref 12.0–46.0)
Lymphs Abs: 2.9 10*3/uL (ref 0.7–4.0)
MCHC: 33.4 g/dL (ref 30.0–36.0)
MCV: 78.6 fl (ref 78.0–100.0)
MONOS PCT: 5.9 % (ref 3.0–12.0)
Monocytes Absolute: 0.6 10*3/uL (ref 0.1–1.0)
Neutro Abs: 6.1 10*3/uL (ref 1.4–7.7)
Neutrophils Relative %: 63.3 % (ref 43.0–77.0)
Platelets: 321 10*3/uL (ref 150.0–400.0)
RBC: 5.07 Mil/uL (ref 3.87–5.11)
RDW: 13.5 % (ref 11.5–15.5)
WBC: 9.7 10*3/uL (ref 4.0–10.5)

## 2015-05-18 LAB — COMPREHENSIVE METABOLIC PANEL
ALBUMIN: 3.8 g/dL (ref 3.5–5.2)
ALK PHOS: 64 U/L (ref 39–117)
ALT: 17 U/L (ref 0–35)
AST: 14 U/L (ref 0–37)
BUN: 13 mg/dL (ref 6–23)
CALCIUM: 9 mg/dL (ref 8.4–10.5)
CO2: 27 mEq/L (ref 19–32)
Chloride: 104 mEq/L (ref 96–112)
Creatinine, Ser: 0.98 mg/dL (ref 0.40–1.20)
GFR: 83.67 mL/min (ref >60.00)
Glucose, Bld: 93 mg/dL (ref 70–99)
POTASSIUM: 4 meq/L (ref 3.5–5.1)
Sodium: 138 mEq/L (ref 135–145)
TOTAL PROTEIN: 7.5 g/dL (ref 6.0–8.3)
Total Bilirubin: 0.5 mg/dL (ref 0.2–1.2)

## 2015-05-18 LAB — VITAMIN D 25 HYDROXY (VIT D DEFICIENCY, FRACTURES): VITD: 12.45 ng/mL — AB (ref 30.00–100.00)

## 2015-05-18 LAB — HIGH SENSITIVITY CRP: CRP HIGH SENSITIVITY: 13.7 mg/L — AB (ref 0.000–5.000)

## 2015-05-18 LAB — SEDIMENTATION RATE: SED RATE: 34 mm/h — AB (ref 0–22)

## 2015-05-18 NOTE — Patient Instructions (Signed)
Go to the basement for labs today Your next Hepatitis A Vaccine is due on 08/06/2015. We will call you with a reminder We are giving you a pneumovax vaccine today

## 2015-05-18 NOTE — Progress Notes (Signed)
HPI :  LAST VISIT: 35 y/o female here for follow up. She previously had a colonoscopy on October 14th for rectal bleeding, showing suspected ulcerative colitis vs. 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 She was placed on Lialda 2.4gm per day which did not help too much, and increased to 4.8gm which did not help too much either and thought she felt worse on this regimen. She was seen in the ED in late October with a flare of symptoms. Given one dose of IV steroids and transitioned to prednisone '20mg'$ . She called the office and I switched her to Uceris. SHe had C diff testing which was negative. 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   On Uceris, she reports overall her bowel symptoms were much better. She has been averaging about 2-3 BMs per day or so, mostly formed. She has some mild abdominal cramping but not significant and short lasting. She feels Uceris made her much better than previous while on Lialda. She reports she did not think Lialda helped much at all, in fact may have made her worse. She reports recently she has been having some joint pains which have been bothering her. She reports she has pains in her hips and in her elbows, and her neck. She thinks this has been ongoing for a few weeks. She is here to discuss long term options for therapy.   Flu shot - due  Colonscopy 12/04/2014:ENDOSCOPIC IMPRESSION: Pancolitis, with most severe changes in transverse and right colon, with mild changes in the rectum. Normal ileum.  SINCE LAST VISIT:  Patient was placed on Humira since the last visit for her colitis following a discussion of all options.  She is taking this every 2 weeks. She is tolerating the the Humira so far, no side effects that are obvious to her. She has had some hip pain starting about 3 weeks ago, which is new. No other joint pains.  Since being on the Humira she reports has been a bit more regular in her bowel habits. She  is having one BM every other day. She has not had any blood in the stools. No abdominal pains. No weight loss. Overall, she has had benefit from Humira and does feel significant improvement. She has been on Humira since December time frame. She is not taking anything else for her coliits. She had paradoxical worsening when placed on Lialda. She is avoiding NSAIDs. She was previously noted to not be immune to hepatitis A and received the first portion of this vaccine.  IBD Health Care Maintenance: Annual Flu Vaccine - 2016 UTD Pneumococcal Vaccine if receiving immunosuppression: - Date DUE for PVC13 Pap Smear annually if immunosuppressed - Date: DUE  Skin check / derm eval for those on biologic / thiopuriner, yearly: Date  TB testing if on anti-TNF, yearly - Date November 2016 Vitamin D screening - DUE Last Colonoscopy - 12/04/2014     Past Medical History  Diagnosis Date  . Gastritis   . IBS (irritable bowel syndrome)   . Ulcerative colitis [facility])      Past Surgical History  Procedure Laterality Date  . Tubal ligation     Family History  Problem Relation Age of Onset  . Colon cancer      cousins and uncles  . Breast cancer Maternal Grandmother   . Diabetes      several maternal relatives  . Hypertension      several paternal and  maternal relatives  . Irritable bowel syndrome      mother   Social History  Substance Use Topics  . Smoking status: Never Smoker   . Smokeless tobacco: Never Used  . Alcohol Use: 1.2 oz/week    2 Standard drinks or equivalent per week     Comment: occasionally   Current Outpatient Prescriptions  Medication Sig Dispense Refill  . Adalimumab (HUMIRA PEN) 40 MG/0.8ML PNKT Inject 40 mg into the skin every 14 (fourteen) days. 2 each 6  . cetirizine (ZYRTEC) 10 MG tablet Take 10 mg by mouth daily as needed for allergies.    . ferrous sulfate 325 (65 FE) MG tablet TAKE 1 TABLET BY MOUTH TWICE A DAY 60 tablet 0  . fluticasone (FLONASE) 50 MCG/ACT  nasal spray Place 1 spray into both nostrils daily as needed for allergies.     Marland Kitchen norethindrone-ethinyl estradiol (JUNEL FE,GILDESS FE,LOESTRIN FE) 1-20 MG-MCG tablet Take 1 tablet by mouth daily.     No current facility-administered medications for this visit.   Allergies  Allergen Reactions  . Keflex [Cephalexin]     hives     Review of Systems: All systems reviewed and negative except where noted in HPI.   Lab Results  Component Value Date   WBC 9.7 05/18/2015   HGB 13.3 05/18/2015   HCT 39.8 05/18/2015   MCV 78.6 05/18/2015   PLT 321.0 05/18/2015    Lab Results  Component Value Date   ALT 17 05/18/2015   AST 14 05/18/2015   ALKPHOS 64 05/18/2015   BILITOT 0.5 05/18/2015    Lab Results  Component Value Date   CREATININE 0.98 05/18/2015   BUN 13 05/18/2015   NA 138 05/18/2015   K 4.0 05/18/2015   CL 104 05/18/2015   CO2 27 05/18/2015    Lab Results  Component Value Date   ESRSEDRATE 34* 05/18/2015    No results found for: CRP   Physical Exam: BP 110/78 mmHg  Pulse 86  Ht '5\' 6"'$  (1.676 m)  Wt 223 lb 6.4 oz (101.334 kg)  BMI 36.08 kg/m2  SpO2 98% Constitutional: Pleasant,well-developed, female in no acute distress. HEENT: Normocephalic and atraumatic. Conjunctivae are normal. No scleral icterus. Neck supple.  Cardiovascular: Normal rate, regular rhythm.  Pulmonary/chest: Effort normal and breath sounds normal. No wheezing, rales or rhonchi. Abdominal: Soft, nondistended, nontender. Bowel sounds active throughout. There are no masses palpable. Extremities: no edema Lymphadenopathy: No cervical adenopathy noted. Neurological: Alert and oriented to person place and time. Skin: Skin is warm and dry. No rashes noted. Psychiatric: Normal mood and affect. Behavior is normal.   ASSESSMENT AND PLAN: 35 y/o female with recent diagnosis of pancolitis in 2016, favor UC vs. 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 She was initially placed on Lialda but had paradoxical  worsening and seen in the ER with flare of symptoms. She was placed on a course of Uceris and then transitioned to Humira, following a discussion of all options.    This far she has responded positively to Humira - she feels better, tolerating it well. We discussed possible combination immunosuppressive therapy today with adding methotrexate or a thiopurine - we discussed risks / benefits of adding an additional drug vs. Continuing with Humira monotherapy. She wishes to continue monotherapy at this time. I checked CBC, LFTs today which are normal. ESR mildly elevated, will await CRP and fecal calprotectin to see if there is any objective evidence of ongoing inflammation. Otherwise, will continue regimen for now. I would  like to repeat a colonoscopy at some point in time within 9-12 months of starting Humira to see if she is in deep remission. We will discuss this again at her next visit.   Otherwise regarding health care maintenance she is due for a Pneumovacc - PSV13, and will give that today. Otherwise due for follow up hepatitis A vaccine in a few months. I advised her to follow up with her primary care for a PAP smear for which she is due. I will also send vitamin D level with her next blood draw and replete if low.   She and husband agreed with the plan, all questions answered. She will continue to avoid NSAIDs. I will see her in another 3-4 months or so for reassessment.  [place] Cellar, MD [facility] Pager [phone]

## 2015-05-19 ENCOUNTER — Other Ambulatory Visit: Payer: BC Managed Care – PPO

## 2015-05-19 ENCOUNTER — Other Ambulatory Visit: Payer: Self-pay | Admitting: *Deleted

## 2015-05-19 DIAGNOSIS — K5180 Other ulcerative colitis without complications: Secondary | ICD-10-CM

## 2015-05-19 DIAGNOSIS — R7989 Other specified abnormal findings of blood chemistry: Secondary | ICD-10-CM

## 2015-05-19 MED ORDER — VITAMIN D (ERGOCALCIFEROL) 1.25 MG (50000 UNIT) PO CAPS: ORAL_CAPSULE | ORAL | Status: DC

## 2015-05-19 MED ORDER — VITAMIN D (CHOLECALCIFEROL) 25 MCG (1000 UT) PO CAPS: 1000.0000 [IU] | ORAL_CAPSULE | Freq: Every day | ORAL | Status: DC

## 2015-05-25 ENCOUNTER — Other Ambulatory Visit: Payer: Self-pay | Admitting: *Deleted

## 2015-05-25 DIAGNOSIS — K51219 Ulcerative (chronic) proctitis with unspecified complications: Secondary | ICD-10-CM

## 2015-05-25 LAB — CALPROTECTIN, FECAL: CALPROTECTIN, FECAL: 186 ug/g — AB (ref 0–120)

## 2015-06-07 NOTE — Progress Notes (Signed)
Patient ID: Theresa BoeckDesiree Julius, female   DOB: August 09, 1980, 35 y.o.   MRN: [id number]   Received fax from [facility] stating coverage for Humira is approved from 06/04/15-05/24/17

## 2015-06-15 ENCOUNTER — Other Ambulatory Visit: Payer: BC Managed Care – PPO

## 2015-06-15 DIAGNOSIS — K51219 Ulcerative (chronic) proctitis with unspecified complications: Secondary | ICD-10-CM

## 2015-06-19 LAB — SERIAL MONITORING

## 2015-06-20 LAB — ADALIMUMAB+AB (SERIAL MONITOR)
ADALIMUMAB DRUG LEVEL: 2.4 ug/mL
Anti-Adalimumab Antibody: 26 ng/mL

## 2015-06-23 ENCOUNTER — Other Ambulatory Visit: Payer: Self-pay | Admitting: *Deleted

## 2015-06-23 ENCOUNTER — Other Ambulatory Visit: Payer: BC Managed Care – PPO

## 2015-06-23 DIAGNOSIS — K5100 Ulcerative (chronic) pancolitis without complications: Secondary | ICD-10-CM

## 2015-06-23 MED ORDER — ADALIMUMAB 40 MG/0.8ML ~~LOC~~ AJKT: 40.0000 mg | AUTO-INJECTOR | SUBCUTANEOUS | Status: DC

## 2015-06-23 MED ORDER — METHOTREXATE 2.5 MG PO TABS: ORAL_TABLET | ORAL | Status: DC

## 2015-06-23 MED ORDER — FOLIC ACID 1 MG PO TABS: ORAL_TABLET | ORAL | Status: DC

## 2015-06-24 LAB — PREGNANCY, URINE: Preg Test, Ur: NEGATIVE

## 2015-06-29 ENCOUNTER — Other Ambulatory Visit: Payer: Self-pay | Admitting: *Deleted

## 2015-07-12 ENCOUNTER — Other Ambulatory Visit: Payer: Self-pay | Admitting: Gastroenterology

## 2015-07-21 ENCOUNTER — Other Ambulatory Visit (INDEPENDENT_AMBULATORY_CARE_PROVIDER_SITE_OTHER): Payer: BC Managed Care – PPO

## 2015-07-21 DIAGNOSIS — K5180 Other ulcerative colitis without complications: Secondary | ICD-10-CM

## 2015-07-21 LAB — HEPATIC FUNCTION PANEL
ALBUMIN: 3.7 g/dL (ref 3.5–5.2)
ALK PHOS: 61 U/L (ref 39–117)
ALT: 25 U/L (ref 0–35)
AST: 19 U/L (ref 0–37)
BILIRUBIN TOTAL: 0.4 mg/dL (ref 0.2–1.2)
Bilirubin, Direct: 0.1 mg/dL (ref 0.0–0.3)
Total Protein: 6.9 g/dL (ref 6.0–8.3)

## 2015-07-21 LAB — CBC WITH DIFFERENTIAL/PLATELET
BASOS PCT: 0.3 % (ref 0.0–3.0)
Basophils Absolute: 0 10*3/uL (ref 0.0–0.1)
EOS PCT: 1.1 % (ref 0.0–5.0)
Eosinophils Absolute: 0.1 10*3/uL (ref 0.0–0.7)
HCT: 38.7 % (ref 36.0–46.0)
HEMOGLOBIN: 12.7 g/dL (ref 12.0–15.0)
LYMPHS ABS: 2.5 10*3/uL (ref 0.7–4.0)
Lymphocytes Relative: 26.8 % (ref 12.0–46.0)
MCHC: 32.8 g/dL (ref 30.0–36.0)
MCV: 80.6 fl (ref 78.0–100.0)
MONOS PCT: 5.9 % (ref 3.0–12.0)
Monocytes Absolute: 0.6 10*3/uL (ref 0.1–1.0)
NEUTROS PCT: 65.9 % (ref 43.0–77.0)
Neutro Abs: 6.3 10*3/uL (ref 1.4–7.7)
Platelets: 307 10*3/uL (ref 150.0–400.0)
RBC: 4.8 Mil/uL (ref 3.87–5.11)
RDW: 13 % (ref 11.5–15.5)
WBC: 9.5 10*3/uL (ref 4.0–10.5)

## 2015-07-22 ENCOUNTER — Other Ambulatory Visit: Payer: Self-pay | Admitting: *Deleted

## 2015-07-22 DIAGNOSIS — K5100 Ulcerative (chronic) pancolitis without complications: Secondary | ICD-10-CM

## 2015-07-30 ENCOUNTER — Ambulatory Visit (INDEPENDENT_AMBULATORY_CARE_PROVIDER_SITE_OTHER): Payer: BC Managed Care – PPO | Admitting: Family Medicine

## 2015-07-30 ENCOUNTER — Encounter: Payer: Self-pay | Admitting: Family Medicine

## 2015-07-30 VITALS — BP 124/78 | HR 81 | Temp 98.0°F | Resp 18 | Ht 66.0 in | Wt 229.0 lb

## 2015-07-30 DIAGNOSIS — J029 Acute pharyngitis, unspecified: Secondary | ICD-10-CM

## 2015-07-30 LAB — STREP GROUP A AG, W/REFLEX TO CULT: STREGTOCOCCUS GROUP A AG SCREEN: NOT DETECTED

## 2015-07-30 NOTE — Progress Notes (Signed)
Subjective:    Patient ID: Theresa Farmer, female    DOB: 1980-10-11, 35 y.o.   MRN: [id number]  HPI Patient is a 35 year old African-American female with a history of ulcerative colitis on immunosuppressive therapy. She reports a 2 to three-day history of body aches, subjective fevers and chills, sore throat, postnasal drip and congestion and a dull headache. There are no objective fevers. She denies any cough. She does have some nausea but no vomiting or diarrhea. 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 Physical exam is remarkable only for erythema in the posterior oropharynx Past Medical History  Diagnosis Date  . Gastritis   . IBS (irritable bowel syndrome)   . Ulcerative colitis [facility])    Past Surgical History  Procedure Laterality Date  . Tubal ligation     Current Outpatient Prescriptions on File Prior to Visit  Medication Sig Dispense Refill  . Adalimumab (HUMIRA PEN) 40 MG/0.8ML PNKT Inject 40 mg into the skin once a week. 4 each 6  . cetirizine (ZYRTEC) 10 MG tablet Take 10 mg by mouth daily as needed for allergies.    . ferrous sulfate 325 (65 FE) MG tablet TAKE 1 TABLET BY MOUTH TWICE A DAY 60 tablet 0  . fluticasone (FLONASE) 50 MCG/ACT nasal spray Place 1 spray into both nostrils daily as needed for allergies.     . folic acid (FOLVITE) 1 MG tablet Take one po daily when starts Methotrexate 30 tablet 3  . methotrexate (RHEUMATREX) 2.5 MG tablet Take 15 mg po weekly.(6 tablets)Caution:Chemotherapy. Protect from light. 24 tablet 1   No current facility-administered medications on file prior to visit.   Allergies  Allergen Reactions  . Keflex [Cephalexin]     hives   Social History   Social History  . Marital Status: Married    Spouse Name: N/A  . Number of Children: 4  . Years of Education: N/A   Occupational History  . teacher    Social History Main Topics  . Smoking status: Never Smoker   .  Smokeless tobacco: Never Used  . Alcohol Use: 1.2 oz/week    2 Standard drinks or equivalent per week     Comment: occasionally  . Drug Use: No  . Sexual Activity: Yes   Other Topics Concern  . Not on file   Social History Narrative      Review of Systems  All other systems reviewed and are negative.      Objective:   Physical Exam  Constitutional: She appears well-developed and well-nourished. No distress.  HENT:  Right Ear: Tympanic membrane, external ear and ear canal normal.  Left Ear: Tympanic membrane, external ear and ear canal normal.  Nose: Nose normal. No mucosal edema or rhinorrhea.  Mouth/Throat: Posterior oropharyngeal erythema present. No oropharyngeal exudate.  Eyes: Conjunctivae are normal.  Neck: Neck supple.  Cardiovascular: Normal rate, regular rhythm and normal heart sounds.   Pulmonary/Chest: Effort normal and breath sounds normal. No respiratory distress. She has no wheezes. She has no rales.  Lymphadenopathy:    She has no cervical adenopathy.  Skin: She is not diaphoretic.  Vitals reviewed.         Assessment & Plan:  Sore throat - Plan: STREP GROUP A AG, W/REFLEX TO CULT  I suspect a viral upper respiratory infection. Given the erythema in the posterior oropharynx, I will screen the patient for strep throat. I recommended supportive care with Tylenol  and/or ibuprofen as needed for body aches and fevers. The patient can take Sudafed as needed for postnasal drip or congestion. She can take Cepacol lozenges or Chloraseptic as needed for sore throat.

## 2015-08-01 LAB — CULTURE, GROUP A STREP: Organism ID, Bacteria: NORMAL

## 2015-08-02 ENCOUNTER — Telehealth: Payer: Self-pay | Admitting: Gastroenterology

## 2015-08-02 NOTE — Telephone Encounter (Signed)
Patient notified of recommendations. 

## 2015-08-02 NOTE — Telephone Encounter (Signed)
States she has a respiratory virus. She saw her PCP for this last week. She is no longer running fever but is still feeling bad. She is due for Humira and MTX on Tuesday. She is asking if she should hold this week since she is sick. Please, advise.

## 2015-08-02 NOTE — Telephone Encounter (Signed)
I would hold the Humira for one week but should be able to continue the methotrexate.

## 2015-08-06 ENCOUNTER — Ambulatory Visit (INDEPENDENT_AMBULATORY_CARE_PROVIDER_SITE_OTHER): Payer: BC Managed Care – PPO | Admitting: Gastroenterology

## 2015-08-06 DIAGNOSIS — Z23 Encounter for immunization: Secondary | ICD-10-CM

## 2015-08-06 DIAGNOSIS — K5180 Other ulcerative colitis without complications: Secondary | ICD-10-CM

## 2015-08-20 ENCOUNTER — Other Ambulatory Visit: Payer: Self-pay | Admitting: Gastroenterology

## 2015-09-07 ENCOUNTER — Other Ambulatory Visit: Payer: Self-pay | Admitting: Gastroenterology

## 2015-09-16 ENCOUNTER — Other Ambulatory Visit: Payer: Self-pay | Admitting: Gastroenterology

## 2015-09-16 ENCOUNTER — Telehealth: Payer: Self-pay | Admitting: Gastroenterology

## 2015-09-16 MED ORDER — METHOTREXATE 2.5 MG PO TABS: 15.0000 mg | ORAL_TABLET | ORAL | 1 refills | Status: DC

## 2015-09-16 NOTE — Telephone Encounter (Signed)
I advised patient that we have replied to the pharmacy regarding her refill requests. She needs office visit as Dr Adela Lank wanted to see her in June or July. Patient has scheduled office visit for 11/15/15. Methotrexate refill has been sent to the pharmacy until that time. I have also advised that she should no longer be on prescription vitamin D as that was for short term to get her vitamin D levels up more rapidly than OTC vitamin D. She should now be on 1000 iu vitamin D OTC daily. She verbalizes understanding of this as well.

## 2015-10-20 ENCOUNTER — Other Ambulatory Visit: Payer: Self-pay | Admitting: Gastroenterology

## 2015-10-20 DIAGNOSIS — K5100 Ulcerative (chronic) pancolitis without complications: Secondary | ICD-10-CM

## 2015-11-15 ENCOUNTER — Ambulatory Visit (INDEPENDENT_AMBULATORY_CARE_PROVIDER_SITE_OTHER): Payer: BC Managed Care – PPO | Admitting: Gastroenterology

## 2015-11-15 ENCOUNTER — Encounter: Payer: Self-pay | Admitting: Gastroenterology

## 2015-11-15 VITALS — BP 110/84 | HR 80 | Ht 65.5 in | Wt 230.5 lb

## 2015-11-15 DIAGNOSIS — Z79899 Other long term (current) drug therapy: Secondary | ICD-10-CM | POA: Diagnosis not present

## 2015-11-15 DIAGNOSIS — K6389 Other specified diseases of intestine: Secondary | ICD-10-CM | POA: Diagnosis not present

## 2015-11-15 DIAGNOSIS — K529 Noninfective gastroenteritis and colitis, unspecified: Secondary | ICD-10-CM

## 2015-11-15 NOTE — Patient Instructions (Signed)
Your physician has requested that you go to the basement for  lab work before leaving today.   

## 2015-11-15 NOTE — Progress Notes (Signed)
HPI :  35 y/o female with IBD / colitis - unclear Crohns vs. UC- diagnosed in October 2016. Had paradoxical worsening on Lialda, which led to an ED visit. 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 Eventually placed on Humira and had some initial improvement but had some elevated inflammatory markers which led to testing drug levels. In April she had subtherapeutic Humira levels (2.4) with low level AB present (26). Inflammatory markers were positive. She was intermediate metabolizer of TPMT, thus started on methotrexate 15mg  po q week in May 2017 along with folic acid 1mg . Humira was changed to 40mg  weekly at that time.  She reports doing really well since that time. She is having about one BM every 2-3 days or so, sometimes daily or twice daily. She reports stool frequency is at baseline. No blood in the stools. No abdominal pains. She is tolerating the medications well. She is taking them as scheduled. No side effects noted.   IBD Health Care Maintenance: Annual Flu Vaccine - 2017 due Pneumococcal Vaccine if receiving immunosuppression: - PVC13 UTD Pap Smear annually if immunosuppressed - Date: 2017 UTD Skin check / derm eval for those on biologic / thiopuriner, yearly: Date  TB testing if on anti-TNF, yearly - Date November 2016 Vitamin D screening - DUE Last Colonoscopy - 12/04/2014 Pancolitis, with most severe changes in transverse and right colon, with mild changes in the rectum. Normal ileum.   Past Medical History:  Diagnosis Date  . Gastritis   . IBS (irritable bowel syndrome)   . Ulcerative colitis [facility])      Past Surgical History:  Procedure Laterality Date  . TUBAL LIGATION     Family History  Problem Relation Age of Onset  . Colon cancer      cousins and uncles  . Breast cancer Maternal Grandmother   . Diabetes      several maternal relatives  . Hypertension      several paternal and maternal relatives  . Irritable bowel syndrome      mother    Social History  Substance Use Topics  . Smoking status: Never Smoker  . Smokeless tobacco: Never Used  . Alcohol use 1.2 oz/week    2 Standard drinks or equivalent per week     Comment: occasionally   Current Outpatient Prescriptions  Medication Sig Dispense Refill  . Adalimumab (HUMIRA PEN) 40 MG/0.8ML PNKT Inject 40 mg into the skin once a week. 4 each 6  . cetirizine (ZYRTEC) 10 MG tablet Take 10 mg by mouth daily as needed for allergies.    . ferrous sulfate 325 (65 FE) MG tablet TAKE 1 TABLET BY MOUTH TWICE A DAY 60 tablet 0  . fluticasone (FLONASE) 50 MCG/ACT nasal spray Place 1 spray into both nostrils daily as needed for allergies.     . folic acid (FOLVITE) 1 MG tablet TAKE ONE DAILY WHEN STARTS METHOTREXATE 30 tablet 0  . methotrexate (RHEUMATREX) 2.5 MG tablet Take 6 tablets (15 mg total) by mouth once a week. Caution:Chemotherapy. Protect from light. 24 tablet 1   No current facility-administered medications for this visit.    Allergies  Allergen Reactions  . Keflex [Cephalexin]     hives     Review of Systems: All systems reviewed and negative except where noted in HPI.    Lab Results  Component Value Date   WBC 9.5 07/21/2015   HGB 12.7 07/21/2015   HCT 38.7 07/21/2015   MCV 80.6 07/21/2015  PLT 307.0 07/21/2015   Lab Results  Component Value Date   CREATININE 0.98 05/18/2015   BUN 13 05/18/2015   NA 138 05/18/2015   K 4.0 05/18/2015   CL 104 05/18/2015   CO2 27 05/18/2015    Lab Results  Component Value Date   ALT 25 07/21/2015   AST 19 07/21/2015   ALKPHOS 61 07/21/2015   BILITOT 0.4 07/21/2015      Physical Exam: BP 110/84 (BP Location: Left Arm, Patient Position: Sitting, Cuff Size: Normal)   Pulse 80   Ht 5' 5.5" (1.664 m) Comment: height measured without shoes  Wt 230 lb 8 oz (104.6 kg)   LMP 11/10/2015   BMI 37.77 kg/m  Constitutional: Pleasant,well-developed, female in no acute distress. HEENT: Normocephalic and atraumatic.  Conjunctivae are normal. No scleral icterus. Neck supple.  Cardiovascular: Normal rate, regular rhythm.  Pulmonary/chest: Effort normal and breath sounds normal. No wheezing, rales or rhonchi. Abdominal: Soft, nondistended, protuberant, nontender. There are no masses palpable. No hepatomegaly. Extremities: no edema Lymphadenopathy: No cervical adenopathy noted. Neurological: Alert and oriented to person place and time. Skin: Skin is warm and dry. No rashes noted. Psychiatric: Normal mood and affect. Behavior is normal.   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 We have discussed risks / benefits of this regimen and she wishes to continue.  At this time recommend the following: - basic labs with CBC, CMP, ESR, CRP, fecal calprotectin while on new regimen - vitamin D level - will obtain Humira levels trough again now on weekly dosing + MTX, see if ABs remain present and drug level to determine future dosing. Suspect given clinical improvement, this should look better.  - due for flu shot  I will await labs and let her know results / recommendations.   Martha Cellar, MD [facility] Pager [phone]

## 2015-11-16 ENCOUNTER — Other Ambulatory Visit (INDEPENDENT_AMBULATORY_CARE_PROVIDER_SITE_OTHER): Payer: BC Managed Care – PPO

## 2015-11-16 DIAGNOSIS — Z79899 Other long term (current) drug therapy: Secondary | ICD-10-CM | POA: Diagnosis not present

## 2015-11-16 DIAGNOSIS — K6389 Other specified diseases of intestine: Secondary | ICD-10-CM

## 2015-11-16 DIAGNOSIS — K529 Noninfective gastroenteritis and colitis, unspecified: Secondary | ICD-10-CM

## 2015-11-16 LAB — COMPREHENSIVE METABOLIC PANEL
ALBUMIN: 3.7 g/dL (ref 3.5–5.2)
ALK PHOS: 66 U/L (ref 39–117)
ALT: 12 U/L (ref 0–35)
AST: 13 U/L (ref 0–37)
BILIRUBIN TOTAL: 0.4 mg/dL (ref 0.2–1.2)
BUN: 12 mg/dL (ref 6–23)
CALCIUM: 8.6 mg/dL (ref 8.4–10.5)
CO2: 28 mEq/L (ref 19–32)
Chloride: 104 mEq/L (ref 96–112)
Creatinine, Ser: 1 mg/dL (ref 0.40–1.20)
GFR: 81.5 mL/min (ref >60.00)
GLUCOSE: 86 mg/dL (ref 70–99)
POTASSIUM: 4 meq/L (ref 3.5–5.1)
Sodium: 139 mEq/L (ref 135–145)
TOTAL PROTEIN: 7.2 g/dL (ref 6.0–8.3)

## 2015-11-16 LAB — C-REACTIVE PROTEIN: CRP: 0.3 mg/dL — AB (ref 0.5–20.0)

## 2015-11-16 LAB — SEDIMENTATION RATE: SED RATE: 32 mm/h — AB (ref 0–20)

## 2015-11-16 LAB — CBC WITH DIFFERENTIAL/PLATELET
BASOS ABS: 0 10*3/uL (ref 0.0–0.1)
Basophils Relative: 0.3 % (ref 0.0–3.0)
Eosinophils Absolute: 0.1 10*3/uL (ref 0.0–0.7)
Eosinophils Relative: 1.3 % (ref 0.0–5.0)
HCT: 38.8 % (ref 36.0–46.0)
Hemoglobin: 13 g/dL (ref 12.0–15.0)
LYMPHS ABS: 2.5 10*3/uL (ref 0.7–4.0)
Lymphocytes Relative: 25.7 % (ref 12.0–46.0)
MCHC: 33.5 g/dL (ref 30.0–36.0)
MCV: 82.8 fl (ref 78.0–100.0)
MONOS PCT: 6.8 % (ref 3.0–12.0)
Monocytes Absolute: 0.6 10*3/uL (ref 0.1–1.0)
NEUTROS ABS: 6.3 10*3/uL (ref 1.4–7.7)
Neutrophils Relative %: 65.9 % (ref 43.0–77.0)
PLATELETS: 284 10*3/uL (ref 150.0–400.0)
RBC: 4.68 Mil/uL (ref 3.87–5.11)
RDW: 12.9 % (ref 11.5–15.5)
WBC: 9.6 10*3/uL (ref 4.0–10.5)

## 2015-11-16 LAB — VITAMIN D 25 HYDROXY (VIT D DEFICIENCY, FRACTURES): VITD: 22.94 ng/mL — AB (ref 30.00–100.00)

## 2015-11-17 ENCOUNTER — Encounter: Payer: Self-pay | Admitting: Gastroenterology

## 2015-11-17 NOTE — Progress Notes (Signed)
Letter mailed 11/17/15. jf 

## 2015-11-18 ENCOUNTER — Other Ambulatory Visit: Payer: Self-pay | Admitting: Gastroenterology

## 2015-11-21 ENCOUNTER — Other Ambulatory Visit: Payer: Self-pay | Admitting: Gastroenterology

## 2015-11-21 DIAGNOSIS — K5100 Ulcerative (chronic) pancolitis without complications: Secondary | ICD-10-CM

## 2015-11-22 LAB — SERIAL MONITORING

## 2015-11-22 LAB — ADALIMUMAB+AB (SERIAL MONITOR)
Adalimumab Drug Level: 5.7 ug/mL
Anti-Adalimumab Antibody: <25 ng/mL

## 2015-11-23 ENCOUNTER — Other Ambulatory Visit: Payer: Self-pay

## 2015-11-25 ENCOUNTER — Other Ambulatory Visit: Payer: Self-pay

## 2015-11-26 ENCOUNTER — Other Ambulatory Visit: Payer: BC Managed Care – PPO

## 2015-11-26 ENCOUNTER — Other Ambulatory Visit: Payer: Self-pay

## 2015-11-26 DIAGNOSIS — Z79899 Other long term (current) drug therapy: Secondary | ICD-10-CM

## 2015-11-26 DIAGNOSIS — K529 Noninfective gastroenteritis and colitis, unspecified: Secondary | ICD-10-CM

## 2015-11-26 DIAGNOSIS — K51919 Ulcerative colitis, unspecified with unspecified complications: Secondary | ICD-10-CM

## 2015-11-29 ENCOUNTER — Telehealth: Payer: Self-pay | Admitting: Gastroenterology

## 2015-11-29 NOTE — Telephone Encounter (Signed)
Her vitamin D is improved from previous but remains low (< 30). I had sent her a letter to take vitamin D 1000 to 2000 international units / day indefinitely to maintain her vitamin D levels. We will get results of stool test to her once available. Thanks

## 2015-11-29 NOTE — Telephone Encounter (Signed)
Patient was wondering about her lab results. Patient has not yet received her letter re: lab results. Read her the results in the letter. She was questioning her Vit. D level and if she should stay off of the Vit. D. I told her that I would check with Dr. Adela LankArmbruster.  I also explained that her fecal Calprotectin results are not ready yet, and once they are I will be contacting her.

## 2015-11-30 NOTE — Telephone Encounter (Signed)
I tried calling the patient and the vm was not accepting messages. I re-sent letter with the specifics of the Vit. D level.

## 2015-12-02 LAB — CALPROTECTIN: Calprotectin: 131.2 mcg/g (ref <=162.9)

## 2015-12-14 ENCOUNTER — Encounter: Payer: Self-pay | Admitting: Gastroenterology

## 2015-12-16 ENCOUNTER — Telehealth: Payer: Self-pay

## 2015-12-16 NOTE — Telephone Encounter (Signed)
Yes we can refill it but she needs labs again in another month or so while on MTX (CBC, LFTs) Thanks

## 2015-12-16 NOTE — Telephone Encounter (Signed)
[facility]ts MTX. Request is for a 90 day supply. Is this ok to refill?

## 2015-12-17 ENCOUNTER — Other Ambulatory Visit: Payer: Self-pay

## 2015-12-17 DIAGNOSIS — K51919 Ulcerative colitis, unspecified with unspecified complications: Secondary | ICD-10-CM

## 2015-12-17 MED ORDER — METHOTREXATE 2.5 MG PO TABS: 15.0000 mg | ORAL_TABLET | ORAL | 1 refills | Status: DC

## 2015-12-17 NOTE — Telephone Encounter (Signed)
Pt informed. MTX refilled and labs order. Pt states that she will come for labs in 1 month.

## 2015-12-17 NOTE — Progress Notes (Signed)
Letter mailed 10/27.jf 

## 2015-12-30 ENCOUNTER — Other Ambulatory Visit: Payer: Self-pay | Admitting: Gastroenterology

## 2016-01-17 ENCOUNTER — Other Ambulatory Visit: Payer: Self-pay | Admitting: Gastroenterology

## 2016-01-21 ENCOUNTER — Other Ambulatory Visit: Payer: Self-pay | Admitting: Gastroenterology

## 2016-01-21 DIAGNOSIS — K5100 Ulcerative (chronic) pancolitis without complications: Secondary | ICD-10-CM

## 2016-02-02 ENCOUNTER — Other Ambulatory Visit: Payer: Self-pay

## 2016-02-02 ENCOUNTER — Other Ambulatory Visit: Payer: Self-pay | Admitting: Gastroenterology

## 2016-02-02 NOTE — Progress Notes (Signed)
Letter mailed re: follow up lab work needed

## 2016-02-03 ENCOUNTER — Telehealth: Payer: Self-pay | Admitting: Gastroenterology

## 2016-02-04 ENCOUNTER — Other Ambulatory Visit (INDEPENDENT_AMBULATORY_CARE_PROVIDER_SITE_OTHER): Payer: BC Managed Care – PPO

## 2016-02-04 ENCOUNTER — Other Ambulatory Visit: Payer: Self-pay

## 2016-02-04 DIAGNOSIS — K51919 Ulcerative colitis, unspecified with unspecified complications: Secondary | ICD-10-CM | POA: Diagnosis not present

## 2016-02-04 LAB — COMPREHENSIVE METABOLIC PANEL
ALK PHOS: 61 U/L (ref 39–117)
ALT: 15 U/L (ref 0–35)
AST: 13 U/L (ref 0–37)
Albumin: 4 g/dL (ref 3.5–5.2)
BUN: 12 mg/dL (ref 6–23)
CHLORIDE: 103 meq/L (ref 96–112)
CO2: 27 mEq/L (ref 19–32)
Calcium: 8.8 mg/dL (ref 8.4–10.5)
Creatinine, Ser: 0.95 mg/dL (ref 0.40–1.20)
GFR: 86.36 mL/min (ref >60.00)
GLUCOSE: 92 mg/dL (ref 70–99)
POTASSIUM: 3.9 meq/L (ref 3.5–5.1)
Sodium: 138 mEq/L (ref 135–145)
Total Bilirubin: 0.4 mg/dL (ref 0.2–1.2)
Total Protein: 7 g/dL (ref 6.0–8.3)

## 2016-02-04 LAB — CBC WITH DIFFERENTIAL/PLATELET
BASOS ABS: 0 10*3/uL (ref 0.0–0.1)
BASOS PCT: 0.2 % (ref 0.0–3.0)
EOS ABS: 0.1 10*3/uL (ref 0.0–0.7)
Eosinophils Relative: 0.8 % (ref 0.0–5.0)
HCT: 38.6 % (ref 36.0–46.0)
HEMOGLOBIN: 12.8 g/dL (ref 12.0–15.0)
LYMPHS PCT: 25.8 % (ref 12.0–46.0)
Lymphs Abs: 2.5 10*3/uL (ref 0.7–4.0)
MCHC: 33.3 g/dL (ref 30.0–36.0)
MCV: 82.8 fl (ref 78.0–100.0)
MONOS PCT: 6.2 % (ref 3.0–12.0)
Monocytes Absolute: 0.6 10*3/uL (ref 0.1–1.0)
NEUTROS ABS: 6.5 10*3/uL (ref 1.4–7.7)
Neutrophils Relative %: 67 % (ref 43.0–77.0)
PLATELETS: 273 10*3/uL (ref 150.0–400.0)
RBC: 4.66 Mil/uL (ref 3.87–5.11)
RDW: 13 % (ref 11.5–15.5)
WBC: 9.7 10*3/uL (ref 4.0–10.5)

## 2016-02-04 LAB — HEPATIC FUNCTION PANEL
ALBUMIN: 4 g/dL (ref 3.5–5.2)
ALK PHOS: 61 U/L (ref 39–117)
ALT: 15 U/L (ref 0–35)
AST: 13 U/L (ref 0–37)
BILIRUBIN DIRECT: 0.1 mg/dL (ref 0.0–0.3)
TOTAL PROTEIN: 7 g/dL (ref 6.0–8.3)
Total Bilirubin: 0.4 mg/dL (ref 0.2–1.2)

## 2016-02-04 LAB — C-REACTIVE PROTEIN: CRP: 0.4 mg/dL — ABNORMAL LOW (ref 0.5–20.0)

## 2016-02-04 LAB — SEDIMENTATION RATE: SED RATE: 28 mm/h — AB (ref 0–20)

## 2016-02-04 NOTE — Telephone Encounter (Signed)
Pt informed that Humira was sent to her pharmacy yesterday. She did have blood work this morning. Instructed pt that she could come Monday morning for her Flu shot. Pt states that she will be here at 8am.

## 2016-02-07 ENCOUNTER — Ambulatory Visit: Payer: BC Managed Care – PPO

## 2016-02-08 ENCOUNTER — Other Ambulatory Visit: Payer: Self-pay

## 2016-02-10 ENCOUNTER — Ambulatory Visit (INDEPENDENT_AMBULATORY_CARE_PROVIDER_SITE_OTHER): Payer: BC Managed Care – PPO | Admitting: Gastroenterology

## 2016-02-10 DIAGNOSIS — Z23 Encounter for immunization: Secondary | ICD-10-CM | POA: Diagnosis not present

## 2016-02-10 DIAGNOSIS — K51919 Ulcerative colitis, unspecified with unspecified complications: Secondary | ICD-10-CM

## 2016-02-10 DIAGNOSIS — Z79899 Other long term (current) drug therapy: Secondary | ICD-10-CM

## 2016-02-11 ENCOUNTER — Ambulatory Visit: Payer: BC Managed Care – PPO

## 2016-03-01 ENCOUNTER — Ambulatory Visit (INDEPENDENT_AMBULATORY_CARE_PROVIDER_SITE_OTHER): Payer: BC Managed Care – PPO | Admitting: Family Medicine

## 2016-03-01 ENCOUNTER — Encounter: Payer: Self-pay | Admitting: Family Medicine

## 2016-03-01 VITALS — BP 108/72 | HR 76 | Temp 98.7°F | Resp 14 | Ht 66.0 in | Wt 232.0 lb

## 2016-03-01 DIAGNOSIS — R319 Hematuria, unspecified: Secondary | ICD-10-CM | POA: Diagnosis not present

## 2016-03-01 DIAGNOSIS — N390 Urinary tract infection, site not specified: Secondary | ICD-10-CM | POA: Diagnosis not present

## 2016-03-01 LAB — URINALYSIS, ROUTINE W REFLEX MICROSCOPIC
Bilirubin Urine: NEGATIVE
Glucose, UA: NEGATIVE
KETONES UR: NEGATIVE
Nitrite: NEGATIVE
SPECIFIC GRAVITY, URINE: 1.02 (ref 1.001–1.035)
pH: 6 (ref 5.0–8.0)

## 2016-03-01 LAB — URINALYSIS, MICROSCOPIC ONLY
CASTS: NONE SEEN [LPF]
CRYSTALS: NONE SEEN [HPF]
YEAST: NONE SEEN [HPF]

## 2016-03-01 MED ORDER — SULFAMETHOXAZOLE-TRIMETHOPRIM 800-160 MG PO TABS: 1.0000 | ORAL_TABLET | Freq: Two times a day (BID) | ORAL | 0 refills | Status: DC

## 2016-03-01 NOTE — Progress Notes (Signed)
   Subjective:    Patient ID: Theresa Farmer, female    DOB: 1980-10-18, 36 y.o.   MRN: [id number]  Patient presents for Dysuria (x1 week- flank pain, frequent urination, foul odor to urine, faatigue)  Patient with left flank pain and urinary frequency odor or cloudiness to her urine that is worsening over the past week. She just also noted some fatigue. She has ulcer colitis and is currently on immunosuppressants as well as methotrexate. She's not had any fever. She denies any gross blood in the urine. No significant abdominal pain   Review Of Systems:  GEN-+ fatigue,  Denies fever, weight loss,weakness, recent illness HEENT- denies eye drainage, change in vision, nasal discharge, CVS- denies chest pain, palpitations RESP- denies SOB, cough, wheeze ABD- denies N/V, change in stools, abd pain GU- + dysuria,denies  hematuria, dribbling, incontinence MSK- denies joint pain, muscle aches, injury Neuro- denies headache, dizziness, syncope, seizure activity       Objective:    BP 108/72 (BP Location: Left Arm, Patient Position: Sitting, Cuff Size: Large)   Pulse 76   Temp 98.7 F (37.1 C) (Oral)   Resp 14   Ht 5\' 6"  (1.676 m)   Wt 232 lb (105.2 kg)   LMP 02/19/2016 Comment: regular  SpO2 98%   BMI 37.45 kg/m  GEN- NAD, alert and oriented x3 CVS- RRR, no murmur RESP-CTAB ABD-NABS,soft,NT,ND, no CVA tenderness  Pulses- Radial  2+        Assessment & Plan:      Problem List Items Addressed This Visit    None    Visit Diagnoses    Urinary tract infection with hematuria, site unspecified    -  Primary   Bactrim twice a day , increase water , hold Humira, MTX as directed by rheumatology while on antibioics, culture sent   Relevant Medications   sulfamethoxazole-trimethoprim (BACTRIM DS,SEPTRA DS) 800-160 MG tablet   Other Relevant Orders   Urinalysis, Routine w reflex microscopic (Completed)   Urine culture      Note: This dictation was prepared with Dragon dictation  along with smaller phrase technology. Any transcriptional errors that result from this process are unintentional.

## 2016-03-01 NOTE — Patient Instructions (Signed)
F/U as needed

## 2016-03-02 ENCOUNTER — Encounter: Payer: Self-pay | Admitting: Family Medicine

## 2016-03-03 LAB — URINE CULTURE

## 2016-03-07 ENCOUNTER — Other Ambulatory Visit: Payer: Self-pay

## 2016-03-15 ENCOUNTER — Ambulatory Visit (AMBULATORY_SURGERY_CENTER): Payer: Self-pay

## 2016-03-15 VITALS — Ht 66.0 in | Wt 232.0 lb

## 2016-03-15 DIAGNOSIS — K5010 Crohn's disease of large intestine without complications: Secondary | ICD-10-CM

## 2016-03-15 MED ORDER — SUPREP BOWEL PREP KIT 17.5-3.13-1.6 GM/177ML PO SOLN: 1.0000 | Freq: Once | ORAL | 0 refills | Status: AC

## 2016-03-15 NOTE — Progress Notes (Signed)
No allergies to eggs or soy No diet meds No past problems with anesthesia  No home oxygen  Declined emmi 

## 2016-03-24 ENCOUNTER — Encounter: Payer: Self-pay | Admitting: Gastroenterology

## 2016-03-27 ENCOUNTER — Encounter: Payer: BC Managed Care – PPO | Admitting: Gastroenterology

## 2016-04-07 ENCOUNTER — Ambulatory Visit (AMBULATORY_SURGERY_CENTER): Payer: BC Managed Care – PPO | Admitting: Gastroenterology

## 2016-04-07 ENCOUNTER — Encounter: Payer: Self-pay | Admitting: Gastroenterology

## 2016-04-07 VITALS — BP 124/74 | HR 67 | Temp 98.4°F | Resp 14 | Ht 66.0 in | Wt 232.0 lb

## 2016-04-07 DIAGNOSIS — K5010 Crohn's disease of large intestine without complications: Secondary | ICD-10-CM | POA: Diagnosis not present

## 2016-04-07 MED ORDER — SODIUM CHLORIDE 0.9 % IV SOLN: 500.0000 mL | INTRAVENOUS | Status: DC

## 2016-04-07 MED ORDER — METHOTREXATE 2.5 MG PO TABS: 15.0000 mg | ORAL_TABLET | ORAL | 3 refills | Status: DC

## 2016-04-07 MED ORDER — METHOTREXATE 2.5 MG PO TABS: 2.5000 mg | ORAL_TABLET | ORAL | 0 refills | Status: DC

## 2016-04-07 NOTE — Progress Notes (Signed)
A/ox3 pleased with MAC, report to Jane RN 

## 2016-04-07 NOTE — Patient Instructions (Addendum)
Impression/Recommendations:  YOU HAD AN ENDOSCOPIC PROCEDURE TODAY AT THE [facility]:   Refer to the procedure report that was given to you for any specific questions about what was found during the examination.  If the procedure report does not answer your questions, please call your gastroenterologist to clarify.  If you requested that your care partner not be given the details of your procedure findings, then the procedure report has been included in a sealed envelope for you to review at your convenience later.  YOU SHOULD EXPECT: Some feelings of bloating in the abdomen. Passage of more gas than usual.  Walking can help get rid of the air that was put into your GI tract during the procedure and reduce the bloating. If you had a lower endoscopy (such as a colonoscopy or flexible sigmoidoscopy) you may notice spotting of blood in your stool or on the toilet paper. If you underwent a bowel prep for your procedure, you may not have a normal bowel movement for a few days.  Please Note:  You might notice some irritation and congestion in your nose or some drainage.  This is from the oxygen used during your procedure.  There is no need for concern and it should clear up in a day or so.  SYMPTOMS TO REPORT IMMEDIATELY:   Following lower endoscopy (colonoscopy or flexible sigmoidoscopy):  Excessive amounts of blood in the stool  Significant tenderness or worsening of abdominal pains  Swelling of the abdomen that is new, acute  Fever of 100F or higher   For urgent or emergent issues, a gastroenterologist can be reached at any hour by calling [phone].   DIET:  We do recommend a small meal at first, but then you may proceed to your regular diet.  Drink plenty of fluids but you should avoid alcoholic beverages for 24 hours.  ACTIVITY:  You should plan to take it easy for the rest of today and you should NOT DRIVE or use heavy machinery until tomorrow (because of the sedation  medicines used during the test).    FOLLOW UP: Our staff will call the number listed on your records the next business day following your procedure to check on you and address any questions or concerns that you may have regarding the information given to you following your procedure. If we do not reach you, we will leave a message.  However, if you are feeling well and you are not experiencing any problems, there is no need to return our call.  We will assume that you have returned to your regular daily activities without incident.  If any biopsies were taken you will be contacted by phone or by letter within the next 1-3 weeks.  Please call us at [phone] if you have not heard about the biopsies in 3 weeks.    SIGNATURES/CONFIDENTIALITY: You and/or your care partner have signed paperwork which will be entered into your electronic medical record.  These signatures attest to the fact that that the information above on your After Visit Summary has been reviewed and is understood.  Full responsibility of the confidentiality of this discharge information lies with you and/or your care-partner. 

## 2016-04-07 NOTE — Progress Notes (Signed)
Pt's states no medical or surgical changes since previsit or office visit. 

## 2016-04-07 NOTE — Op Note (Signed)
[facility] Patient Name: Theresa Farmer Procedure Date: 04/07/2016 1:51 PM MRN: [id number] Endoscopist: Viviann Spare P. Akaisha Truman MD, MD Age: 36 Referring MD:  Date of Birth: 1981-02-06 Gender: Female Account #: [id number] Procedure:                Colonoscopy Indications:              Inflammatory bowel disease (indeterminate colitis),                            on Humira and methotrexate with resolution of                            symptoms, here for restaging colonoscopy Medicines:                Monitored Anesthesia Care Procedure:                Pre-Anesthesia Assessment:                           - Prior to the procedure, a History and Physical                            was performed, and patient medications and                            allergies were reviewed. The patient's tolerance of                            previous anesthesia was also reviewed. The risks                            and benefits of the procedure and the sedation                            options and risks were discussed with the patient.                            All questions were answered, and informed consent                            was obtained. Prior Anticoagulants: The patient has                            taken no previous anticoagulant or antiplatelet                            agents. ASA Grade Assessment: II - A patient with                            mild systemic disease. After reviewing the risks                            and benefits, the patient was deemed in  satisfactory condition to undergo the procedure.                           After obtaining informed consent, the colonoscope                            was passed under direct vision. Throughout the                            procedure, the patient's blood pressure, pulse, and                            oxygen saturations were monitored continuously. The                            Model  CF-HQ190L [id number](SN#[id number]) scope was introduced                            through the anus and advanced to the the terminal                            ileum, with identification of the appendiceal                            orifice and IC valve. The colonoscopy was performed                            without difficulty. The patient tolerated the                            procedure well. The quality of the bowel                            preparation was good. The terminal ileum, ileocecal                            valve, appendiceal orifice, and rectum were                            photographed. Scope In: 2:02:22 PM Scope Out: 2:17:54 PM Scope Withdrawal Time: 0 hours 12 minutes 26 seconds  Total Procedure Duration: 0 hours 15 minutes 32 seconds  Findings:                 The perianal and digital rectal examinations were                            normal.                           A patchy area of mucosa in the terminal ileum was                            mildly erythematous. This was biopsied with a cold  forceps for histology.                           There was a very small cecal patch of mild                            erythema, but otherwise exam was otherwise without                            abnormality on direct and retroflexion views. No                            active inflammation and healing of the colon on                            present regimen.                           Biopsies for histology were taken with a cold                            forceps from the right colon, left colon and                            transverse colon for evaluation of colitis. Complications:            No immediate complications. Estimated blood loss:                            Minimal. Estimated Blood Loss:     Estimated blood loss was minimal. Impression:               - Erythematous mucosa in the terminal ileum.                            Biopsied.                            - The examination was otherwise normal on direct                            and retroflexion views.                           - Biopsies were taken with a cold forceps from the                            right colon, left colon and transverse colon for                            evaluation of colitis.                           Overall, colonic healing on present regimen. ? mild  inflammation in the ileum Recommendation:           - Patient has a contact number available for                            emergencies. The signs and symptoms of potential                            delayed complications were discussed with the                            patient. Return to normal activities tomorrow.                            Written discharge instructions were provided to the                            patient.                           - Resume previous diet.                           - Continue present medications.                           - Await pathology results. Viviann Spare P. Kenner Lewan MD, MD 04/07/2016 2:23:06 PM This report has been signed electronically.

## 2016-04-07 NOTE — Progress Notes (Signed)
Called to room to assist during endoscopic procedure.  Patient ID and intended procedure confirmed with present staff. Received instructions for my participation in the procedure from the performing physician.  

## 2016-04-10 ENCOUNTER — Telehealth: Payer: Self-pay | Admitting: *Deleted

## 2016-04-10 NOTE — Telephone Encounter (Signed)
  Follow up Call-  Call back number 04/07/2016 12/04/2014  Post procedure Call Back phone  # [phone]  Permission to leave phone message Yes No  Some recent data might be hidden     Patient questions:  Do you have a fever, pain , or abdominal swelling? No. Pain Score  0 *  Have you tolerated food without any problems? Yes.    Have you been able to return to your normal activities? Yes.    Do you have any questions about your discharge instructions: Diet   No. Medications  No. Follow up visit  No.  Do you have questions or concerns about your Care? No.  Actions: * If pain score is 4 or above: No action needed, pain <4.  Requested work note for Friday. Completed.

## 2016-04-12 ENCOUNTER — Encounter: Payer: Self-pay | Admitting: Gastroenterology

## 2016-05-01 ENCOUNTER — Encounter: Payer: Self-pay | Admitting: Family Medicine

## 2016-05-01 ENCOUNTER — Ambulatory Visit (INDEPENDENT_AMBULATORY_CARE_PROVIDER_SITE_OTHER): Payer: BC Managed Care – PPO | Admitting: Family Medicine

## 2016-05-01 VITALS — BP 124/70 | HR 78 | Temp 98.3°F | Resp 16 | Ht 66.0 in | Wt 231.0 lb

## 2016-05-01 DIAGNOSIS — R0981 Nasal congestion: Secondary | ICD-10-CM

## 2016-05-01 MED ORDER — FLUTICASONE PROPIONATE 50 MCG/ACT NA SUSP
1.0000 | Freq: Every day | NASAL | 2 refills | Status: DC | PRN
Start: 2016-05-01 — End: 2016-06-08

## 2016-05-01 MED ORDER — PSEUDOEPHEDRINE HCL ER 120 MG PO TB12: 120.0000 mg | ORAL_TABLET | Freq: Two times a day (BID) | ORAL | Status: DC

## 2016-05-01 NOTE — Progress Notes (Signed)
   Subjective:    Patient ID: Theresa Farmer, female    DOB: 10-Dec-1980, 36 y.o.   MRN: [id number]  Patient presents for Ear Issues (B ear pressure- decrease in hearing- has been having sinus pressure)   Pt here with bilateral ear Pressure. She is not sure if there something clogged in her ear. 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 She was started on Tamiflu yesterday. Today states that she feels a little better. She's not had any sinus pressure or drainage   Review Of Systems:  GEN- denies fatigue, fever, weight loss,weakness, recent illness HEENT- denies eye drainage, change in vision, nasal discharge, CVS- denies chest pain, palpitations RESP- denies SOB, cough, wheeze ABD- denies N/V, change in stools, abd pain GU- denies dysuria, hematuria, dribbling, incontinence MSK- joint pain, muscle aches, injury Neuro- denies headache, dizziness, syncope, seizure activity       Objective:    BP 124/70   Pulse 78   Temp 98.3 F (36.8 C) (Oral)   Resp 16   Ht 5\' 6"  (1.676 m)   Wt 231 lb (104.8 kg)   LMP 04/18/2016 (Approximate) Comment: regular  SpO2 99%   BMI 37.28 kg/m  GEN- NAD, alert and oriented x3 HEENT- PERRL, EOMI, non injected sclera, pink conjunctiva, MMM, oropharynx clear, TM clear bilat, no effusion, nares clear rhinorrhea  Neck- Supple, no LAD  CVS- RRR, no murmur RESP-CTAB Pulses- Radial  2+        Assessment & Plan:      Problem List Items Addressed This Visit    None    Visit Diagnoses    Nasal congestion    -  Primary   More congestion with viral illness, can use sudafed or anti-histamine, flonase also filled Complete the tamiflu Hold Humira this week and MTX      Note: This dictation was prepared with Dragon dictation along with smaller phrase technology. Any transcriptional errors that  result from this process are unintentional.

## 2016-05-01 NOTE — Patient Instructions (Addendum)
Take Sudafed Use flonase F/U as needed

## 2016-05-02 ENCOUNTER — Other Ambulatory Visit: Payer: Self-pay | Admitting: Gastroenterology

## 2016-05-02 ENCOUNTER — Other Ambulatory Visit: Payer: Self-pay

## 2016-05-02 MED ORDER — METHOTREXATE 2.5 MG PO TABS: ORAL_TABLET | ORAL | 0 refills | Status: DC

## 2016-05-02 NOTE — Telephone Encounter (Signed)
Noralee StainHi Tony, We can refill it, but the correct dose is 2.5mg  tablet - 6 tabs po q week (total 15mg  / week). Can you please adjust the prescription accordingly? Thanks

## 2016-05-02 NOTE — Telephone Encounter (Signed)
Medication dose has been correted and refilled as directed by Dr Adela LankArmbruster.

## 2016-05-03 ENCOUNTER — Other Ambulatory Visit: Payer: Self-pay | Admitting: Gastroenterology

## 2016-05-03 NOTE — Telephone Encounter (Signed)
Morrie Sheldonshley I already responded to a request from Sheralyn Boatmanoni about this, can you double check with her, not sure if she already put it in/  The dose is 6 tablets once per week (15mg  / week). Please give her a 3 month supply at a time with one refill.   Thanks

## 2016-05-03 NOTE — Telephone Encounter (Signed)
Ok to refill MTX

## 2016-05-05 ENCOUNTER — Other Ambulatory Visit: Payer: Self-pay | Admitting: Gastroenterology

## 2016-05-08 ENCOUNTER — Other Ambulatory Visit: Payer: Self-pay

## 2016-05-08 NOTE — Telephone Encounter (Signed)
Dr. Adela LankArmbruster, MTX was sent correctly on 05/02/16 stating 6 tablets per week with a total of 15mg  per wk. I'm not sure why another request came through. Sorry for sending it again.

## 2016-05-08 NOTE — Telephone Encounter (Signed)
It's okay thanks for clarifying

## 2016-05-08 NOTE — Telephone Encounter (Signed)
Theresa Farmer can you please confirm you got my last message, this prescription still has wrong dose and total amount

## 2016-05-23 ENCOUNTER — Other Ambulatory Visit: Payer: Self-pay | Admitting: Gastroenterology

## 2016-05-23 NOTE — Telephone Encounter (Signed)
Ok to refill MTX

## 2016-05-23 NOTE — Telephone Encounter (Signed)
Morrie Sheldon I think this is the third request I have had for this medication for this patient. Not sure why I keep getting it. Did the patient receive it okay? The script is for 6 tabs once per week (total  once weekly). Please give her a 3 month supply with a refill.

## 2016-05-24 ENCOUNTER — Other Ambulatory Visit: Payer: Self-pay

## 2016-05-24 MED ORDER — METHOTREXATE 2.5 MG PO TABS: 15.0000 mg | ORAL_TABLET | ORAL | 1 refills | Status: DC

## 2016-05-24 NOTE — Telephone Encounter (Signed)
I changed the Rx to  weekly. Whoever sent last time it was not for enough medication. I gave her enough to take 6 tabs weekly for 3 months with one refill.

## 2016-05-24 NOTE — Telephone Encounter (Signed)
Thanks for your help.

## 2016-06-08 ENCOUNTER — Other Ambulatory Visit: Payer: Self-pay | Admitting: *Deleted

## 2016-06-08 MED ORDER — FLUTICASONE PROPIONATE 50 MCG/ACT NA SUSP: 1.0000 | Freq: Every day | NASAL | 2 refills | Status: DC | PRN

## 2016-06-15 ENCOUNTER — Ambulatory Visit (INDEPENDENT_AMBULATORY_CARE_PROVIDER_SITE_OTHER): Payer: BC Managed Care – PPO | Admitting: Physician Assistant

## 2016-06-15 ENCOUNTER — Encounter: Payer: Self-pay | Admitting: Physician Assistant

## 2016-06-15 VITALS — BP 124/80 | HR 73 | Temp 97.9°F | Resp 18 | Wt 232.0 lb

## 2016-06-15 DIAGNOSIS — J988 Other specified respiratory disorders: Secondary | ICD-10-CM | POA: Diagnosis not present

## 2016-06-15 DIAGNOSIS — R635 Abnormal weight gain: Secondary | ICD-10-CM | POA: Diagnosis not present

## 2016-06-15 DIAGNOSIS — B9689 Other specified bacterial agents as the cause of diseases classified elsewhere: Secondary | ICD-10-CM

## 2016-06-15 MED ORDER — AZITHROMYCIN 250 MG PO TABS: ORAL_TABLET | ORAL | 0 refills | Status: DC

## 2016-06-15 NOTE — Progress Notes (Signed)
Patient ID: Theresa Farmer MRN: [id number], DOB: [birth year], 36 y.o. Date of Encounter: 06/15/2016, 3:08 PM    Chief Complaint:  Chief Complaint  Patient presents with  . Shortness of Breath    x1wk  . Nasal Congestion  . Cough     HPI: 36 y.o. year old female presents with above.   States that she has been having nasal congestion as well as chest congestion and cough for a week now. Has had no sore throat. No fevers or chills.  Also knows that she needs to lose weight but has been trying to improve her diet and increase exercise and has not been successful in losing weight.     Home Meds:   Outpatient Medications Prior to Visit  Medication Sig Dispense Refill  . cetirizine (ZYRTEC) 10 MG tablet Take 10 mg by mouth daily as needed for allergies.    . fluticasone (FLONASE) 50 MCG/ACT nasal spray Place 1 spray into both nostrils daily as needed for allergies. 48 g 2  . folic acid (FOLVITE) 1 MG tablet TAKE ONE DAILY WHEN STARTS METHOTREXATE 30 tablet 3  . HUMIRA PEN 40 MG/0.8ML PNKT INJECT ONE PEN (40 MG) SUBCUTANEOUSLY EVERY WEEK. REFRIGERATE. 1 each 5  . methotrexate (RHEUMATREX) 2.5 MG tablet Please take 15 mg once a week (a total of 6 tablets)  Caution:Chemotherapy. Protect from light. 6 tablet 0  . methotrexate (RHEUMATREX) 2.5 MG tablet Take 6 tablets (15 mg total) by mouth once a week. Caution:Chemotherapy. Protect from light. Take 6 tablets weekly for a total of  once weekly 72 tablet 1  . oseltamivir (TAMIFLU) 75 MG capsule Take 75 mg by mouth 2 (two) times daily.     No facility-administered medications prior to visit.     Allergies:  Allergies  Allergen Reactions  . Keflex [Cephalexin]     hives      Review of Systems: See HPI for pertinent ROS. All other ROS negative.    Physical Exam: Blood pressure 124/80, pulse 73, temperature 97.9 F (36.6 C), temperature source Oral, resp. rate 18, weight 232 lb (105.2 kg), last menstrual period 05/22/2016,  SpO2 98 %., Body mass index is 37.45 kg/m. General:  Obese AAF. Appears in no acute distress. HEENT: Normocephalic, atraumatic, eyes without discharge, sclera non-icteric, nares are without discharge. Bilateral auditory canals clear, TM's are without perforation, pearly grey and translucent with reflective cone of light bilaterally. Oral cavity moist, posterior pharynx without exudate, erythema, peritonsillar abscess.  Neck: Supple. No thyromegaly. No lymphadenopathy. Lungs: Clear bilaterally to auscultation without wheezes, rales, or rhonchi. Breathing is unlabored. Heart: Regular rhythm. No murmurs, rubs, or gallops. Msk:  Strength and tone normal for age. Extremities/Skin: Warm and dry.  Neuro: Alert and oriented X 3. Moves all extremities spontaneously. Gait is normal. CNII-XII grossly in tact. Psych:  Responds to questions appropriately with a normal affect.     ASSESSMENT AND PLAN:  36 y.o. year old female with  1. Bacterial respiratory infection She is to take antibiotic as directed. Follow-up if symptoms do not resolve within 1 week after completion of antibiotic. - azithromycin (ZITHROMAX) 250 MG tablet; Day 1: Take 2 daily. Days 2 -5: Take 1 daily.  Dispense: 6 tablet; Refill: 0  2. Weight gain Will check TSH to see if hypothyroidism contributing to weight gain. If this is normal then she will need to continue diet exercise to control weight. - TSH   Signed, [address] [place], [place], [facility] 06/15/2016 3:08 PM

## 2016-06-16 ENCOUNTER — Encounter: Payer: Self-pay | Admitting: Family Medicine

## 2016-06-16 LAB — TSH: TSH: 1.42 m[IU]/L

## 2016-06-22 ENCOUNTER — Other Ambulatory Visit: Payer: Self-pay | Admitting: *Deleted

## 2016-06-22 MED ORDER — FLUTICASONE PROPIONATE 50 MCG/ACT NA SUSP: 1.0000 | Freq: Every day | NASAL | 2 refills | Status: DC | PRN

## 2016-08-21 ENCOUNTER — Ambulatory Visit (INDEPENDENT_AMBULATORY_CARE_PROVIDER_SITE_OTHER): Payer: BC Managed Care – PPO | Admitting: Physician Assistant

## 2016-08-21 ENCOUNTER — Encounter: Payer: Self-pay | Admitting: Physician Assistant

## 2016-08-21 VITALS — BP 130/78 | HR 60 | Temp 98.0°F | Resp 16 | Ht 66.0 in | Wt 233.2 lb

## 2016-08-21 DIAGNOSIS — S63693A Other sprain of left middle finger, initial encounter: Secondary | ICD-10-CM | POA: Diagnosis not present

## 2016-08-21 NOTE — Progress Notes (Signed)
Patient ID: Theresa Farmer MRN: [id number], DOB: [birth year], 36 y.o. Date of Encounter: 08/21/2016, 11:34 AM    Chief Complaint:  Chief Complaint  Patient presents with  . jammed left finger swollen     HPI: 36 y.o. year old female presents with above.   States that this injury happened yesterday while at the [employer] --she hit her finger on somebody's leg and jammed her left fourth finger at the PIP joint. Says that she has had lots of jammed fingers from playing basketball when she was younger but just thought she should get this checked. Says  "it does not feel like it's broken or anything" and knows that she just jammed it against someone's leg. No other concerns to address.     Home Meds:   Outpatient Medications Prior to Visit  Medication Sig Dispense Refill  . cetirizine (ZYRTEC) 10 MG tablet Take 10 mg by mouth daily as needed for allergies.    . fluticasone (FLONASE) 50 MCG/ACT nasal spray Place 1 spray into both nostrils daily as needed for allergies. 48 g 2  . folic acid (FOLVITE) 1 MG tablet TAKE ONE DAILY WHEN STARTS METHOTREXATE 30 tablet 3  . HUMIRA PEN 40 MG/0.8ML PNKT INJECT ONE PEN (40 MG) SUBCUTANEOUSLY EVERY WEEK. REFRIGERATE. 1 each 5  . methotrexate (RHEUMATREX) 2.5 MG tablet Take 6 tablets (15 mg total) by mouth once a week. Caution:Chemotherapy. Protect from light. Take 6 tablets weekly for a total of 15mg  once weekly 72 tablet 1  . azithromycin (ZITHROMAX) 250 MG tablet Day 1: Take 2 daily. Days 2 -5: Take 1 daily. 6 tablet 0  . methotrexate (RHEUMATREX) 2.5 MG tablet Please take 15 mg once a week (a total of 6 tablets)  Caution:Chemotherapy. Protect from light. 6 tablet 0   No facility-administered medications prior to visit.     Allergies:  Allergies  Allergen Reactions  . Keflex [Cephalexin]     hives      Review of Systems: See HPI for pertinent ROS. All other ROS negative.    Physical Exam: Blood pressure 130/78, pulse 60, temperature 98  F (36.7 C), temperature source Oral, resp. rate 16, height 5\' 6"  (1.676 m), weight 233 lb 3.2 oz (105.8 kg), last menstrual period 08/19/2016, SpO2 98 %., Body mass index is 37.64 kg/m. General:  Obese AAF. Appears in no acute distress. Neck: Supple. No thyromegaly. No lymphadenopathy. Lungs: Clear bilaterally to auscultation without wheezes, rales, or rhonchi. Breathing is unlabored. Heart: Regular rhythm. No murmurs, rubs, or gallops. Msk:  Strength and tone normal for age. Extremities/Skin: Left 4th finger----PIP joint---swelling at this site is present. Some ecchymosis present at this site also. Remainder of hand appears normal.  ROM at this PIP joint is decreased secondary to swelling and pain.  ROM of other joints of left 4th finger normal. Neurovascularly intact. Neuro: Alert and oriented X 3. Moves all extremities spontaneously. Gait is normal. CNII-XII grossly in tact. Psych:  Responds to questions appropriately with a normal affect.     ASSESSMENT AND PLAN:  36 y.o. year old female with  1. Other sprain of left middle finger, initial encounter Discussed that we can get x-ray to rule out fracture but she says that she feels quite certain that she did not fracture any bones.Defers XRay at this time. Reassured her that the swelling and ecchymosis are just secondary to this sprain. Reassured her that the decreased range of motion  secondary to the swelling and pain. Buddy taped  this finger to the middle finger to give it some stability and help decrease motion. She says that this makes it feel much better-- just to do this--  otherwise the finger felt like it was "hanging" -- feels much better to have this support. She can apply ice to the area. Follow-up if needed.   Signed, [address], [place], [facility] 08/21/2016 11:34 AM

## 2016-08-24 ENCOUNTER — Other Ambulatory Visit: Payer: Self-pay | Admitting: Gastroenterology

## 2016-08-24 NOTE — Telephone Encounter (Signed)
Can Humira be refilled? 

## 2016-08-24 NOTE — Telephone Encounter (Signed)
Yes we can refill it but please let her know she is due for basic labs (CBC, CMET) if you can order those as well as a clinic visit follow up with me if you can coordinate that. You can give her a 3 month supply of Humira until clinic visit with me. Thanks

## 2016-08-25 ENCOUNTER — Other Ambulatory Visit: Payer: Self-pay

## 2016-08-25 DIAGNOSIS — K50119 Crohn's disease of large intestine with unspecified complications: Secondary | ICD-10-CM

## 2016-09-13 ENCOUNTER — Other Ambulatory Visit: Payer: Self-pay | Admitting: Gastroenterology

## 2016-09-13 NOTE — Telephone Encounter (Signed)
Ok to refill Humira

## 2016-09-13 NOTE — Telephone Encounter (Signed)
Yes okay to refill but she is due for a clinic follow up if you can help coordinate

## 2016-09-28 ENCOUNTER — Encounter: Payer: Self-pay | Admitting: Family Medicine

## 2016-10-06 ENCOUNTER — Other Ambulatory Visit (INDEPENDENT_AMBULATORY_CARE_PROVIDER_SITE_OTHER): Payer: BC Managed Care – PPO

## 2016-10-06 DIAGNOSIS — K50119 Crohn's disease of large intestine with unspecified complications: Secondary | ICD-10-CM | POA: Diagnosis not present

## 2016-10-06 LAB — COMPREHENSIVE METABOLIC PANEL
ALT: 21 U/L (ref 0–35)
AST: 21 U/L (ref 0–37)
Albumin: 3.8 g/dL (ref 3.5–5.2)
Alkaline Phosphatase: 62 U/L (ref 39–117)
BILIRUBIN TOTAL: 0.5 mg/dL (ref 0.2–1.2)
BUN: 9 mg/dL (ref 6–23)
CALCIUM: 9.3 mg/dL (ref 8.4–10.5)
CO2: 29 meq/L (ref 19–32)
CREATININE: 1.03 mg/dL (ref 0.40–1.20)
Chloride: 105 mEq/L (ref 96–112)
GFR: 78.36 mL/min (ref >60.00)
Glucose, Bld: 99 mg/dL (ref 70–99)
Potassium: 4.2 mEq/L (ref 3.5–5.1)
SODIUM: 139 meq/L (ref 135–145)
Total Protein: 7.1 g/dL (ref 6.0–8.3)

## 2016-10-06 LAB — CBC WITH DIFFERENTIAL/PLATELET
BASOS ABS: 0 10*3/uL (ref 0.0–0.1)
Basophils Relative: 0.5 % (ref 0.0–3.0)
EOS ABS: 0.1 10*3/uL (ref 0.0–0.7)
Eosinophils Relative: 1.2 % (ref 0.0–5.0)
HEMATOCRIT: 39.2 % (ref 36.0–46.0)
HEMOGLOBIN: 13 g/dL (ref 12.0–15.0)
LYMPHS PCT: 33 % (ref 12.0–46.0)
Lymphs Abs: 2.7 10*3/uL (ref 0.7–4.0)
MCHC: 33.1 g/dL (ref 30.0–36.0)
MCV: 81.5 fl (ref 78.0–100.0)
Monocytes Absolute: 0.6 10*3/uL (ref 0.1–1.0)
Monocytes Relative: 7.7 % (ref 3.0–12.0)
NEUTROS ABS: 4.7 10*3/uL (ref 1.4–7.7)
Neutrophils Relative %: 57.6 % (ref 43.0–77.0)
PLATELETS: 300 10*3/uL (ref 150.0–400.0)
RBC: 4.81 Mil/uL (ref 3.87–5.11)
RDW: 12.8 % (ref 11.5–15.5)
WBC: 8.2 10*3/uL (ref 4.0–10.5)

## 2016-10-10 ENCOUNTER — Ambulatory Visit (INDEPENDENT_AMBULATORY_CARE_PROVIDER_SITE_OTHER): Payer: BC Managed Care – PPO | Admitting: Gastroenterology

## 2016-10-10 ENCOUNTER — Encounter: Payer: Self-pay | Admitting: Gastroenterology

## 2016-10-10 ENCOUNTER — Other Ambulatory Visit (INDEPENDENT_AMBULATORY_CARE_PROVIDER_SITE_OTHER): Payer: BC Managed Care – PPO

## 2016-10-10 VITALS — BP 124/78 | HR 95 | Ht 65.5 in | Wt 229.8 lb

## 2016-10-10 DIAGNOSIS — Z79899 Other long term (current) drug therapy: Secondary | ICD-10-CM

## 2016-10-10 DIAGNOSIS — K529 Noninfective gastroenteritis and colitis, unspecified: Secondary | ICD-10-CM

## 2016-10-10 DIAGNOSIS — K50119 Crohn's disease of large intestine with unspecified complications: Secondary | ICD-10-CM

## 2016-10-10 LAB — VITAMIN D 25 HYDROXY (VIT D DEFICIENCY, FRACTURES): VITD: 25.16 ng/mL — AB (ref 30.00–100.00)

## 2016-10-10 MED ORDER — PNEUMOCOCCAL VAC POLYVALENT 25 MCG/0.5ML IJ INJ
0.5000 mL | INJECTION | INTRAMUSCULAR | Status: AC
  Administered 2016-10-10: 0.5 mL via INTRAMUSCULAR

## 2016-10-10 MED ORDER — METHOTREXATE 2.5 MG PO TABS: 15.0000 mg | ORAL_TABLET | ORAL | 1 refills | Status: DC

## 2016-10-10 NOTE — Progress Notes (Signed)
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  Had paradoxical worsening on Lialda. 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 Eventually placed on Humira and had some initial improvement but had some elevated inflammatory markers which led to testing drug levels. She had subtherapeutic Humira levels (2.4) with low level AB present (26). Inflammatory markers were positive. She was started on methotrexate '15mg'$  po q week in May 7425 along with folic acid '1mg'$ . Humira was changed to '40mg'$  weekly at that time. Her antibodies to Humira went away and her Humira level went from 2.4 to 5.7.  On that regimen she's been feeling really well. She is having about 1-2 BMs per day. She is trying to eat healthier. No blood in the stools. No abdominal pains. Eating well, no nausea or vomiting. She is continuing methotrexate and folic acid. She stopped Humira as well about 2 months ago. She did not contact me about doing this. She states she wants to get off as many meds as possible. She is taking vitamin D 1500 IU / day.   Colonoscopy 04/07/2016 - mild ileal inflammation, small cecal patch of inflammation - otherwise normal colon - all biopsies returned as normal, in remission  BD Health Care Maintenance: Annual Flu Vaccine- 2017 UTD,  Pneumococcal Vaccineif receiving immunosuppression: - PVC13 UTD, PSV23 due Pap Smearannually if immunosuppressed - Date: 2017 UTD Skin check / derm eval for those on biologic / thiopuriner, yearly: Date  TB testingif on anti-TNF, yearly - Date November 2016 Vitamin Dscreening - DUE    Past Medical History:  Diagnosis Date  . Anemia   . Gastritis   . H/O seasonal allergies   . IBS (irritable bowel syndrome)   . Ulcerative colitis [facility])      Past Surgical History:  Procedure Laterality Date  . TUBAL LIGATION     Family History  Problem Relation Age of Onset  . Colon cancer Unknown        cousins and uncles    . Breast cancer Maternal Grandmother   . Diabetes Unknown        several maternal relatives  . Hypertension Unknown        several paternal and maternal relatives  . Irritable bowel syndrome Unknown        mother  . Immunodeficiency Father    Social History  Substance Use Topics  . Smoking status: Never Smoker  . Smokeless tobacco: Never Used  . Alcohol use 1.2 oz/week    2 Standard drinks or equivalent per week     Comment: occasionally   Current Outpatient Prescriptions  Medication Sig Dispense Refill  . cetirizine (ZYRTEC) 10 MG tablet Take 10 mg by mouth daily as needed for allergies.    . fluticasone (FLONASE) 50 MCG/ACT nasal spray Place 1 spray into both nostrils daily as needed for allergies. 48 g 2  . folic acid (FOLVITE) 1 MG tablet TAKE ONE DAILY WHEN STARTS METHOTREXATE 30 tablet 3  . HUMIRA PEN 40 MG/0.8ML PNKT INJECT ONE PEN (40 MG) SUBCUTANEOUSLY EVERY WEEK. REFRIGERATE. 12 each 0  . HUMIRA PEN 40 MG/0.8ML PNKT INJECT ONE PEN (40 MG) SUBCUTANEOUSLY EVERY WEEK. REFRIGERATE. 4 each 4  . methotrexate (RHEUMATREX) 2.5 MG tablet Take 6 tablets (15 mg total) by mouth once a week. Caution:Chemotherapy. Protect from light. Take 6 tablets weekly for a total of '15mg'$  once weekly 72 tablet 1   No current facility-administered medications for this visit.  Allergies  Allergen Reactions  . Keflex [Cephalexin]     hives     Review of Systems: All systems reviewed and negative except where noted in HPI.   Lab Results  Component Value Date   WBC 8.2 10/06/2016   HGB 13.0 10/06/2016   HCT 39.2 10/06/2016   MCV 81.5 10/06/2016   PLT 300.0 10/06/2016    Lab Results  Component Value Date   CREATININE 1.03 10/06/2016   BUN 9 10/06/2016   NA 139 10/06/2016   K 4.2 10/06/2016   CL 105 10/06/2016   CO2 29 10/06/2016   Lab Results  Component Value Date   ALT 21 10/06/2016   AST 21 10/06/2016   ALKPHOS 62 10/06/2016   BILITOT 0.5 10/06/2016     Physical  Exam: BP 124/78   Pulse 95   Ht 5' 5.5" (1.664 m)   Wt 229 lb 12.8 oz (104.2 kg)   SpO2 99%   BMI 37.66 kg/m  Constitutional: Pleasant,well-developed, female in no acute distress. HEENT: Normocephalic and atraumatic. Conjunctivae are normal. No scleral icterus. Neck supple.  Cardiovascular: Normal rate, regular rhythm.  Pulmonary/chest: Effort normal and breath sounds normal. No wheezing, rales or rhonchi. Abdominal: Soft, nondistended, nontender. . There are no masses palpable. No hepatomegaly. Extremities: no edema Lymphadenopathy: No cervical adenopathy noted. Neurological: Alert and oriented to person place and time. Skin: Skin is warm and dry. No rashes noted. Psychiatric: Normal mood and affect. Behavior is normal.   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  She was on Humira 40 mg once weekly and methotrexate 15 mg by mouth every week. Adding methotrexate and increasing Humira made her Humira antibodies go away, and made her Humira level therapeutic, at which point she felt much better. Her colonoscopy as above on this regimen with significant improvement, biopsies were normal throughout.  She's been feeling really well since her last visit. Unfortunately she stopped her Humira 2 months ago without telling me, has continued methotrexate. Took her a while to get into remission on the prior regimen. I'm concerned that she may flare on methotrexate monotherapy, and with intermittent use of Humira at risk for antibody recurrence and failure of the drug. I discussed this with her at length. If she is going to go back on Humira would recommend she start it immediately to minimize risk of immunogenicity. Following this discussion she strongly wishes to stop Humira and manage her Crohn's with current dose of methotrexate. She understands that if she wanted to use Humira in the future, she may not be as responsive to it and we will have to  use another option. I'm concerned she will need biologic therapy to maintain remission given her history so far. She understands these risks, yet wishes to continue with methotrexate monotherapy.   Plan is as outlined: - patient has self-stopped Humira, declined resumption, wishes to de-escalate therapy against advice - ahe will continue methotrexate monotherapy, to take once weekly, and folic acid daily - basic labs normal now, we'll repeat in 3 months along with CRP and ESR - vitamin D level to assess for appropriate repletion - due for PPSV23 vaccine to complete series - follow up in clinic in 3-4 months for reassessment - call in the interim if she develops a flare  Matheny Cellar, MD [facility]astro[facility] Pager [phone]

## 2016-10-10 NOTE — Patient Instructions (Signed)
If you are age 35 or older, your body mass index should be between 23-30. Your Body mass index is 37.66 kg/m. If this is out of the aforementioned range listed, please consider follow up with your Primary Care Provider.  If you are age 17 or younger, your body mass index should be between 19-25. Your Body mass index is 37.66 kg/m. If this is out of the aformentioned range listed, please consider follow up with your Primary Care Provider.   We have sent the following medications to your pharmacy for you to pick up at your convenience:  Methotrexate  You have been given your pneumonia injection today.  Your physician has requested that you go to the basement for the following lab work before leaving today:  Vitamin D  You will be contacted in a few months to come back and have labs drawn and you will also be due for a clinic visit in 4 months.  Thank you.

## 2016-10-11 ENCOUNTER — Other Ambulatory Visit: Payer: Self-pay

## 2016-10-11 DIAGNOSIS — E559 Vitamin D deficiency, unspecified: Secondary | ICD-10-CM

## 2016-10-26 ENCOUNTER — Encounter: Payer: Self-pay | Admitting: Family Medicine

## 2016-12-08 ENCOUNTER — Encounter: Payer: Self-pay | Admitting: Family Medicine

## 2017-01-03 ENCOUNTER — Telehealth: Payer: Self-pay

## 2017-01-03 ENCOUNTER — Other Ambulatory Visit: Payer: Self-pay

## 2017-01-03 DIAGNOSIS — K50119 Crohn's disease of large intestine with unspecified complications: Secondary | ICD-10-CM

## 2017-01-03 NOTE — Telephone Encounter (Signed)
Mailed pt letter to inform the labs are due for Crohn's on 03/12/16. Informed that she will need to come to our building and go down to the basement to have them drawn. We will contact her when they have been reviewed.

## 2017-01-04 ENCOUNTER — Encounter: Payer: Self-pay | Admitting: Gastroenterology

## 2017-01-22 ENCOUNTER — Other Ambulatory Visit: Payer: Self-pay

## 2017-01-22 ENCOUNTER — Encounter: Payer: Self-pay | Admitting: Family Medicine

## 2017-01-22 ENCOUNTER — Ambulatory Visit: Payer: BC Managed Care – PPO | Admitting: Family Medicine

## 2017-01-22 VITALS — BP 122/68 | HR 82 | Temp 98.5°F | Resp 16 | Ht 65.5 in | Wt 233.0 lb

## 2017-01-22 DIAGNOSIS — K529 Noninfective gastroenteritis and colitis, unspecified: Secondary | ICD-10-CM

## 2017-01-22 DIAGNOSIS — F418 Other specified anxiety disorders: Secondary | ICD-10-CM | POA: Diagnosis not present

## 2017-01-22 NOTE — Assessment & Plan Note (Signed)
Physical situation with her job.  I am to keep her out of work for tomorrow she can return on Wednesday.  She is going to take tomorrow to set up some psychotherapy.  She is also going to meet with her supervisor and administrative team to help sort out some logistics.  At this time will hold off on any medications.  She is not displaying any signs of harm to herself or anyone else.  She knows to call if there are any changes in her mood.  We will follow-up in a few weeks and see how she is doing.  To her inflammatory bowel disorder she declines taking any medications.  She is aware that she can call her gastroenterologist as needed.

## 2017-01-22 NOTE — Progress Notes (Signed)
Subjective:    Patient ID: Theresa Farmer, female    DOB: 16-Sep-1980, 36 y.o.   MRN: [id number]  Patient presents for IBS (had to take Friday and Monday off work D/T IBS) and Increased Anxiety (states that IBS flares when anxiety is high)  Pt has IBD, followed by GI, missed work on Friday and Monday, needs documentation of her disease. She has not been taking  is on methotreaxate and folic acid states that she believes a more spiritual healing and in general has done okay unless she gets very anxious.  She then delve into the specifics of her visit.  She has been having increased stress and anxiety especially with work.  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  The child acted out during a Loss adjuster, charteredfire drill and they felt like she should have been there one-on-one with him which is thought in her actual job prescription as she has multiple children that she has a handle at the school however the administration did not see it that way.  She received multiple emails from the administration about how she should have handled things even though she did have protocol in place for the child in for the teachers.  She feels like she is been slighted.  She does have some support from her supervisor.  She became very overwhelmed on Thursday after receiving all these messages and had a panic attack.  She went home Thursday evening and did not return to school Friday or today.  She states that she does have anxiety and depression that runs in her family her mother is actually on Zoloft.  She felt like she typically handle stress very well including things with her children but this has been very overwhelming especially this job in the social economic status of some the family she is working with.  She is willing to go to psychotherapy she thinks it will help but she is reluctant to go on medications for this or for her  bowels.    Review Of Systems:  GEN- denies fatigue, fever, weight loss,weakness, recent illness HEENT- denies eye drainage, change in vision, nasal discharge, CVS- denies chest pain, palpitations RESP- denies SOB, cough, wheeze ABD- denies N/V, change in stools, abd pain GU- denies dysuria, hematuria, dribbling, incontinence MSK- denies joint pain, muscle aches, injury Neuro- denies headache, dizziness, syncope, seizure activity       Objective:    BP 122/68   Pulse 82   Temp 98.5 F (36.9 C) (Oral)   Resp 16   Ht 5' 5.5" (1.664 m)   Wt 233 lb (105.7 kg)   SpO2 97%   BMI 38.18 kg/m  GEN- NAD, alert and oriented x3 Psych- Tearful at times, not overly anxious, or depressed, well groomed, normal speech, no SI         Assessment & Plan:      Problem List Items Addressed This Visit      Unprioritized   Situational anxiety - Primary    Physical situation with her job.  I am to keep her out of work for tomorrow she can return on Wednesday.  She is going to take tomorrow to set up some psychotherapy.  She is also going to meet with her supervisor and administrative team to help sort out some logistics.  At this time will hold off on any medications.  She is not displaying  any signs of harm to herself or anyone else.  She knows to call if there are any changes in her mood.  We will follow-up in a few weeks and see how she is doing.  To her inflammatory bowel disorder she declines taking any medications.  She is aware that she can call her gastroenterologist as needed.      IBD (inflammatory bowel disease)      Note: This dictation was prepared with Dragon dictation along with smaller phrase technology. Any transcriptional errors that result from this process are unintentional.

## 2017-01-22 NOTE — Patient Instructions (Addendum)
Referral to therapy  F/U 4  weeks  [facility]

## 2017-02-19 ENCOUNTER — Ambulatory Visit: Payer: BC Managed Care – PPO | Admitting: Family Medicine

## 2017-02-23 ENCOUNTER — Encounter: Payer: Self-pay | Admitting: Gastroenterology

## 2017-04-25 ENCOUNTER — Encounter: Payer: Self-pay | Admitting: Family Medicine

## 2017-05-15 ENCOUNTER — Ambulatory Visit
Admission: RE | Admit: 2017-05-15 | Discharge: 2017-05-15 | Disposition: A | Discharge status: Home / Self Care | Payer: BC Managed Care – PPO | Source: Ambulatory Visit | Attending: Family Medicine | Admitting: Family Medicine

## 2017-05-15 ENCOUNTER — Encounter: Payer: Self-pay | Admitting: Family Medicine

## 2017-05-15 ENCOUNTER — Ambulatory Visit: Payer: BC Managed Care – PPO | Admitting: Family Medicine

## 2017-05-15 VITALS — BP 124/82 | HR 76 | Temp 98.0°F | Resp 16 | Wt 232.6 lb

## 2017-05-15 DIAGNOSIS — M25532 Pain in left wrist: Secondary | ICD-10-CM | POA: Diagnosis not present

## 2017-05-15 MED ORDER — PREDNISONE 10 MG PO TABS: ORAL_TABLET | ORAL | 0 refills | Status: DC

## 2017-05-15 NOTE — Patient Instructions (Signed)
We will get an X-ray and start a steroidal antiinflammatory.  I will follow up with you when I get the x-ray results.     Follow up in 1-2 weeks.  Immobilize with ace wrap or coband, ice 10-20 min 3 x a day    Wrist Pain, Adult There are many things that can cause wrist pain. Some common causes include:  An injury to the wrist area, such as a sprain, strain, or fracture.  Overuse of the joint.  A condition that causes increased pressure on a nerve in the wrist (carpal tunnel syndrome).  Wear and tear of the joints that occurs with aging (osteoarthritis).  A variety of other types of arthritis.  Sometimes, the cause of wrist pain is not known. Often, the pain goes away when you follow instructions from your health care provider for relieving pain at home, such as resting or icing the wrist. If your wrist pain continues, it is important to tell your health care provider. Follow these instructions at home:  Rest the wrist area for at least 48 hours or as long as told by your health care provider.  If a splint or elastic bandage has been applied, use it as told by your health care provider. ? Remove the splint or bandage only as told by your health care provider. ? Loosen the splint or bandage if your fingers tingle, become numb, or turn cold or blue.  If directed, apply ice to the injured area. ? If you have a removable splint or elastic bandage, remove it as told by your health care provider. ? Put ice in a plastic bag. ? Place a towel between your skin and the bag or between your splint or bandage and the bag. ? Leave the ice on for 20 minutes, 2-3 times a day.  Keep your arm raised (elevated) above the level of your heart while you are sitting or lying down.  Take over-the-counter and prescription medicines only as told by your health care provider.  Keep all follow-up visits as told by your health care provider. This is important. Contact a health care provider if:  You  have a sudden sharp pain in the wrist, hand, or arm that is different or new.  The swelling or bruising on your wrist or hand gets worse.  Your skin becomes red, gets a rash, or has open sores.  Your pain does not get better or it gets worse. Get help right away if:  You lose feeling in your fingers or hand.  Your fingers turn white, very red, or cold and blue.  You cannot move your fingers.  You have a fever or chills. This information is not intended to replace advice given to you by your health care provider. Make sure you discuss any questions you have with your health care provider. Document Released: 11/16/2004 Document Revised: 09/02/2015 Document Reviewed: 08/26/2015 Elsevier Interactive Patient Education  [employer]2018 [employer]

## 2017-05-15 NOTE — Progress Notes (Signed)
Subjective:    Patient ID: Theresa Farmer, female    DOB: [birth year], 37 y.o.   MRN: [id number]  Chief Complaint  Patient presents with  . knot on left wrist  . pain in arms    Wrist Pain   The pain is present in the left wrist. This is a new problem. The current episode started 1 to 4 weeks ago (3 weeks ago). There has been no history of extremity trauma. The problem occurs constantly. The problem has been gradually worsening. The quality of the pain is described as sharp. The pain is at a severity of 3/10. Associated symptoms include joint swelling, a limited range of motion and stiffness. Pertinent negatives include no fever, inability to bear weight, itching, joint locking, numbness or tingling. The symptoms are aggravated by activity and cold. She has tried acetaminophen and rest for the symptoms. The treatment provided no relief. Family history does not include gout or rheumatoid arthritis. There is no history of diabetes, gout, osteoarthritis or rheumatoid arthritis.    Wrist Pain: Patient complaints of left wrist pain and swelling described as a "knot". Pt denies injury. The pain began 3 weeks ago. The pain is located primarily in the left medial volar wrist.  She describes the symptoms as dull, sharp and shooting.  Dull pain is constant, rated 3/10, pain becomes sharp and shooting with any movement.  She has associated swelling.  Denies associated redness, numbness, tingling.  She denies fall or strain. Symptoms improve with immobilization and with taking prednisone before work. The symptoms are worse with movement, palpation, typing at work or home and pain worsens when wearing braces that she has for "carpal tunnel". The patient denies neck pain.  Treatment to date has been wrist braces, tylenol and prednisone, with only temporary relief.  Pt also complains of intermittent joint pain in bilateral upper extremities, onset one year ago,  Joints include shoulders, elbows, and wrists.  Sx  occurs at night, more often associated with cold weather or weather changes, and she feels stiff, and this continues into the next morning.  She is concerned that new sx may be med side effects.  Past Medical History:  Diagnosis Date  . Anemia   . Gastritis   . H/O seasonal allergies   . IBS (irritable bowel syndrome)   . Ulcerative colitis [facility])    Past Surgical History:  Procedure Laterality Date  . TUBAL LIGATION     Family History  Problem Relation Age of Onset  . Colon cancer Unknown        cousins and uncles  . Breast cancer Maternal Grandmother   . Diabetes Unknown        several maternal relatives  . Hypertension Unknown        several paternal and maternal relatives  . Irritable bowel syndrome Unknown        mother  . Immunodeficiency Father    Social History   Tobacco Use  . Smoking status: Never Smoker  . Smokeless tobacco: Never Used  Substance Use Topics  . Alcohol use: Yes    Alcohol/week: 1.2 oz    Types: 2 Standard drinks or equivalent per week    Comment: occasionally  . Drug use: No   Current Outpatient Medications on File Prior to Visit  Medication Sig Dispense Refill  . cetirizine (ZYRTEC) 10 MG tablet Take 10 mg by mouth daily as needed for allergies.    . fluticasone (FLONASE) 50 MCG/ACT nasal spray Place 1 spray  into both nostrils daily as needed for allergies. 48 g 2  . folic acid (FOLVITE) 1 MG tablet TAKE ONE DAILY WHEN STARTS METHOTREXATE 30 tablet 3  . methotrexate (RHEUMATREX) 2.5 MG tablet Take 2.5 mg by mouth once a week. Take 6 tablets once a week  1   No current facility-administered medications on file prior to visit.      Review of Systems  Constitutional: Negative.  Negative for fever.  HENT: Negative.   Respiratory: Negative.   Cardiovascular: Negative.   Gastrointestinal: Negative.   Genitourinary: Negative.   Musculoskeletal: Positive for arthralgias and stiffness. Negative for back pain, gait problem, gout, joint  swelling, myalgias, neck pain and neck stiffness.  Skin: Negative.  Negative for color change, itching and rash.  Neurological: Negative for tingling and numbness.  Hematological: Negative for adenopathy.  Psychiatric/Behavioral: Negative.   All other systems reviewed and are negative.      Objective:   Physical Exam  Constitutional: She appears well-developed and well-nourished. No distress.  HENT:  Head: Normocephalic and atraumatic.  Nose: Nose normal.  Eyes: Conjunctivae are normal. Right eye exhibits no discharge. Left eye exhibits no discharge. No scleral icterus.  Neck: Normal range of motion. Neck supple. No tracheal deviation present.  Cardiovascular: Normal rate and intact distal pulses.  Pulmonary/Chest: Effort normal. No respiratory distress.  Musculoskeletal:       Left wrist: She exhibits decreased range of motion (decreased flexion, normal extension), tenderness, bony tenderness and swelling. She exhibits no effusion, no crepitus and no deformity.  Left wrist - Positive tinel's Pt cannot flex left wrist enough to do phalen's test Decreased left hand grip strength Left medial volar wrist with swelling and faint bruising, no nodules palpated, no erythema, no induration, no crepitus No left snuff box tenderness Normal capillary refill of hands b/l Radial pulse 2+ b/l Skin intact   Neurological: She is alert. She exhibits normal muscle tone. Coordination normal.  Skin: Skin is warm and dry. She is not diaphoretic. No erythema.  Psychiatric: She has a normal mood and affect. Her behavior is normal.  Nursing note and vitals reviewed.  Vitals:   05/15/17 0811  BP: 124/82  Pulse: 76  Resp: 16  Temp: 98 F (36.7 C)  SpO2: 98%        Assessment & Plan:   1. Left wrist pain  No known injury, but swelling and bruising.  Pt complains of nodule, no palpated discreet nodule.  ROM decreased and strength decreased, likely secondary to pain and mild volar wrist  swelling.  Wrapped pt in brace with gauze and coband.  Instructed her to get 2" ace wrap to help immobilize and give compression.  RICE tx.  For now d/c using carpal tunnel braces.  Will rx short taper of prednisone for inflammation and since it seems to be the only thing helping decrease her pain and swelling during the days she takes left over prednisone. Xray to eval joint space.  Very unlikely there would be bony injury however physical exam is similar to wrist fx.   Reviewed chart and did not find documentation of formal carpal tunnel dx.  May need this.    - DG Wrist Complete Left Meds ordered this encounter  Medications  . predniSONE (DELTASONE) 10 MG tablet    Sig: 3 tabs poqday 1-4, 2 tabs poqday 5-8, 1 tab poqday 9-12    Dispense:  24 tablet    Refill:  0    Order Specific Question:  Supervising Provider    Answer:   Salley ScarletURHAM, KAWANTA F [[id number]]   Follow up wrist in 2 weeks for recheck.  Pt also concerned about intermittent bilateral UE stiffness, concerned for arthritis.  May need future work up for this if sx continue.

## 2017-05-16 NOTE — Progress Notes (Signed)
Left wrist xray negative - pt to be notified by staff

## 2017-05-29 ENCOUNTER — Ambulatory Visit: Payer: BC Managed Care – PPO | Admitting: Family Medicine

## 2017-06-15 ENCOUNTER — Ambulatory Visit: Payer: BC Managed Care – PPO | Admitting: Family Medicine

## 2017-06-15 ENCOUNTER — Other Ambulatory Visit: Payer: Self-pay

## 2017-06-15 ENCOUNTER — Encounter: Payer: Self-pay | Admitting: Family Medicine

## 2017-06-15 VITALS — BP 122/76 | HR 82 | Temp 98.3°F | Resp 14 | Ht 65.5 in | Wt 229.0 lb

## 2017-06-15 DIAGNOSIS — G5602 Carpal tunnel syndrome, left upper limb: Secondary | ICD-10-CM | POA: Diagnosis not present

## 2017-06-15 DIAGNOSIS — M25532 Pain in left wrist: Secondary | ICD-10-CM | POA: Diagnosis not present

## 2017-06-15 DIAGNOSIS — M25432 Effusion, left wrist: Secondary | ICD-10-CM | POA: Diagnosis not present

## 2017-06-15 MED ORDER — VITAMIN C 500 MG PO TABS: 500.0000 mg | ORAL_TABLET | Freq: Every day | ORAL | Status: AC

## 2017-06-15 MED ORDER — CHOLECALCIFEROL 125 MCG (5000 UT) PO CAPS: 10000.0000 [IU] | ORAL_CAPSULE | Freq: Every day | ORAL | Status: AC

## 2017-06-15 NOTE — Patient Instructions (Addendum)
Referral to hand surgeon  F/U as needed  

## 2017-06-15 NOTE — Progress Notes (Signed)
   Subjective:    Patient ID: Theresa Farmer, female    DOB: 12/01/1980, 37 y.o.   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 as she has had wirst pain and swelling on and off in the past   Crohn's on methotrexate   [facility]  Review Of Systems:  GEN- denies fatigue, fever, weight loss,weakness, recent illness HEENT- denies eye drainage, change in vision, nasal discharge, CVS- denies chest pain, palpitations RESP- denies SOB, cough, wheeze ABD- denies N/V, change in stools, abd pain MSK- + joint pain, muscle aches, injury Neuro- denies headache, dizziness, syncope, seizure activity       Objective:    BP 122/76   Pulse 82   Temp 98.3 F (36.8 C) (Oral)   Resp 14   Ht 5' 5.5" (1.664 m)   Wt 229 lb (103.9 kg)   SpO2 98%   BMI 37.53 kg/m  GEN- NAD, alert and oriented x3 MSK- bilat wrist FROM, Left wirst, TTP over radial palmar surface of wrist,mild swelling, normal grasp, neg finkelsteins, normal fist,  FROM fingers Neuro- normal tone bilat UE, sensation in tact        Assessment & Plan:      Problem List Items Addressed This Visit    None    Visit Diagnoses    Pain and swelling of left wrist    -  Primary   Referral to hand surgeon, tylenol prn pain, has brace but causing more discomfort, can use ACE wrap or sleeve instead   Relevant Orders   Ambulatory referral to Hand Surgery   Carpal tunnel syndrome of left wrist       Relevant Orders   Ambulatory referral to Hand Surgery      Note: This dictation was prepared with Dragon  dictation along with smaller phrase technology. Any transcriptional errors that result from this process are unintentional.

## 2017-06-27 ENCOUNTER — Other Ambulatory Visit: Payer: Self-pay | Admitting: Gastroenterology

## 2017-11-06 ENCOUNTER — Encounter (HOSPITAL_COMMUNITY): Payer: Self-pay | Admitting: Emergency Medicine

## 2017-11-06 ENCOUNTER — Other Ambulatory Visit: Payer: Self-pay

## 2017-11-06 ENCOUNTER — Emergency Department (HOSPITAL_COMMUNITY)
Admission: EM | Admit: 2017-11-06 | Discharge: 2017-11-07 | Disposition: A | Discharge status: Home / Self Care | Payer: BC Managed Care – PPO | Attending: Emergency Medicine | Admitting: Emergency Medicine

## 2017-11-06 ENCOUNTER — Emergency Department (HOSPITAL_COMMUNITY): Payer: BC Managed Care – PPO

## 2017-11-06 DIAGNOSIS — M79602 Pain in left arm: Secondary | ICD-10-CM | POA: Diagnosis not present

## 2017-11-06 DIAGNOSIS — Z79899 Other long term (current) drug therapy: Secondary | ICD-10-CM | POA: Insufficient documentation

## 2017-11-06 LAB — CBC WITH DIFFERENTIAL/PLATELET
ABS IMMATURE GRANULOCYTES: 0 10*3/uL (ref 0.0–0.1)
BASOS ABS: 0.1 10*3/uL (ref 0.0–0.1)
BASOS PCT: 1 %
EOS ABS: 0.2 10*3/uL (ref 0.0–0.7)
Eosinophils Relative: 2 %
HCT: 36.9 % (ref 36.0–46.0)
Hemoglobin: 11.3 g/dL — ABNORMAL LOW (ref 12.0–15.0)
Immature Granulocytes: 0 %
Lymphocytes Relative: 24 %
Lymphs Abs: 2.4 10*3/uL (ref 0.7–4.0)
MCH: 25.2 pg — AB (ref 26.0–34.0)
MCHC: 30.6 g/dL (ref 30.0–36.0)
MCV: 82.2 fL (ref 78.0–100.0)
MONO ABS: 1 10*3/uL (ref 0.1–1.0)
MONOS PCT: 10 %
NEUTROS ABS: 6.2 10*3/uL (ref 1.7–7.7)
Neutrophils Relative %: 63 %
PLATELETS: 424 10*3/uL — AB (ref 150–400)
RBC: 4.49 MIL/uL (ref 3.87–5.11)
RDW: 12.8 % (ref 11.5–15.5)
WBC: 9.9 10*3/uL (ref 4.0–10.5)

## 2017-11-06 LAB — BASIC METABOLIC PANEL
ANION GAP: 10 (ref 5–15)
BUN: 7 mg/dL (ref 6–20)
CO2: 24 mmol/L (ref 22–32)
Calcium: 9 mg/dL (ref 8.9–10.3)
Chloride: 106 mmol/L (ref 98–111)
Creatinine, Ser: 1.02 mg/dL — ABNORMAL HIGH (ref 0.44–1.00)
Glucose, Bld: 113 mg/dL — ABNORMAL HIGH (ref 70–99)
POTASSIUM: 3.4 mmol/L — AB (ref 3.5–5.1)
SODIUM: 140 mmol/L (ref 135–145)

## 2017-11-06 MED ORDER — OXYCODONE-ACETAMINOPHEN 5-325 MG PO TABS
1.0000 | ORAL_TABLET | Freq: Once | ORAL | Status: AC
  Administered 2017-11-06: 1 via ORAL
  Filled 2017-11-06: qty 1

## 2017-11-06 MED ORDER — ACETAMINOPHEN 325 MG PO TABS
650.0000 mg | ORAL_TABLET | Freq: Once | ORAL | Status: AC
  Administered 2017-11-07: 650 mg via ORAL
  Filled 2017-11-06: qty 2

## 2017-11-06 NOTE — ED Triage Notes (Signed)
Patient with ganglion cyst in left wrist.  She states that the pain and swelling have gotten worse.  She states that she has not had any injury to the arm.

## 2017-11-06 NOTE — ED Provider Notes (Signed)
[facility] EMERGENCY DEPARTMENT Provider Note   CSN: [id number] Arrival date & time: 11/06/17  2135     History   Chief Complaint Chief Complaint  Patient presents with  . Arm Pain    HPI Theresa Farmer is a 37 y.o. female.  37 year old female presents with complaint of pain in her left hand, wrist, elbow.  Patient states that she has had a cyst on her left wrist for the past 6 months or so that comes and goes.  Patient works at a desk with keyboard, states that she started wearing a brace on her wrist and trying to pad the area which is helped with the swelling of the cyst.  Patient states for the past week her pain has gotten progressively worse with swelling now in her hand, pain with trying to flex her fingers or move her wrist, also pain with trying to fully extend her left elbow.  She denies any falls or injuries, has a history of IBS, is currently taking clindamycin for pain in her mouth after wisdom tooth extraction 2 weeks ago.  No other complaints or concerns.     Past Medical History:  Diagnosis Date  . Anemia   . Gastritis   . H/O seasonal allergies   . IBS (irritable bowel syndrome)   . Ulcerative colitis [facility])     Patient Active Problem List   Diagnosis Date Noted  . IBD (inflammatory bowel disease) 12/15/2014  . Situational anxiety 12/15/2014  . Rectal bleeding 11/20/2014  . Obesity 01/11/2013  . Unspecified constipation 01/11/2013  . Gastritis     Past Surgical History:  Procedure Laterality Date  . TUBAL LIGATION       OB History   None      Home Medications    Prior to Admission medications   Medication Sig Start Date End Date Taking? Authorizing Provider  cetirizine (ZYRTEC) 10 MG tablet Take 10 mg by mouth daily as needed for allergies.    [provider]  Cholecalciferol (D-3-5) 5000 units capsule Take 2 capsules (10,000 Units total) by mouth daily. 06/15/17   [place], Velna Hatchet, MD  fluticasone [facility]) 50  MCG/ACT nasal spray Place 1 spray into both nostrils daily as needed for allergies. 06/22/16   Salley Scarlet, MD  folic acid (FOLVITE) 1 MG tablet TAKE ONE DAILY WHEN STARTS METHOTREXATE 01/24/16   Armbruster, Willaim Rayas, MD  HYDROcodone-acetaminophen (NORCO/VICODIN) 5-325 MG tablet Take 1 tablet by mouth every 4 (four) hours as needed. 11/07/17   Jeannie Fend, PA-C  methotrexate (RHEUMATREX) 2.5 MG tablet Take 2.5 mg by mouth once a week. Take 6 tablets once a week 04/19/17   [provider]  vitamin C (ASCORBIC ACID) 500 MG tablet Take 1 tablet (500 mg total) by mouth daily. 06/15/17   Salley Scarlet, MD    Family History Family History  Problem Relation Age of Onset  . Colon cancer Unknown        cousins and uncles  . Breast cancer Maternal Grandmother   . Diabetes Unknown        several maternal relatives  . Hypertension Unknown        several paternal and maternal relatives  . Irritable bowel syndrome Unknown        mother  . Immunodeficiency Father     Social History Social History   Tobacco Use  . Smoking status: Never Smoker  . Smokeless tobacco: Never Used  Substance Use Topics  .  Alcohol use: Yes    Alcohol/week: 2.0 standard drinks    Types: 2 Standard drinks or equivalent per week    Comment: occasionally  . Drug use: No     Allergies   Keflex [cephalexin]   Review of Systems Review of Systems  Constitutional: Negative for fever.  Gastrointestinal: Negative for nausea and vomiting.  Musculoskeletal: Positive for arthralgias, joint swelling and myalgias.  Skin: Positive for color change. Negative for rash and wound.  Allergic/Immunologic: Negative for immunocompromised state.  Neurological: Negative for weakness and numbness.  Hematological: Negative for adenopathy. Does not bruise/bleed easily.  Psychiatric/Behavioral: Negative for confusion.  All other systems reviewed and are negative.    Physical Exam Updated Vital Signs BP (!)  144/102 (BP Location: Right Arm)   Pulse 98   Temp 98.8 F (37.1 C) (Oral)   Resp 16   LMP 10/09/2017 (Approximate)   SpO2 100%   Physical Exam  Constitutional: She is oriented to person, place, and time. She appears well-developed and well-nourished. No distress.  HENT:  Head: Normocephalic and atraumatic.  Cardiovascular: Intact distal pulses.  Pulmonary/Chest: Effort normal.  Musculoskeletal: She exhibits tenderness. She exhibits no deformity.       Left elbow: She exhibits decreased range of motion. She exhibits no swelling and no effusion. Tenderness found.       Left wrist: She exhibits decreased range of motion and tenderness. She exhibits no swelling, no effusion and no crepitus.       Left hand: She exhibits decreased range of motion, tenderness and swelling. She exhibits normal capillary refill and no deformity. Normal sensation noted.       Hands: Neurological: She is alert and oriented to person, place, and time. No sensory deficit.  Skin: Skin is warm and dry. She is not diaphoretic. No erythema.  Psychiatric: She has a normal mood and affect. Her behavior is normal.  Nursing note and vitals reviewed.    ED Treatments / Results  Labs (all labs ordered are listed, but only abnormal results are displayed) Labs Reviewed  BASIC METABOLIC PANEL - Abnormal; Notable for the following components:      Result Value   Potassium 3.4 (*)    Glucose, Bld 113 (*)    Creatinine, Ser 1.02 (*)    All other components within normal limits  CBC WITH DIFFERENTIAL/PLATELET - Abnormal; Notable for the following components:   Hemoglobin 11.3 (*)    MCH 25.2 (*)    Platelets 424 (*)    All other components within normal limits  SEDIMENTATION RATE - Abnormal; Notable for the following components:   Sed Rate 78 (*)    All other components within normal limits    EKG None  Radiology Dg Hand Complete Left  Result Date: 11/06/2017 CLINICAL DATA:  Pain and swelling EXAM: LEFT HAND  - COMPLETE 3+ VIEW COMPARISON:  05/15/2017 FINDINGS: There is no evidence of fracture or dislocation. There is no evidence of arthropathy or other focal bone abnormality. Soft tissues are unremarkable. IMPRESSION: Negative. Electronically Signed   By: Jasmine Pang M.D.   On: 11/06/2017 23:19    Procedures Procedures (including critical care time)  Medications Ordered in ED Medications  oxyCODONE-acetaminophen (PERCOCET/ROXICET) 5-325 MG per tablet 1 tablet (1 tablet Oral Given 11/06/17 2244)  acetaminophen (TYLENOL) tablet 650 mg (650 mg Oral Given 11/07/17 0013)     Initial Impression / Assessment and Plan / ED Course  I have reviewed the triage vital signs and the nursing notes.  Pertinent labs & imaging results that were available during my care of the patient were reviewed by me and considered in my medical decision making (see chart for details).  Clinical Course as of Nov 07 33  Wed Nov 07, 2017  [id number] 37 year old female with history of ganglion cyst in the left wrist presents with pain in the left hand, wrist, elbow.  Patient states she was seen several months ago by hand specialist and advised to modify work to prevent irritation of the ganglion cyst area.  Patient has made modifications and states pain has been better until this past week.  She denies injuries or wounds to the hand.  Reports pain with flexion of her fingers or any movement of the wrist, hand, elbow.  Patient was given Percocet and Tylenol for her pain, pain is improved and is now localized to the area of the ganglion cyst.  Lab work shows a normal white count, unremarkable chemistry and elevated sed rate at 78.  X-ray is unremarkable.  Advised patient to return to the ER for any worsening or concerning symptoms otherwise she is to follow back up with her hand specialist.  Patient is currently taking clindamycin once daily for a dental infection, advised patient that she should take this as prescribed (she was taking it  once daily around her work schedule).  Given prescription for Norco for her more severe pain not controlled with Motrin and Tylenol.  Patient verbalizes understanding of her discharge instructions and plan.   [LM]    Clinical Course User Index [LM] Jeannie FendMurphy, Laura A, PA-C    Final Clinical Impressions(s) / ED Diagnoses   Final diagnoses:  Left arm pain    ED Discharge Orders         Ordered    HYDROcodone-acetaminophen (NORCO/VICODIN) 5-325 MG tablet  Every 4 hours PRN     11/07/17 0031           Jeannie FendMurphy, Laura A, PA-C 11/07/17 [id number]    Raeford RazorKohut, Stephen, MD 11/15/17 1332

## 2017-11-07 ENCOUNTER — Ambulatory Visit: Payer: BC Managed Care – PPO | Admitting: Physician Assistant

## 2017-11-07 ENCOUNTER — Encounter: Payer: Self-pay | Admitting: Physician Assistant

## 2017-11-07 ENCOUNTER — Encounter: Payer: Self-pay | Admitting: Family Medicine

## 2017-11-07 VITALS — BP 130/76 | HR 104 | Temp 98.4°F | Resp 16 | Ht 65.5 in | Wt 219.8 lb

## 2017-11-07 DIAGNOSIS — M67432 Ganglion, left wrist: Secondary | ICD-10-CM | POA: Diagnosis not present

## 2017-11-07 LAB — SEDIMENTATION RATE: SED RATE: 78 mm/h — AB (ref 0–22)

## 2017-11-07 MED ORDER — HYDROCODONE-ACETAMINOPHEN 5-325 MG PO TABS: 1.0000 | ORAL_TABLET | ORAL | 0 refills | Status: DC | PRN

## 2017-11-07 NOTE — Progress Notes (Signed)
Patient ID: Theresa BoeckDesiree Gatchalian MRN: [id number], DOB: 11-Feb-1981, 37 y.o. Date of Encounter: 11/07/2017, 2:57 PM    Chief Complaint:  Chief Complaint  Patient presents with  . left hand swollen    pain      HPI: 37 y.o. year old female presents with above.   Reviewed ER note dated 11/06/2017.  Time she presented with complaint of pain in her left hand, wrist, elbow.  She reported that she had a cyst on her left wrist for the bouts past 6 months that would come and go.  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  However she presented to the ER stating that for the past week her pain had gotten progressively worse with swelling in her hand and pain when she would try to flex her fingers or move her wrist and also pain with trying to fully extend her left elbow.  Denied any falls or injuries.  Labs were obtained and were stable.  X-ray of the left hand was performed and negative.  In the ER she was given an oxycodone and a Tylenol.  Plan follow-up with her hand specialist Dr. Merlyn LotKuzma.  Today patient tells me that she had been told she had a ganglion cyst at the wrist several months ago.  States that the hand specialist had recommended her wear a splint to the wrist to decrease movement and inflammation of the area.  Says that subsequently she also added some padding near her keyboard and that seemed to help.  Says that things have been improved and stable until just all of a sudden the last couple of days.  States for the last 2 days all the sudden she started having swelling of the left wrist and hand.  Dates that yesterday all the sudden she had increased pain of those areas.  Dates that she is left-handed so was unable to write because unable to hold a pen without causing severe pain.  Said that it was to the point that the pain was even radiating up towards  the elbow.  States that when she called Dr. Merrilee SeashoreKuzma's office to schedule follow-up they have given her an appointment for next Tuesday.  States that she is continuing with pain swelling and throbbing and cannot possibly do her work/job.   ER did prescribe hydrocodone for pain management.     Home Meds:   Outpatient Medications Prior to Visit  Medication Sig Dispense Refill  . cetirizine (ZYRTEC) 10 MG tablet Take 10 mg by mouth daily as needed for allergies.    . Cholecalciferol (D-3-5) 5000 units capsule Take 2 capsules (10,000 Units total) by mouth daily.    . clindamycin (CLEOCIN) 300 MG capsule Take 300 mg by mouth every 6 (six) hours.  0  . fluticasone (FLONASE) 50 MCG/ACT nasal spray Place 1 spray into both nostrils daily as needed for allergies. 48 g 2  . folic acid (FOLVITE) 1 MG tablet TAKE ONE DAILY WHEN STARTS METHOTREXATE 30 tablet 3  . HYDROcodone-acetaminophen (NORCO/VICODIN) 5-325 MG tablet Take 1 tablet by mouth every 4 (four) hours as needed. 10 tablet 0  . methotrexate (RHEUMATREX) 2.5 MG tablet Take 2.5 mg by mouth once a week. Take 6 tablets once a week  1  . vitamin C (ASCORBIC ACID) 500 MG tablet Take 1  tablet (500 mg total) by mouth daily.     No facility-administered medications prior to visit.     Allergies:  Allergies  Allergen Reactions  . Keflex [Cephalexin]     hives      Review of Systems: See HPI for pertinent ROS. All other ROS negative.    Physical Exam: Blood pressure 130/76, pulse (!) 104, temperature 98.4 F (36.9 C), temperature source Oral, resp. rate 16, height 5' 5.5" (1.664 m), weight 99.7 kg, last menstrual period 10/09/2017, SpO2 98 %., Body mass index is 36.02 kg/m. General:  Obese AAF. Appears in no acute distress. Neck: Supple. No thyromegaly. No lymphadenopathy. Lungs: Clear bilaterally to auscultation without wheezes, rales, or rhonchi. Breathing is unlabored. Heart: Regular rhythm. No murmurs, rubs, or gallops. Msk:  Strength and  tone normal for age. Left Wrist: She has swelling, tenderness at radial aspect of plantar surface of left wrist. This area is very tender.  She has swelling of the left hand especially the proximal area of the hand and towards the thenar prominence region.  No erythema.  No warmth. Extremities/Skin: Warm and dry.  Neuro: Alert and oriented X 3. Moves all extremities spontaneously. Gait is normal. CNII-XII grossly in tact. Psych:  Responds to questions appropriately with a normal affect.     ASSESSMENT AND PLAN:  37 y.o. year old female with   1. Ganglion cyst of left wrist Give her a note to cover being out of work today through Tuesday.  She has her follow-up appointment with orthopedic hand specialist on Tuesday.  In the interim she is to keep her wrist in neutral position to minimize inflammation to the area.  She has hydrocodone prescribed from ER to use for severe pain otherwise can use over-the-counter meds for pain relief in the interim.   Murray Hodgkins [place], [place], [facility] 11/07/2017 2:57 PM

## 2017-11-07 NOTE — Discharge Instructions (Addendum)
Take clindamycin as previously prescribed for dental infection concern. Take Motrin and Tylenol as needed as directed for pain. Take Norco if needed for more severe pain. Alternate warm and cold compresses, elevate arm. Follow-up with orthopedics as discussed, return to ER for any worsening or concerning symptoms.

## 2017-11-14 ENCOUNTER — Other Ambulatory Visit: Payer: Self-pay | Admitting: *Deleted

## 2017-11-14 DIAGNOSIS — G5602 Carpal tunnel syndrome, left upper limb: Secondary | ICD-10-CM

## 2017-11-15 ENCOUNTER — Ambulatory Visit (INDEPENDENT_AMBULATORY_CARE_PROVIDER_SITE_OTHER): Payer: BC Managed Care – PPO | Admitting: Neurology

## 2017-11-15 DIAGNOSIS — G5602 Carpal tunnel syndrome, left upper limb: Secondary | ICD-10-CM

## 2017-11-15 DIAGNOSIS — M79642 Pain in left hand: Secondary | ICD-10-CM

## 2017-11-15 NOTE — Procedures (Signed)
[facility] Neurology  [address] [place], [address]  [place], [place] [address] Tel: [phone] Fax:  [phone] Test Date:  11/15/2017  Patient: Theresa Farmer DOB: 13-Apr-1980 Physician: Nita Sickle, DO  Sex: Female Height: 5\' 6"  Ref Phys: Betha Loa, MD  ID#: [id number] Temp: 38 C Technician:    Patient Complaints: This is a 37 year old female referred for evaluation of left wrist pain and paresthesias.  NCV & EMG Findings: Extensive electrodiagnostic testing of the left upper extremity and additional studies of the right shows:  1. Bilateral median, ulnar, and mixed palmar sensory responses are within normal limits. 2. Bilateral median and ulnar motor responses are within normal limits. 3. There is no evidence of active or chronic motor axonal loss changes affecting any of the tested muscles.  Motor unit configuration and recruitment pattern is within normal limits.  Impression: This is a normal study of the upper extremities.  In particular, there is no evidence of carpal tunnel syndrome or a left cervical radiculopathy.   ___________________________ Nita Sickle, DO    Nerve Conduction Studies Anti Sensory Summary Table   Site NR Peak (ms) Norm Peak (ms) P-T Amp (V) Norm P-T Amp  Left Median Anti Sensory (2nd Digit)  38C  Wrist    2.8 <3.4 44.7 >20  Right Median Anti Sensory (2nd Digit)  38C  Wrist    2.9 <3.4 44.1 >20  Left Ulnar Anti Sensory (5th Digit)  38C  Wrist    2.4 <3.1 33.1 >12  Right Ulnar Anti Sensory (5th Digit)  38C  Wrist    2.5 <3.1 39.3 >12   Motor Summary Table   Site NR Onset (ms) Norm Onset (ms) O-P Amp (mV) Norm O-P Amp Site1 Site2 Delta-0 (ms) Dist (cm) Vel (m/s) Norm Vel (m/s)  Left Median Motor (Abd Poll Brev)  38C  Wrist    2.7 <3.9 9.0 >6 Elbow Wrist 4.8 30.0 63 >50  Elbow    7.5  8.8         Right Median Motor (Abd Poll Brev)  38C  Wrist    2.6 <3.9 10.7 >6 Elbow Wrist 5.1 30.0 59 >50  Elbow    7.7  11.2         Left Ulnar  Motor (Abd Dig Minimi)  38C  Wrist    1.6 <3.1 9.0 >7 B Elbow Wrist 3.7 24.0 65 >50  B Elbow    5.3  8.4  A Elbow B Elbow 1.8 10.0 56 >50  A Elbow    7.1  8.1         Right Ulnar Motor (Abd Dig Minimi)  38C  Wrist    2.0 <3.1 12.0 >7 B Elbow Wrist 3.9 25.0 64 >50  B Elbow    5.9  11.1  A Elbow B Elbow 1.7 10.0 59 >50  A Elbow    7.6  10.6          Comparison Summary Table   Site NR Peak (ms) Norm Peak (ms) P-T Amp (V) Site1 Site2 Delta-P (ms) Norm Delta (ms)  Left Median/Ulnar Palm Comparison (Wrist - 8cm)  38C  Median Palm    1.6 <2.2 59.0 Median Palm Ulnar Palm 0.2   Ulnar Palm    1.4 <2.2 16.7      Right Median/Ulnar Palm Comparison (Wrist - 8cm)  38C  Median Palm    1.8 <2.2 38.6 Median Palm Ulnar Palm 0.3   Ulnar Palm    1.5 <2.2 39.7  EMG   Side Muscle Ins Act Fibs Psw Fasc Number Recrt Dur Dur. Amp Amp. Poly Poly. Comment  Left 1stDorInt Nml Nml Nml Nml Nml Nml Nml Nml Nml Nml Nml Nml N/A  Left PronatorTeres Nml Nml Nml Nml Nml Nml Nml Nml Nml Nml Nml Nml N/A  Left Biceps Nml Nml Nml Nml Nml Nml Nml Nml Nml Nml Nml Nml N/A  Left Triceps Nml Nml Nml Nml Nml Nml Nml Nml Nml Nml Nml Nml N/A  Left Deltoid Nml Nml Nml Nml Nml Nml Nml Nml Nml Nml Nml Nml N/A      Waveforms:

## 2017-11-21 DIAGNOSIS — M67432 Ganglion, left wrist: Secondary | ICD-10-CM | POA: Insufficient documentation

## 2017-11-21 DIAGNOSIS — M654 Radial styloid tenosynovitis [de Quervain]: Secondary | ICD-10-CM | POA: Insufficient documentation

## 2018-03-11 ENCOUNTER — Ambulatory Visit: Payer: BC Managed Care – PPO | Admitting: Family Medicine

## 2018-03-11 ENCOUNTER — Other Ambulatory Visit: Payer: Self-pay

## 2018-03-11 ENCOUNTER — Encounter: Payer: Self-pay | Admitting: Family Medicine

## 2018-03-11 VITALS — BP 128/70 | HR 80 | Temp 98.6°F | Resp 14 | Ht 65.5 in | Wt 216.0 lb

## 2018-03-11 DIAGNOSIS — Z13220 Encounter for screening for lipoid disorders: Secondary | ICD-10-CM | POA: Diagnosis not present

## 2018-03-11 DIAGNOSIS — K529 Noninfective gastroenteritis and colitis, unspecified: Secondary | ICD-10-CM

## 2018-03-11 DIAGNOSIS — L819 Disorder of pigmentation, unspecified: Secondary | ICD-10-CM

## 2018-03-11 NOTE — Patient Instructions (Signed)
F/U Schedule physical in a few months

## 2018-03-11 NOTE — Assessment & Plan Note (Signed)
Overall stable, watches her diet, trying to reduce stress which is a trigger, labs per above

## 2018-03-11 NOTE — Progress Notes (Signed)
   Subjective:    Patient ID: Theresa Farmer, female    DOB: 10-25-1980, 38 y.o.   MRN: [id number]  Patient presents for Labs (would like glucose and cholesterol checked) and Discoloration to Eyelids (x1 month- 2 bilateral areas of lightness to upper eyelids- appeared lighter at first but has faded- no changes in vision)  1 month ago, noticed two light spots on her upper lipids. Initially spots were larger, no itching or redness or pain in the spots.  Does not wear make up, uses shea butter only   Would also like cholesterol and blood sugar checked. When she eat certain foods, such as salt, [place] products she feels a little weird, states tongue will feel different. No diarrhea or vomiting.   He does have IBD    No rash elsewhere on her body  Ate half a banana    Uses hydrocodone as needed for her carpal tunnel   Review Of Systems:  GEN- denies fatigue, fever, weight loss,weakness, recent illness HEENT- denies eye drainage, change in vision, nasal discharge, CVS- denies chest pain, palpitations RESP- denies SOB, cough, wheeze ABD- denies N/V, change in stools, abd pain GU- denies dysuria, hematuria, dribbling, incontinence MSK- denies joint pain, muscle aches, injury Neuro- denies headache, dizziness, syncope, seizure activity       Objective:    BP 128/70   Pulse 80   Temp 98.6 F (37 C) (Oral)   Resp 14   Ht 5' 5.5" (1.664 m)   Wt 216 lb (98 kg)   SpO2 98%   BMI 35.40 kg/m  GEN- NAD, alert and oriented x3 HEENT- PERRL, EOMI, non injected sclera, pink conjunctiva, MMM, oropharynx clear, above bilat upper lids near corners hypopigmented macules  Neck- Supple, no thyromegaly CVS- RRR, no murmur RESP-CTAB ABD-NABS,soft,NT,ND EXT- No edema Pulses- Radial, DP- 2+        Assessment & Plan:      Problem List Items Addressed This Visit      Unprioritized   IBD (inflammatory bowel disease) - Primary    Overall stable, watches her diet, trying to reduce stress which  is a trigger, labs per above      Relevant Orders   CBC with Differential/Platelet   Comprehensive metabolic panel   TSH    Other Visit Diagnoses    Screening cholesterol level       cHECK LIPIDS   Relevant Orders   Lipid panel   Hypopigmented skin lesion       Lesions on eyelids appear tobe small cholesterol deposits, check labs per above, no topical given, no sign of irritation or classic dermatitis    Relevant Orders   TSH      Note: This dictation was prepared with Dragon dictation along with smaller phrase technology. Any transcriptional errors that result from this process are unintentional.

## 2018-03-12 LAB — COMPREHENSIVE METABOLIC PANEL
AG Ratio: 1 (calc) (ref 1.0–2.5)
ALT: 8 U/L (ref 6–29)
AST: 9 U/L — ABNORMAL LOW (ref 10–30)
Albumin: 3.5 g/dL — ABNORMAL LOW (ref 3.6–5.1)
Alkaline phosphatase (APISO): 73 U/L (ref 33–115)
BILIRUBIN TOTAL: 0.3 mg/dL (ref 0.2–1.2)
BUN: 10 mg/dL (ref 7–25)
CO2: 28 mmol/L (ref 20–32)
CREATININE: 1.03 mg/dL (ref 0.50–1.10)
Calcium: 9 mg/dL (ref 8.6–10.2)
Chloride: 102 mmol/L (ref 98–110)
GLUCOSE: 99 mg/dL (ref 65–99)
Globulin: 3.6 g/dL (calc) (ref 1.9–3.7)
Potassium: 4.3 mmol/L (ref 3.5–5.3)
Sodium: 139 mmol/L (ref 135–146)
Total Protein: 7.1 g/dL (ref 6.1–8.1)

## 2018-03-12 LAB — CBC WITH DIFFERENTIAL/PLATELET
Absolute Monocytes: 690 cells/uL (ref 200–950)
BASOS ABS: 37 {cells}/uL (ref 0–200)
Basophils Relative: 0.4 %
Eosinophils Absolute: 92 cells/uL (ref 15–500)
Eosinophils Relative: 1 %
HCT: 38.1 % (ref 35.0–45.0)
Hemoglobin: 12.3 g/dL (ref 11.7–15.5)
Lymphs Abs: 2282 cells/uL (ref 850–3900)
MCH: 24.7 pg — ABNORMAL LOW (ref 27.0–33.0)
MCHC: 32.3 g/dL (ref 32.0–36.0)
MCV: 76.7 fL — ABNORMAL LOW (ref 80.0–100.0)
MONOS PCT: 7.5 %
MPV: 9.3 fL (ref 7.5–12.5)
NEUTROS PCT: 66.3 %
Neutro Abs: 6100 cells/uL (ref 1500–7800)
PLATELETS: 426 10*3/uL — AB (ref 140–400)
RBC: 4.97 10*6/uL (ref 3.80–5.10)
RDW: 12.9 % (ref 11.0–15.0)
TOTAL LYMPHOCYTE: 24.8 %
WBC: 9.2 10*3/uL (ref 3.8–10.8)

## 2018-03-12 LAB — LIPID PANEL
Cholesterol: 155 mg/dL (ref <200)
HDL: 44 mg/dL — ABNORMAL LOW (ref >50)
LDL CHOLESTEROL (CALC): 90 mg/dL
Non-HDL Cholesterol (Calc): 111 mg/dL (calc) (ref <130)
TRIGLYCERIDES: 111 mg/dL (ref <150)
Total CHOL/HDL Ratio: 3.5 (calc) (ref <5.0)

## 2018-03-12 LAB — TSH: TSH: 1.61 mIU/L

## 2018-03-15 ENCOUNTER — Other Ambulatory Visit: Payer: Self-pay | Admitting: Orthopedic Surgery

## 2018-03-28 ENCOUNTER — Encounter (HOSPITAL_BASED_OUTPATIENT_CLINIC_OR_DEPARTMENT_OTHER): Payer: Self-pay | Admitting: *Deleted

## 2018-03-28 ENCOUNTER — Other Ambulatory Visit: Payer: Self-pay

## 2018-04-15 ENCOUNTER — Ambulatory Visit (HOSPITAL_BASED_OUTPATIENT_CLINIC_OR_DEPARTMENT_OTHER): Payer: BC Managed Care – PPO | Admitting: Anesthesiology

## 2018-04-15 ENCOUNTER — Encounter (HOSPITAL_BASED_OUTPATIENT_CLINIC_OR_DEPARTMENT_OTHER)
Admission: RE | Disposition: A | Discharge status: Home / Self Care | Payer: Self-pay | Source: Home / Self Care | Attending: Orthopedic Surgery

## 2018-04-15 ENCOUNTER — Ambulatory Visit (HOSPITAL_BASED_OUTPATIENT_CLINIC_OR_DEPARTMENT_OTHER)
Admission: RE | Admit: 2018-04-15 | Discharge: 2018-04-15 | Disposition: A | Discharge status: Home / Self Care | Payer: BC Managed Care – PPO | Attending: Orthopedic Surgery | Admitting: Orthopedic Surgery

## 2018-04-15 ENCOUNTER — Other Ambulatory Visit: Payer: Self-pay

## 2018-04-15 ENCOUNTER — Encounter (HOSPITAL_BASED_OUTPATIENT_CLINIC_OR_DEPARTMENT_OTHER): Payer: Self-pay

## 2018-04-15 DIAGNOSIS — K589 Irritable bowel syndrome without diarrhea: Secondary | ICD-10-CM | POA: Diagnosis not present

## 2018-04-15 DIAGNOSIS — Z881 Allergy status to other antibiotic agents status: Secondary | ICD-10-CM | POA: Diagnosis not present

## 2018-04-15 DIAGNOSIS — M67432 Ganglion, left wrist: Secondary | ICD-10-CM | POA: Insufficient documentation

## 2018-04-15 DIAGNOSIS — Z79899 Other long term (current) drug therapy: Secondary | ICD-10-CM | POA: Insufficient documentation

## 2018-04-15 HISTORY — PX: GANGLION CYST EXCISION: SHX1691

## 2018-04-15 HISTORY — DX: Endometriosis, unspecified: N80.9

## 2018-04-15 LAB — POCT PREGNANCY, URINE: Preg Test, Ur: NEGATIVE

## 2018-04-15 SURGERY — EXCISION, GANGLION CYST, WRIST
Anesthesia: General | Site: Wrist | Laterality: Left

## 2018-04-15 MED ORDER — ONDANSETRON HCL 4 MG/2ML IJ SOLN
INTRAMUSCULAR | Status: DC | PRN
  Administered 2018-04-15: 4 mg via INTRAVENOUS

## 2018-04-15 MED ORDER — MIDAZOLAM HCL 2 MG/2ML IJ SOLN
1.0000 mg | INTRAMUSCULAR | Status: DC | PRN
  Administered 2018-04-15: 2 mg via INTRAVENOUS

## 2018-04-15 MED ORDER — SCOPOLAMINE 1 MG/3DAYS TD PT72: 1.0000 | MEDICATED_PATCH | Freq: Once | TRANSDERMAL | Status: DC | PRN

## 2018-04-15 MED ORDER — HYDROCODONE-ACETAMINOPHEN 7.5-325 MG PO TABS
1.0000 | ORAL_TABLET | Freq: Once | ORAL | Status: AC | PRN
  Administered 2018-04-15: 1 via ORAL

## 2018-04-15 MED ORDER — DEXAMETHASONE SODIUM PHOSPHATE 10 MG/ML IJ SOLN
INTRAMUSCULAR | Status: DC | PRN
  Administered 2018-04-15: 10 mg via INTRAVENOUS

## 2018-04-15 MED ORDER — LIDOCAINE HCL (CARDIAC) PF 100 MG/5ML IV SOSY
PREFILLED_SYRINGE | INTRAVENOUS | Status: DC | PRN
  Administered 2018-04-15: 60 mg via INTRAVENOUS

## 2018-04-15 MED ORDER — ONDANSETRON HCL 4 MG/2ML IJ SOLN: 4.0000 mg | Freq: Once | INTRAMUSCULAR | Status: DC | PRN

## 2018-04-15 MED ORDER — MIDAZOLAM HCL 2 MG/2ML IJ SOLN
INTRAMUSCULAR | Status: AC
  Filled 2018-04-15: qty 2

## 2018-04-15 MED ORDER — HYDROCODONE-ACETAMINOPHEN 5-325 MG PO TABS
ORAL_TABLET | ORAL | Status: AC
  Filled 2018-04-15: qty 1

## 2018-04-15 MED ORDER — FENTANYL CITRATE (PF) 100 MCG/2ML IJ SOLN
INTRAMUSCULAR | Status: AC
  Filled 2018-04-15: qty 2

## 2018-04-15 MED ORDER — KETOROLAC TROMETHAMINE 30 MG/ML IJ SOLN
INTRAMUSCULAR | Status: AC
  Filled 2018-04-15: qty 1

## 2018-04-15 MED ORDER — PROPOFOL 10 MG/ML IV BOLUS
INTRAVENOUS | Status: DC | PRN
  Administered 2018-04-15: 200 mg via INTRAVENOUS

## 2018-04-15 MED ORDER — KETOROLAC TROMETHAMINE 30 MG/ML IJ SOLN
30.0000 mg | Freq: Once | INTRAMUSCULAR | Status: AC | PRN
  Administered 2018-04-15: 30 mg via INTRAVENOUS

## 2018-04-15 MED ORDER — HYDROCODONE-ACETAMINOPHEN 5-325 MG PO TABS: ORAL_TABLET | ORAL | 0 refills | Status: DC

## 2018-04-15 MED ORDER — DEXAMETHASONE SODIUM PHOSPHATE 10 MG/ML IJ SOLN
INTRAMUSCULAR | Status: AC
  Filled 2018-04-15: qty 1

## 2018-04-15 MED ORDER — ONDANSETRON HCL 4 MG/2ML IJ SOLN
INTRAMUSCULAR | Status: AC
  Filled 2018-04-15: qty 2

## 2018-04-15 MED ORDER — 0.9 % SODIUM CHLORIDE (POUR BTL) OPTIME
TOPICAL | Status: DC | PRN
  Administered 2018-04-15: 300 mL

## 2018-04-15 MED ORDER — PROPOFOL 10 MG/ML IV BOLUS
INTRAVENOUS | Status: AC
  Filled 2018-04-15: qty 20

## 2018-04-15 MED ORDER — VANCOMYCIN HCL IN DEXTROSE 1-5 GM/200ML-% IV SOLN
INTRAVENOUS | Status: AC
  Filled 2018-04-15: qty 200

## 2018-04-15 MED ORDER — CHLORHEXIDINE GLUCONATE 4 % EX LIQD: 60.0000 mL | Freq: Once | CUTANEOUS | Status: DC

## 2018-04-15 MED ORDER — FENTANYL CITRATE (PF) 100 MCG/2ML IJ SOLN
50.0000 ug | INTRAMUSCULAR | Status: AC | PRN
  Administered 2018-04-15: 50 ug via INTRAVENOUS
  Administered 2018-04-15: 100 ug via INTRAVENOUS
  Administered 2018-04-15: 50 ug via INTRAVENOUS

## 2018-04-15 MED ORDER — LACTATED RINGERS IV SOLN
INTRAVENOUS | Status: DC
  Administered 2018-04-15: 13:00:00 via INTRAVENOUS

## 2018-04-15 MED ORDER — BUPIVACAINE HCL (PF) 0.25 % IJ SOLN
INTRAMUSCULAR | Status: DC | PRN
  Administered 2018-04-15: 5 mL

## 2018-04-15 MED ORDER — LIDOCAINE 2% (20 MG/ML) 5 ML SYRINGE
INTRAMUSCULAR | Status: AC
  Filled 2018-04-15: qty 5

## 2018-04-15 MED ORDER — VANCOMYCIN HCL IN DEXTROSE 1-5 GM/200ML-% IV SOLN
1000.0000 mg | INTRAVENOUS | Status: AC
  Administered 2018-04-15: 1000 mg via INTRAVENOUS

## 2018-04-15 MED ORDER — FENTANYL CITRATE (PF) 100 MCG/2ML IJ SOLN
25.0000 ug | INTRAMUSCULAR | Status: DC | PRN
  Administered 2018-04-15: 25 ug via INTRAVENOUS
  Administered 2018-04-15: 50 ug via INTRAVENOUS
  Administered 2018-04-15: 25 ug via INTRAVENOUS

## 2018-04-15 SURGICAL SUPPLY — 51 items
BANDAGE ACE 3X5.8 VEL STRL LF (GAUZE/BANDAGES/DRESSINGS) ×2 IMPLANT
BENZOIN TINCTURE PRP APPL 2/3 (GAUZE/BANDAGES/DRESSINGS) IMPLANT
BLADE MINI RND TIP GREEN BEAV (BLADE) IMPLANT
BLADE SURG 15 STRL LF DISP TIS (BLADE) ×2 IMPLANT
BLADE SURG 15 STRL SS (BLADE) ×2
BNDG ELASTIC 2X5.8 VLCR STR LF (GAUZE/BANDAGES/DRESSINGS) IMPLANT
BNDG ESMARK 4X9 LF (GAUZE/BANDAGES/DRESSINGS) ×1 IMPLANT
BNDG GAUZE ELAST 4 BULKY (GAUZE/BANDAGES/DRESSINGS) ×2 IMPLANT
CHLORAPREP W/TINT 26ML (MISCELLANEOUS) ×2 IMPLANT
CORD BIPOLAR FORCEPS 12FT (ELECTRODE) ×2 IMPLANT
COVER BACK TABLE 60X90IN (DRAPES) ×2 IMPLANT
COVER MAYO STAND STRL (DRAPES) ×2 IMPLANT
COVER WAND RF STERILE (DRAPES) IMPLANT
CUFF TOURNIQUET SINGLE 18IN (TOURNIQUET CUFF) ×2 IMPLANT
DRAPE EXTREMITY T 121X128X90 (DISPOSABLE) ×2 IMPLANT
DRAPE SURG 17X23 STRL (DRAPES) ×2 IMPLANT
DRSG PAD ABDOMINAL 8X10 ST (GAUZE/BANDAGES/DRESSINGS) IMPLANT
GAUZE SPONGE 4X4 12PLY STRL (GAUZE/BANDAGES/DRESSINGS) ×2 IMPLANT
GAUZE XEROFORM 1X8 LF (GAUZE/BANDAGES/DRESSINGS) ×2 IMPLANT
GLOVE BIO SURGEON STRL SZ7 (GLOVE) ×1 IMPLANT
GLOVE BIO SURGEON STRL SZ7.5 (GLOVE) ×2 IMPLANT
GLOVE BIOGEL PI IND STRL 7.0 (GLOVE) IMPLANT
GLOVE BIOGEL PI IND STRL 7.5 (GLOVE) IMPLANT
GLOVE BIOGEL PI IND STRL 8 (GLOVE) ×1 IMPLANT
GLOVE BIOGEL PI INDICATOR 7.0 (GLOVE) ×1
GLOVE BIOGEL PI INDICATOR 7.5 (GLOVE) ×1
GLOVE BIOGEL PI INDICATOR 8 (GLOVE) ×1
GOWN STRL REUS W/ TWL LRG LVL3 (GOWN DISPOSABLE) ×1 IMPLANT
GOWN STRL REUS W/ TWL XL LVL3 (GOWN DISPOSABLE) IMPLANT
GOWN STRL REUS W/TWL LRG LVL3 (GOWN DISPOSABLE)
GOWN STRL REUS W/TWL XL LVL3 (GOWN DISPOSABLE) ×4 IMPLANT
NDL HYPO 25X1 1.5 SAFETY (NEEDLE) IMPLANT
NEEDLE HYPO 25X1 1.5 SAFETY (NEEDLE) ×2 IMPLANT
NS IRRIG 1000ML POUR BTL (IV SOLUTION) ×2 IMPLANT
PACK BASIN DAY SURGERY FS (CUSTOM PROCEDURE TRAY) ×2 IMPLANT
PAD CAST 3X4 CTTN HI CHSV (CAST SUPPLIES) IMPLANT
PADDING CAST ABS 4INX4YD NS (CAST SUPPLIES) ×1
PADDING CAST ABS COTTON 4X4 ST (CAST SUPPLIES) ×1 IMPLANT
PADDING CAST COTTON 3X4 STRL (CAST SUPPLIES) ×1
SPLINT PLASTER CAST XFAST 3X15 (CAST SUPPLIES) IMPLANT
SPLINT PLASTER XTRA FASTSET 3X (CAST SUPPLIES) ×10
STOCKINETTE 4X48 STRL (DRAPES) ×2 IMPLANT
STRIP CLOSURE SKIN 1/2X4 (GAUZE/BANDAGES/DRESSINGS) IMPLANT
SUT ETHILON 4 0 PS 2 18 (SUTURE) ×1 IMPLANT
SUT MNCRL AB 4-0 PS2 18 (SUTURE) ×1 IMPLANT
SUT MON AB 5-0 PS2 18 (SUTURE) IMPLANT
SUT VIC AB 4-0 P2 18 (SUTURE) ×1 IMPLANT
SYR BULB 3OZ (MISCELLANEOUS) ×2 IMPLANT
SYR CONTROL 10ML LL (SYRINGE) ×1 IMPLANT
TOWEL GREEN STERILE FF (TOWEL DISPOSABLE) ×4 IMPLANT
UNDERPAD 30X30 (UNDERPADS AND DIAPERS) ×2 IMPLANT

## 2018-04-15 NOTE — Anesthesia Procedure Notes (Signed)
Procedure Name: LMA Insertion Date/Time: 04/15/2018 1:36 PM Performed by: Burna Cash, CRNA Pre-anesthesia Checklist: Patient identified, Emergency Drugs available, Suction available and Patient being monitored Patient Re-evaluated:Patient Re-evaluated prior to induction Oxygen Delivery Method: Circle system utilized Preoxygenation: Pre-oxygenation with 100% oxygen Induction Type: IV induction Ventilation: Mask ventilation without difficulty LMA: LMA inserted LMA Size: 4.0 Number of attempts: 1 Airway Equipment and Method: Bite block Placement Confirmation: positive ETCO2 Tube secured with: Tape Dental Injury: Teeth and Oropharynx as per pre-operative assessment

## 2018-04-15 NOTE — H&P (Signed)
  Theresa Farmer is an 37 y.o. female.   Chief Complaint: left wrist ganglion HPI: 38 yo female with left wrist mass.  This is bothersome to her and she wishes to have it removed.  She has tried splinting without resolution.  Allergies:  Allergies  Allergen Reactions  . Keflex [Cephalexin]     hives    Past Medical History:  Diagnosis Date  . Endometriosis   . Gastritis   . H/O seasonal allergies   . IBS (irritable bowel syndrome)   . Ulcerative colitis [facility])     Past Surgical History:  Procedure Laterality Date  . TUBAL LIGATION    . WISDOM TOOTH EXTRACTION      Family History: Family History  Problem Relation Age of Onset  . Colon cancer Other        cousins and uncles  . Breast cancer Maternal Grandmother   . Diabetes Other        several maternal relatives  . Hypertension Other        several paternal and maternal relatives  . Irritable bowel syndrome Other        mother  . Immunodeficiency Father     Social History:   reports that she has never smoked. She has never used smokeless tobacco. She reports current alcohol use of about 2.0 standard drinks of alcohol per week. She reports that she does not use drugs.  Medications: Medications Prior to Admission  Medication Sig Dispense Refill  . Cholecalciferol (D-3-5) 5000 units capsule Take 2 capsules (10,000 Units total) by mouth daily.    . fluticasone (FLONASE) 50 MCG/ACT nasal spray Place 1 spray into both nostrils daily as needed for allergies. 48 g 2  . norgestimate-ethinyl estradiol (ORTHO-CYCLEN,SPRINTEC,PREVIFEM) 0.25-35 MG-MCG tablet Take 1 tablet by mouth daily.    . vitamin C (ASCORBIC ACID) 500 MG tablet Take 1 tablet (500 mg total) by mouth daily.    . cetirizine (ZYRTEC) 10 MG tablet Take 10 mg by mouth daily as needed for allergies.      No results found for this or any previous visit (from the past 48 hour(s)).  No results found.   A comprehensive review of systems was negative.  Blood  pressure 137/73, pulse 64, temperature 98.3 F (36.8 C), temperature source Oral, resp. rate 18, height 5' 5.5" (1.664 m), weight 99 kg, last menstrual period 03/28/2018, SpO2 99 %.  General appearance: alert, cooperative and appears stated age Head: Normocephalic, without obvious abnormality, atraumatic Neck: supple, symmetrical, trachea midline Cardio: regular rate and rhythm Resp: clear to auscultation bilaterally Extremities: Intact sensation and capillary refill all digits.  +epl/fpl/io.  No wounds.  Pulses: 2+ and symmetric Skin: Skin color, texture, turgor normal. No rashes or lesions Neurologic: Grossly normal Incision/Wound: none  Assessment/Plan Left wrist volar ganglion.  Non operative and operative treatment options have been discussed with the patient and patient wishes to proceed with operative treatment. Risks, benefits, and alternatives of surgery have been discussed and the patient agrees with the plan of care.   Theresa Farmer 04/15/2018, 12:19 PM

## 2018-04-15 NOTE — Discharge Instructions (Addendum)

## 2018-04-15 NOTE — Transfer of Care (Signed)
Immediate Anesthesia Transfer of Care Note  Patient: Theresa Farmer  Procedure(s) Performed: REMOVAL VOLAR GANGLION OF LEFT WRIST (Left Wrist)  Patient Location: PACU  Anesthesia Type:General  Level of Consciousness: sedated  Airway & Oxygen Therapy: Patient Spontanous Breathing and Patient connected to face mask oxygen  Post-op Assessment: Report given to RN and Post -op Vital signs reviewed and stable  Post vital signs: Reviewed and stable  Last Vitals:  Vitals Value Taken Time  BP 99/57 04/15/2018  2:20 PM  Temp    Pulse 51 04/15/2018  2:25 PM  Resp 12 04/15/2018  2:25 PM  SpO2 100 % 04/15/2018  2:25 PM  Vitals shown include unvalidated device data.  Last Pain:  Vitals:   04/15/18 1217  TempSrc: Oral  PainSc: 0-No pain         Complications: No apparent anesthesia complications

## 2018-04-15 NOTE — Anesthesia Postprocedure Evaluation (Signed)
Anesthesia Post Note  Patient: Theresa Farmer  Procedure(s) Performed: REMOVAL VOLAR GANGLION OF LEFT WRIST (Left Wrist)     Patient location during evaluation: PACU Anesthesia Type: General Level of consciousness: awake and alert Pain management: pain level controlled Vital Signs Assessment: post-procedure vital signs reviewed and stable Respiratory status: spontaneous breathing, nonlabored ventilation, respiratory function stable and patient connected to nasal cannula oxygen Cardiovascular status: blood pressure returned to baseline and stable Postop Assessment: no apparent nausea or vomiting Anesthetic complications: no    Last Vitals:  Vitals:   04/15/18 1500 04/15/18 1515  BP: 120/79 129/78  Pulse: 62 67  Resp: 16 18  Temp:    SpO2: 99% 100%    Last Pain:  Vitals:   04/15/18 1515  TempSrc:   PainSc: 3                  Charod Slawinski P Kasaundra Fahrney

## 2018-04-15 NOTE — Op Note (Signed)
NAME: Theresa Farmer MEDICAL RECORD NO: [id number] DATE OF BIRTH: 23-Apr-1980 FACILITY: [facility] LOCATION: [facility] PHYSICIAN: Tami Ribas, MD   OPERATIVE REPORT   DATE OF PROCEDURE: 04/15/18    PREOPERATIVE DIAGNOSIS:   Left wrist volar ganglion cyst   POSTOPERATIVE DIAGNOSIS:   Left wrist volar ganglion cyst   PROCEDURE:   Excision left wrist volar ganglion cyst   SURGEON:  Betha Loa, M.D.   ASSISTANT: none   ANESTHESIA:  General   INTRAVENOUS FLUIDS:  Per anesthesia flow sheet.   ESTIMATED BLOOD LOSS:  Minimal.   COMPLICATIONS:  None.   SPECIMENS:   Left wrist ganglion to pathology   TOURNIQUET TIME:    Total Tourniquet Time Documented: Upper Arm (Left) - 26 minutes Total: Upper Arm (Left) - 26 minutes    DISPOSITION:  Stable to PACU.   INDICATIONS: 38 year old female with mass on the volar aspect of the left wrist.  This is bothersome and painful to her.  She wishes to have it removed. Risks, benefits and alternatives of surgery were discussed including the risks of blood loss, infection, damage to nerves, vessels, tendons, ligaments, bone for surgery, need for additional surgery, complications with wound healing, continued pain, stiffness.  She voiced understanding of these risks and elected to proceed.  OPERATIVE COURSE:  After being identified preoperatively by myself,  the patient and I agreed on the procedure and site of the procedure.  The surgical site was marked.  Surgical consent had been signed. She was given IV Ancef as preoperative antibiotic prophylaxis. She was transferred to the operating room and placed on the operating table in supine position with the Left upper extremity on an arm board.  General anesthesia was induced by the anesthesiologist.  Left upper extremity was prepped and draped in normal sterile orthopedic fashion.  A surgical pause was performed between the surgeons, anesthesia, and operating room staff and all were in  agreement as to the patient, procedure, and site of procedure.  Tourniquet at the proximal aspect of the extremity was inflated to 250 mmHg after exsanguination of the arm with an Esmarch bandage.    He is exact incision was made over the mass at the volar radial aspect of the wrist.  This is carried into the subcutaneous tissues by spreading technique.  The superficial palmar branch of the radial artery was identified and protected throughout the case.  The cyst was noted coming up adjacent to this.  It coursed down at the radial side of the FCR tendon.  He was carefully followed.  It was coming from the volar radial aspect of the wrist joint.  A small portion of volar wrist ligament was released to allow access to the source of the cyst.  The cyst was removed and sent to pathology for examination.  It had been coursing along the radial side of the FCR tendon as well.  The rent in the capsule was repaired.  This was done using 4-0 Vicryl suture.  The Vicryl suture was also used to reapproximate the released portion of the volar ligament and deep portion of the FCR tendon sheath.  This provided good re-apposition of soft tissues.  The wound was copiously irrigated with sterile saline.  It was closed with 4-0 nylon in a horizontal mattress fashion.  The wound was injected with quarter percent plain Marcaine to aid in postoperative analgesia.  It was dressed with sterile Xeroform 4 x 4's and wrapped with a Kerlix bandage.  A  volar splint was placed and wrapped with Kerlix and Ace bandage.  The tourniquet was deflated at 26 minutes.  Fingertips were pink with brisk capillary refill after deflation of tourniquet.  The operative  drapes were broken down.  The patient was awoken from anesthesia safely.  She was transferred back to the stretcher and taken to PACU in stable condition.  I will see her back in the office in 1 week for postoperative followup.  I will give her a prescription for Norco 5/325 1-2 tabs PO q6 hours  prn pain, dispense # 20.   Betha Loa, MD Electronically signed, 04/15/18

## 2018-04-15 NOTE — Anesthesia Preprocedure Evaluation (Signed)
Anesthesia Evaluation  Patient identified by MRN, date of birth, ID band Patient awake    Reviewed: Allergy & Precautions, NPO status , Patient's Chart, lab work & pertinent test results  Airway Mallampati: II  TM Distance: >3 FB     Dental   Pulmonary neg pulmonary ROS,    breath sounds clear to auscultation       Cardiovascular negative cardio ROS   Rhythm:Regular Rate:Normal     Neuro/Psych negative neurological ROS  negative psych ROS   GI/Hepatic Neg liver ROS, PUD (Gastritis and UC),   Endo/Other  negative endocrine ROS  Renal/GU negative Renal ROS  negative genitourinary   Musculoskeletal negative musculoskeletal ROS (+)   Abdominal   Peds negative pediatric ROS (+)  Hematology negative hematology ROS (+)   Anesthesia Other Findings   Reproductive/Obstetrics negative OB ROS                             Anesthesia Physical Anesthesia Plan  ASA: II  Anesthesia Plan: General   Post-op Pain Management:    Induction: Intravenous  PONV Risk Score and Plan: 3 and Midazolam, Dexamethasone, Ondansetron and Treatment may vary due to age or medical condition  Airway Management Planned: LMA  Additional Equipment:   Intra-op Plan:   Post-operative Plan: Extubation in OR  Informed Consent: I have reviewed the patients History and Physical, chart, labs and discussed the procedure including the risks, benefits and alternatives for the proposed anesthesia with the patient or authorized representative who has indicated his/her understanding and acceptance.     Dental advisory given  Plan Discussed with: CRNA  Anesthesia Plan Comments:         Anesthesia Quick Evaluation

## 2018-04-16 ENCOUNTER — Encounter (HOSPITAL_BASED_OUTPATIENT_CLINIC_OR_DEPARTMENT_OTHER): Payer: Self-pay | Admitting: Orthopedic Surgery

## 2019-02-27 ENCOUNTER — Ambulatory Visit: Payer: PRIVATE HEALTH INSURANCE | Attending: Internal Medicine

## 2019-02-27 DIAGNOSIS — Z20822 Contact with and (suspected) exposure to covid-19: Secondary | ICD-10-CM

## 2019-03-02 LAB — NOVEL CORONAVIRUS, NAA: SARS-CoV-2, NAA: DETECTED — AB

## 2019-03-03 ENCOUNTER — Telehealth: Payer: Self-pay | Admitting: Critical Care Medicine

## 2019-03-03 NOTE — Telephone Encounter (Signed)
Pt COVID positive Recommend she quarentine for the full 2 weeks  If she has any difficulty breathing then go to ER

## 2019-03-03 NOTE — Telephone Encounter (Signed)
Call placed to patient. LMTRC.  

## 2019-03-03 NOTE — Telephone Encounter (Signed)
I connected this patient who is Covid positive but her symptoms began on January 2.  She is too far out from her symptoms to qualify for monoclonal antibody and she is rapidly better.  Her Covid test was on January 8 and was positive

## 2019-03-04 NOTE — Telephone Encounter (Signed)
Call placed to patient for daily report.   Patient Reported Symptoms Y/N  Cough Y  SOB N  Fever N  Taking IBU/APAP N  Chills Y  Muscle pain N  Headache N  Sore throat N  Loss of taste/ smell N   Patient reports that she feels that she is improving, but she continues to have cough and chills.   Advised to continue to quarantine until 03/14/2019. Advised if SOB, DOE present, go to ER for eval.   Verbalized understanding.

## 2019-03-04 NOTE — Telephone Encounter (Signed)
noted 

## 2019-06-09 ENCOUNTER — Other Ambulatory Visit: Payer: Self-pay

## 2019-06-09 ENCOUNTER — Ambulatory Visit (INDEPENDENT_AMBULATORY_CARE_PROVIDER_SITE_OTHER): Payer: PRIVATE HEALTH INSURANCE | Admitting: Nurse Practitioner

## 2019-06-09 VITALS — BP 122/70 | HR 71 | Temp 98.0°F | Resp 18 | Wt 218.6 lb

## 2019-06-09 DIAGNOSIS — Z13228 Encounter for screening for other metabolic disorders: Secondary | ICD-10-CM | POA: Diagnosis not present

## 2019-06-09 DIAGNOSIS — M7989 Other specified soft tissue disorders: Secondary | ICD-10-CM | POA: Diagnosis not present

## 2019-06-09 DIAGNOSIS — Z13220 Encounter for screening for lipoid disorders: Secondary | ICD-10-CM

## 2019-06-09 NOTE — Progress Notes (Signed)
Established Patient Office Visit  Subjective:  Patient ID: Theresa Farmer, female    DOB: March 17, 1980  Age: 39 y.o. MRN: [id number]  CC:  Chief Complaint  Patient presents with  . Leg Pain    tightness from knee down on both legs, started x4 days    HPI Theresa Farmer is a 39 year old female presenting for sxs of lower portion of BLE feeling slightly heavier than usual. Her sxs started gradually about 2 weeks ago. She has noticed mild edema. No hot red spot, numbness or tingling, weakness, pain, itching, discharge, change in temperature cp/ct, gu/gi sxs, sob. She reports she has J/J COVID vaccine 3 weeks ago and was concerned that her sxs may be from the vaccine. She reports that she has started working from home over the past 10-12 months and admits to sitting at desk 8 hour shifts. She has developed mild neck and back pain she related to this. She does not take her blood pressure at home. Discussed exercise and importance of standing, stretching, and moving during her work shift.  Pt reports that the sxs to ble resolves at night when she lies down and completely resolved upon rising. The sxs redevelops half way through work shift.    Past Medical History:  Diagnosis Date  . Endometriosis   . Gastritis   . H/O seasonal allergies   . IBS (irritable bowel syndrome)   . Ulcerative colitis [facility])     Past Surgical History:  Procedure Laterality Date  . GANGLION CYST EXCISION Left 04/15/2018   Procedure: REMOVAL VOLAR GANGLION OF LEFT WRIST;  Surgeon: Leanora Cover, MD;  Location: [facility];  Service: Orthopedics;  Laterality: Left;  . TUBAL LIGATION    . WISDOM TOOTH EXTRACTION      Family History  Problem Relation Age of Onset  . Colon cancer Other        cousins and uncles  . Breast cancer Maternal Grandmother   . Diabetes Other        several maternal relatives  . Hypertension Other        several paternal and maternal relatives  . Irritable bowel syndrome Other          mother  . Immunodeficiency Father     Social History   Socioeconomic History  . Marital status: Married    Spouse name: Not on file  . Number of children: 4  . Years of education: Not on file  . Highest education level: Not on file  Occupational History  . Occupation: Pharmacist, hospital  Tobacco Use  . Smoking status: Never Smoker  . Smokeless tobacco: Never Used  Substance and Sexual Activity  . Alcohol use: Yes    Alcohol/week: 2.0 standard drinks    Types: 2 Standard drinks or equivalent per week    Comment: occasionally  . Drug use: No  . Sexual activity: Yes  Other Topics Concern  . Not on file  Social History Narrative  . Not on file   Social Determinants of Health   Financial Resource Strain:   . Difficulty of Paying Living Expenses:   Food Insecurity:   . Worried About Charity fundraiser in the Last Year:   . Arboriculturist in the Last Year:   Transportation Needs:   . Film/video editor (Medical):   Marland Kitchen Lack of Transportation (Non-Medical):   Physical Activity:   . Days of Exercise per Week:   . Minutes of Exercise per Session:  Stress:   . Feeling of Stress :   Social Connections:   . Frequency of Communication with Friends and Family:   . Frequency of Social Gatherings with Friends and Family:   . Attends Religious Services:   . Active Member of Clubs or Organizations:   . Attends Banker Meetings:   Marland Kitchen Marital Status:   Intimate Partner Violence:   . Fear of Current or Ex-Partner:   . Emotionally Abused:   Marland Kitchen Physically Abused:   . Sexually Abused:     Outpatient Medications Prior to Visit  Medication Sig Dispense Refill  . cetirizine (ZYRTEC) 10 MG tablet Take 10 mg by mouth daily as needed for allergies.    . Cholecalciferol (D-3-5) 5000 units capsule Take 2 capsules (10,000 Units total) by mouth daily.    . fluticasone (FLONASE) 50 MCG/ACT nasal spray Place 1 spray into both nostrils daily as needed for allergies. 48 g 2  .  norgestimate-ethinyl estradiol (ORTHO-CYCLEN,SPRINTEC,PREVIFEM) 0.25-35 MG-MCG tablet Take 1 tablet by mouth daily.    . vitamin C (ASCORBIC ACID) 500 MG tablet Take 1 tablet (500 mg total) by mouth daily.    Marland Kitchen HYDROcodone-acetaminophen (NORCO) 5-325 MG tablet 1-2 tabs po q6 hours prn pain 20 tablet 0   No facility-administered medications prior to visit.    Allergies  Allergen Reactions  . Keflex [Cephalexin]     hives    ROS Review of Systems  All other systems reviewed and are negative.     Objective:    Physical Exam  Constitutional: She is oriented to person, place, and time. Vital signs are normal. She appears well-developed and well-nourished.  HENT:  Head: Normocephalic.  Right Ear: Hearing and ear canal normal.  Left Ear: Hearing and ear canal normal.  Eyes: Pupils are equal, round, and reactive to light. Conjunctivae, EOM and lids are normal. Lids are everted and swept, no foreign bodies found.  Neck: No JVD present.  Cardiovascular: Normal rate and regular rhythm.  Pulmonary/Chest: Effort normal and breath sounds normal.  Abdominal: Soft.  Musculoskeletal:        General: No tenderness, deformity or edema. Normal range of motion.     Cervical back: Normal range of motion and neck supple.     Right ankle: No swelling, deformity, ecchymosis or lacerations. Normal range of motion. Normal pulse.     Right Achilles Tendon: No pain.     Left ankle: No swelling, deformity, ecchymosis or lacerations. No tenderness. Normal range of motion. Normal pulse.     Left Achilles Tendon: No pain or defects.  Lymphadenopathy:    She has no cervical adenopathy.  Neurological: She is alert and oriented to person, place, and time. She has normal strength and normal reflexes. She displays a negative Romberg sign.  Skin: Skin is warm and dry.  Psychiatric: She has a normal mood and affect. Her speech is normal and behavior is normal. Judgment and thought content normal. Cognition and  memory are normal.  Nursing note and vitals reviewed.   BP 122/70 (BP Location: Left Arm, Patient Position: Sitting, Cuff Size: Large)   Pulse 71   Temp 98 F (36.7 C) (Temporal)   Resp 18   Wt 218 lb 9.6 oz (99.2 kg)   LMP 05/29/2019   SpO2 99%   BMI 35.82 kg/m  Wt Readings from Last 3 Encounters:  06/09/19 218 lb 9.6 oz (99.2 kg)  04/15/18 218 lb 4.1 oz (99 kg)  03/11/18 216 lb (98 kg)  Health Maintenance Due  Topic Date Due  . HIV Screening  Never done  . COVID-19 Vaccine (1) Never done  . TETANUS/TDAP  Never done  . PAP SMEAR-Modifier  07/14/2018    There are no preventive care reminders to display for this patient.  Lab Results  Component Value Date   TSH 1.61 03/11/2018   Lab Results  Component Value Date   WBC 9.2 03/11/2018   HGB 12.3 03/11/2018   HCT 38.1 03/11/2018   MCV 76.7 (L) 03/11/2018   PLT 426 (H) 03/11/2018   Lab Results  Component Value Date   NA 139 03/11/2018   K 4.3 03/11/2018   CO2 28 03/11/2018   GLUCOSE 99 03/11/2018   BUN 10 03/11/2018   CREATININE 1.03 03/11/2018   BILITOT 0.3 03/11/2018   ALKPHOS 62 10/06/2016   AST 9 (L) 03/11/2018   ALT 8 03/11/2018   PROT 7.1 03/11/2018   ALBUMIN 3.8 10/06/2016   CALCIUM 9.0 03/11/2018   ANIONGAP 10 11/06/2017   GFR 78.36 10/06/2016   Lab Results  Component Value Date   CHOL 155 03/11/2018   Lab Results  Component Value Date   HDL 44 (L) 03/11/2018   Lab Results  Component Value Date   LDLCALC 90 03/11/2018   Lab Results  Component Value Date   TRIG 111 03/11/2018   Lab Results  Component Value Date   CHOLHDL 3.5 03/11/2018   No results found for: HGBA1C    Assessment & Plan:   Problem List Items Addressed This Visit    None    Visit Diagnoses    Swelling of lower extremity    -  Primary   Screening cholesterol level       Relevant Orders   Lipid panel   Screening for metabolic disorder       Relevant Orders   CBC with Differential/Platelet   COMPLETE  METABOLIC PANEL WITH GFR    Your symptoms are consistent with dependent edema most likely secondary to sitting at desk working for long periods.  Increase walking and standing time breaks during work hours. May use compression stockings bought over the counter at local pharmacy.  Please monitor Blood Pressures at home reporting in one week >140/90 and make appt if indicated.     Follow-up: Return if symptoms worsen or fail to improve.    Elmore Guise, FNP

## 2019-06-10 ENCOUNTER — Encounter: Payer: Self-pay | Admitting: Nurse Practitioner

## 2019-06-10 NOTE — Patient Instructions (Signed)
Your symptoms are consistent with dependent edema most likely secondary to sitting at desk working for long periods.  Increase walking and standing time breaks during work hours. May use compression stockings bought over the counter at local pharmacy.  Please monitor Blood Pressures at home reporting in one week >140/90 and make appt if indicated.

## 2019-07-11 IMAGING — CR DG WRIST COMPLETE 3+V*L*
4 series · 4 of 4 positions shown · non-contrast
Comparison: None.

CLINICAL DATA: Chronic left wrist pain. Palpable knot on dorsal
aspect.

EXAM:
LEFT WRIST - COMPLETE 3+ VIEW

[x wrist pa left *]
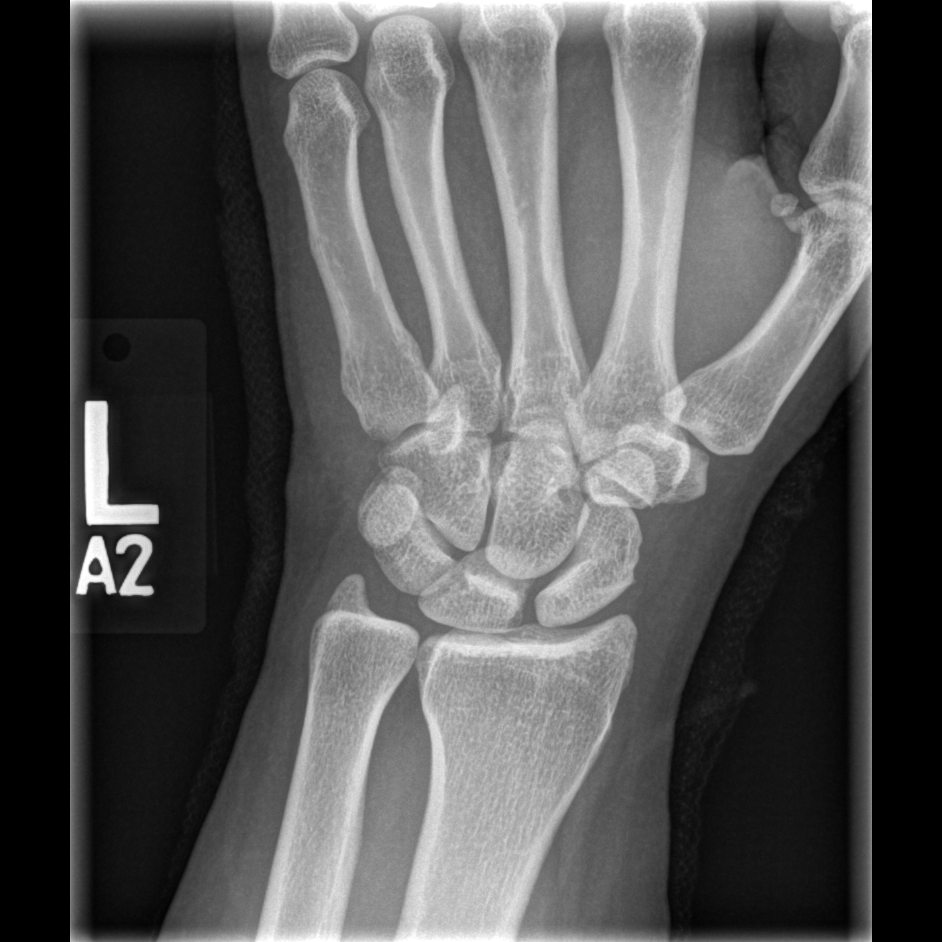

[x wrist obl left]
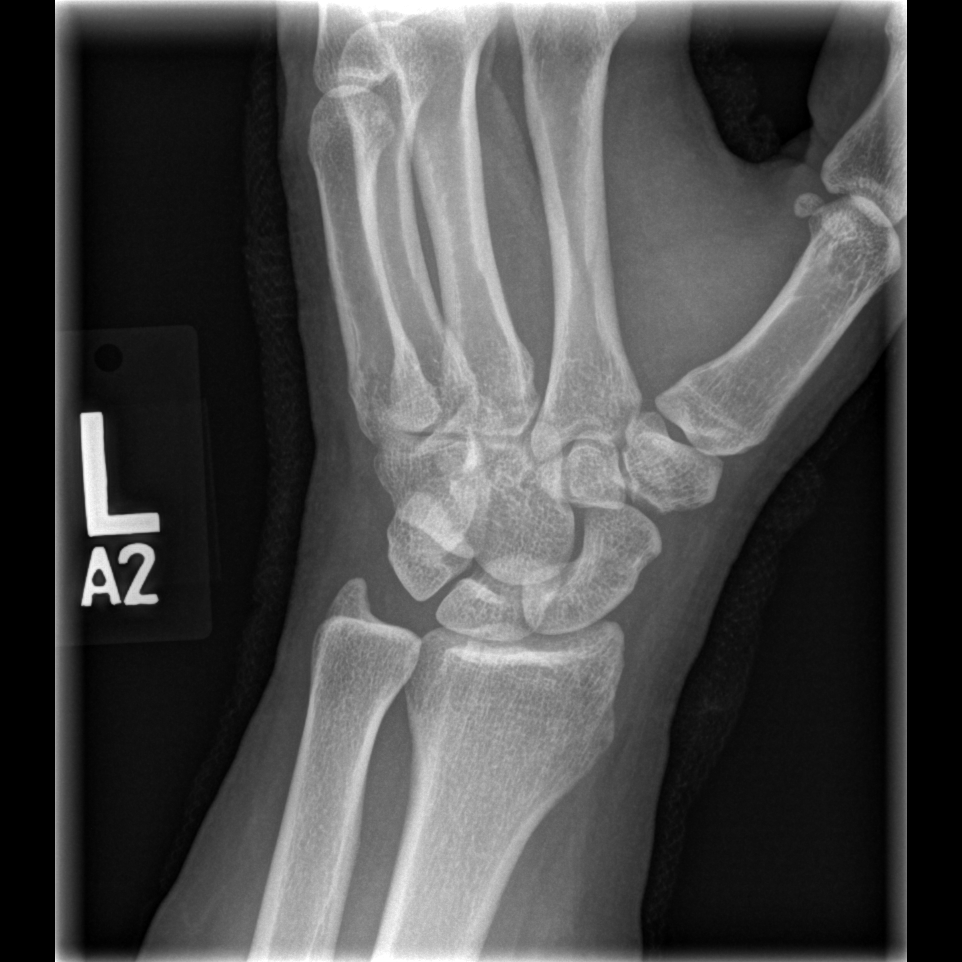

[x wrist lat left]
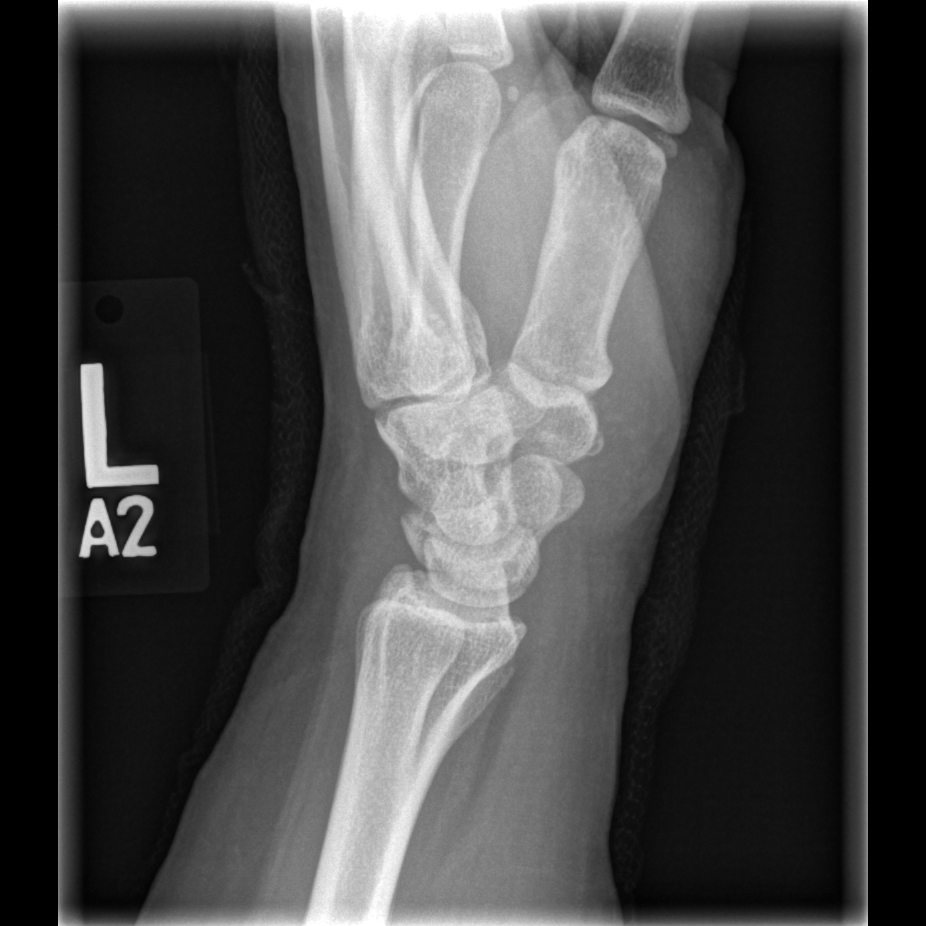

[x navicular]
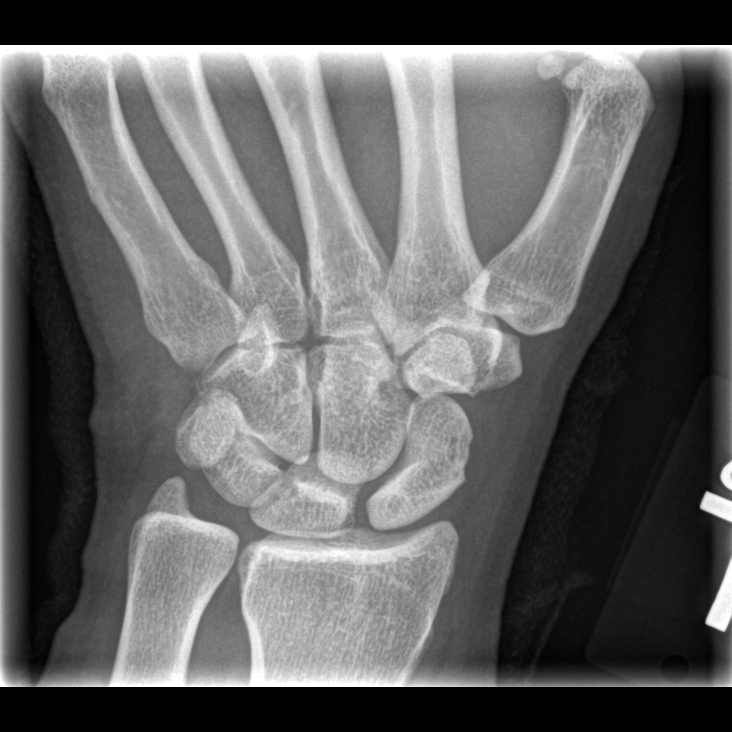

[4 of 4 positions shown; findings below may reference images not displayed]

FINDINGS: There is no evidence of fracture or dislocation. There is no
evidence of arthropathy or other focal bone abnormality. Soft
tissues are unremarkable.
IMPRESSION: Negative.

## 2019-07-28 ENCOUNTER — Ambulatory Visit (INDEPENDENT_AMBULATORY_CARE_PROVIDER_SITE_OTHER): Payer: PRIVATE HEALTH INSURANCE | Admitting: Family Medicine

## 2019-07-28 ENCOUNTER — Other Ambulatory Visit: Payer: Self-pay

## 2019-07-28 ENCOUNTER — Encounter: Payer: Self-pay | Admitting: Family Medicine

## 2019-07-28 VITALS — BP 120/62 | HR 86 | Temp 98.1°F | Resp 14 | Ht 66.0 in | Wt 218.0 lb

## 2019-07-28 DIAGNOSIS — Z13220 Encounter for screening for lipoid disorders: Secondary | ICD-10-CM

## 2019-07-28 DIAGNOSIS — Z0001 Encounter for general adult medical examination with abnormal findings: Secondary | ICD-10-CM

## 2019-07-28 DIAGNOSIS — Z0000 Encounter for general adult medical examination without abnormal findings: Secondary | ICD-10-CM

## 2019-07-28 DIAGNOSIS — Z23 Encounter for immunization: Secondary | ICD-10-CM | POA: Diagnosis not present

## 2019-07-28 DIAGNOSIS — Z6835 Body mass index (BMI) 35.0-35.9, adult: Secondary | ICD-10-CM

## 2019-07-28 DIAGNOSIS — E559 Vitamin D deficiency, unspecified: Secondary | ICD-10-CM

## 2019-07-28 DIAGNOSIS — K529 Noninfective gastroenteritis and colitis, unspecified: Secondary | ICD-10-CM | POA: Diagnosis not present

## 2019-07-28 DIAGNOSIS — E66812 Obesity, class 2: Secondary | ICD-10-CM

## 2019-07-28 MED ORDER — PHENTERMINE HCL 37.5 MG PO TABS: 37.5000 mg | ORAL_TABLET | Freq: Every day | ORAL | 2 refills | Status: DC

## 2019-07-28 NOTE — Assessment & Plan Note (Signed)
Controlling with diet

## 2019-07-28 NOTE — Progress Notes (Signed)
Subjective:    Patient ID: Theresa Farmer, female    DOB: [birth year], 39 y.o.   MRN: [id number]  Patient presents for Annual Exam (is not fasting)  Patient here for complete physical exam.  Medications reviewed She still followed by gastroenterology for her inflammatory bowel disease.  She has not been on any medication secondary to side effects for the past year and a half. She had an interim visit in April secondary to some dependent edema that has resolved  Medications and history reviewed She is due for fasting labs  Due for Pap smear by GYN -had heavy menstrual cycles for the first couple days she has been taking birth control.  When she was on a very extremely low carb weight loss diet  Optavia she had menstrual cycle that lasted for 2 months.  She also is concerned about possible fibroids due to her cramping and pain which makes her bowel movements difficult.   Immunizations   COVID-19- UTD,  J&J covid vaccine -  05/29/19   Lot #[id number] ,   TDAP Due    Obesity -patient was following the Octavia diet and lost about 15 to 20 pounds.  However she has been unable to sustain the low-carb diet/ does not tolerate the soy formula  and the cost of the meal replacements over $500 a month with too much.  Now finds herself craving sweets a lot. States she is  falling back into her bad habits.  She has been trying to increase her water and she is trying to stick with small meals a day.  Mostly poultry and veggies.  She feels she cannot eat beef because it causes her inflammatory bowel to act up. She would like to get to 200lbs     [facility]    Review Of Systems:  GEN- denies fatigue, fever, weight loss,weakness, recent illness HEENT- denies eye drainage, change in vision, nasal discharge, CVS- denies chest pain, palpitations RESP- denies SOB, cough, wheeze ABD- denies N/V, change in stools, abd pain GU- denies dysuria, hematuria, dribbling, incontinence MSK- denies  joint pain, muscle aches, injury Neuro- denies headache, dizziness, syncope, seizure activity       Objective:    BP 120/62   Pulse 86   Temp 98.1 F (36.7 C) (Temporal)   Resp 14   Ht 5\' 6"  (1.676 m)   Wt 218 lb (98.9 kg)   LMP 07/21/2019 Comment: regular  SpO2 98%   BMI 35.19 kg/m  GEN- NAD, alert and oriented x3 HEENT- PERRL, EOMI, non injected sclera, pink conjunctiva, MMM, oropharynx clear, TM clear no effusion , wear glasses  Neck- Supple, no thyromegaly CVS- RRR, no murmur RESP-CTAB ABD-NABS,soft,NT,ND EXT- No edema Pulses- Radial, DP- 2+   PHQ 9 score 0   FALL/CAGE score negative        Assessment & Plan:      Problem List Items Addressed This Visit      Unprioritized   IBD (inflammatory bowel disease)    Controlling with diet       Obesity    Class 2 obesity I think the very strict low carb diet has been difficult for her to maintain She also did not tolerate the soy based protein formula very well She will stick with low carb but will try calorie counting app Recommend she not go below 1200 calories Start phentermine- discussed SE of meds, start with 1/2 tablet of  37.5mg  once a day  Relevant Medications   phentermine (ADIPEX-P) 37.5 MG tablet   Other Relevant Orders   Hemoglobin A1c    Other Visit Diagnoses    Routine general medical examination at a health care facility    -  Primary   CPE done, fasting labs, TDAP done, F/U GYN for her dysmenorrhea. continue vitamins    Relevant Orders   CBC with Differential/Platelet   Comprehensive metabolic panel   Hemoglobin A1c   Lipid panel   TSH   Tdap vaccine greater than or equal to 7yo IM (Completed)   Vitamin D deficiency       Relevant Orders   Vitamin D, 25-hydroxy   Lipid screening       Relevant Orders   Lipid panel   Need for tetanus, diphtheria, and acellular pertussis (Tdap) vaccine in patient of adolescent age or older       Relevant Orders   Tdap vaccine greater than  or equal to 7yo IM (Completed)      Note: This dictation was prepared with Dragon dictation along with smaller Lobbyist. Any transcriptional errors that result from this process are unintentional.

## 2019-07-28 NOTE — Assessment & Plan Note (Signed)
Class 2 obesity I think the very strict low carb diet has been difficult for her to maintain She also did not tolerate the soy based protein formula very well She will stick with low carb but will try calorie counting app Recommend she not go below 1200 calories Start phentermine- discussed SE of meds, start with 1/2 tablet of  37.5mg  once a day

## 2019-07-28 NOTE — Patient Instructions (Addendum)
Start phentermine 1/2 tablet once a day in the morning  We will call with lab results  Tetanus given  Myfitness PAL App, at minimum 1200 calories  F/U 1 month

## 2019-07-29 LAB — CBC WITH DIFFERENTIAL/PLATELET
Absolute Monocytes: 319 cells/uL (ref 200–950)
Basophils Absolute: 33 cells/uL (ref 0–200)
Basophils Relative: 0.3 %
Eosinophils Absolute: 44 cells/uL (ref 15–500)
Eosinophils Relative: 0.4 %
HCT: 39.6 % (ref 35.0–45.0)
Hemoglobin: 12.7 g/dL (ref 11.7–15.5)
Lymphs Abs: 1705 cells/uL (ref 850–3900)
MCH: 25.3 pg — ABNORMAL LOW (ref 27.0–33.0)
MCHC: 32.1 g/dL (ref 32.0–36.0)
MCV: 79 fL — ABNORMAL LOW (ref 80.0–100.0)
MPV: 10.1 fL (ref 7.5–12.5)
Monocytes Relative: 2.9 %
Neutro Abs: 8899 cells/uL — ABNORMAL HIGH (ref 1500–7800)
Neutrophils Relative %: 80.9 %
Platelets: 363 10*3/uL (ref 140–400)
RBC: 5.01 10*6/uL (ref 3.80–5.10)
RDW: 12.3 % (ref 11.0–15.0)
Total Lymphocyte: 15.5 %
WBC: 11 10*3/uL — ABNORMAL HIGH (ref 3.8–10.8)

## 2019-07-29 LAB — TSH: TSH: 0.62 mIU/L

## 2019-07-29 LAB — LIPID PANEL
Cholesterol: 163 mg/dL (ref <200)
HDL: 52 mg/dL (ref >=50)
LDL Cholesterol (Calc): 97 mg/dL (calc)
Non-HDL Cholesterol (Calc): 111 mg/dL (calc) (ref <130)
Total CHOL/HDL Ratio: 3.1 (calc) (ref <5.0)
Triglycerides: 46 mg/dL (ref <150)

## 2019-07-29 LAB — COMPREHENSIVE METABOLIC PANEL
AG Ratio: 1.1 (calc) (ref 1.0–2.5)
ALT: 15 U/L (ref 6–29)
AST: 13 U/L (ref 10–30)
Albumin: 3.7 g/dL (ref 3.6–5.1)
Alkaline phosphatase (APISO): 72 U/L (ref 31–125)
BUN: 14 mg/dL (ref 7–25)
CO2: 26 mmol/L (ref 20–32)
Calcium: 9.3 mg/dL (ref 8.6–10.2)
Chloride: 103 mmol/L (ref 98–110)
Creat: 1.1 mg/dL (ref 0.50–1.10)
Globulin: 3.4 g/dL (calc) (ref 1.9–3.7)
Glucose, Bld: 110 mg/dL — ABNORMAL HIGH (ref 65–99)
Potassium: 4.1 mmol/L (ref 3.5–5.3)
Sodium: 137 mmol/L (ref 135–146)
Total Bilirubin: 0.5 mg/dL (ref 0.2–1.2)
Total Protein: 7.1 g/dL (ref 6.1–8.1)

## 2019-07-29 LAB — VITAMIN D 25 HYDROXY (VIT D DEFICIENCY, FRACTURES): Vit D, 25-Hydroxy: 57 ng/mL (ref 30–100)

## 2019-07-29 LAB — HEMOGLOBIN A1C
Hgb A1c MFr Bld: 5.4 % of total Hgb (ref <5.7)
Mean Plasma Glucose: 108 (calc)
eAG (mmol/L): 6 (calc)

## 2019-08-27 ENCOUNTER — Ambulatory Visit: Payer: PRIVATE HEALTH INSURANCE | Admitting: Family Medicine

## 2019-09-16 ENCOUNTER — Telehealth: Payer: Self-pay | Admitting: *Deleted

## 2019-09-16 NOTE — Telephone Encounter (Signed)
Noted, agree push fluids ER if it worsens

## 2019-09-16 NOTE — Telephone Encounter (Signed)
Received call from patient.   Reports that she has x4 days of GI upset and loose stools.   States that she is not sure if it is IBS flare, viral GI bug from daycare, or medication side effect.   Reports that she is trying to drink plenty of fluids, but even water goes "straight through her".  Denies any abd cramps, blood in stool, nausea, fever. Advised to use BRAT diet and keep pushing fluids.   States that grandson does go to daycare and is currently living with her. Reports that it is possible he was exposed to something viral.   Reports that she has been taking Phentermine with no issues, and has been watching her diet and increased her exercise. Reports that she has noted she has lost weight. States that while having issues with stools, she has stopped phentermine.   Patient already has appointment for 09/17/2019 with MD for weight F/U. Advised to keep appointment unless Sx worsen overnight. If Sx worsen, go to ER for evaluation.

## 2019-09-17 ENCOUNTER — Encounter: Payer: Self-pay | Admitting: Family Medicine

## 2019-09-17 ENCOUNTER — Ambulatory Visit (INDEPENDENT_AMBULATORY_CARE_PROVIDER_SITE_OTHER): Payer: PRIVATE HEALTH INSURANCE | Admitting: Family Medicine

## 2019-09-17 ENCOUNTER — Other Ambulatory Visit: Payer: Self-pay

## 2019-09-17 VITALS — BP 118/78 | HR 80 | Temp 98.2°F | Resp 14 | Ht 66.0 in | Wt 208.0 lb

## 2019-09-17 DIAGNOSIS — K529 Noninfective gastroenteritis and colitis, unspecified: Secondary | ICD-10-CM

## 2019-09-17 DIAGNOSIS — R197 Diarrhea, unspecified: Secondary | ICD-10-CM

## 2019-09-17 MED ORDER — PREDNISONE 10 MG PO TABS: ORAL_TABLET | ORAL | 0 refills | Status: DC

## 2019-09-17 NOTE — Progress Notes (Signed)
Subjective:    Patient ID: Theresa Farmer, female    DOB: January 24, 1981, 39 y.o.   MRN: [id number]  Patient presents for Follow-up (phentermine- stopped taking x4 days d/t GI issues), IBS Flare (increased loose stools x4 days- needs note for job for flares), and Joint Pain  Pt here with GI upset  Her grandson was out of school for 24 hours due to exposure at Daycare for GI bug She then started getting body aches, left hip pain, mid thoracic back pain to left shoulder/arm pain where she couldn't even lift above shoulder height Her wrist was swollen as well, then a few days later  Started with  severe diarrhea- non bloody, everything was running through her wether fluids such as water pedialyts, oatmeal Yesterday went to BM 8 times Today has had 6 BM  Feels like she is in full flare of IBD Past couple of day in the evening only felt a little tight in chest, more like gas,  no cough, fever, sinus, drainage     She has been on phenterine for past 2 months, weight was down 7lbs before above symptoms  she has been trying to increase veggies, walking 3-9 miles a week     Review Of Systems:  GEN- denies fatigue, fever, weight loss,weakness, recent illness HEENT- denies eye drainage, change in vision, nasal discharge, CVS- denies chest pain, palpitations RESP- denies SOB, cough, wheeze ABD- denies N/V, +change in stools,+ abd pain GU- denies dysuria, hematuria, dribbling, incontinence MSK- denies joint pain, +muscle aches, injury Neuro- denies headache, dizziness, syncope, seizure activity       Objective:    BP 118/78   Pulse 80   Temp 98.2 F (36.8 C) (Temporal)   Resp 14   Ht 5\' 6"  (1.676 m)   Wt (!) 208 lb (94.3 kg)   SpO2 96%   BMI 33.57 kg/m  GEN- NAD, alert and oriented x3 HEENT- PERRL, EOMI, non injected sclera, pink conjunctiva, MMM, oropharynx clear Neck- Supple, no LAD CVS- RRR, no murmur RESP-CTAB ABD-NABS,soft,mild upper abd, no rebound, no guarding EXT- No  edema MSK- FROM upper, lower ext, no joint effusions Pulses- Radial, DP- 2+        Assessment & Plan:      Problem List Items Addressed This Visit      Unprioritized   IBD (inflammatory bowel disease) - Primary    Pt with diarrhea, myalgias, inflammatory response Possible GI bug/virus that started She has had covid vaccine and no fever or respiratory symptoms, so I think this is less likely Appears to be in one of her flares She has not been on MTX or Humira for the past 2 years I dont think this is related to phentermine, but also on differential is significant change in diet that may have set off symptoms Start prednisone , check labs, no acute abdomen Will recheck out to her GI for further recommendations Due to severe and multiple stools she needs to work from home. 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 Any transcriptional errors that result from this process are unintentional.

## 2019-09-17 NOTE — Telephone Encounter (Signed)
Received call from patient.   Reports that she has noted increased body aches today and some chest tightness like she is starting to get chest congestion. States that she still feels this is due to IBS flare, but wanted to inform PCP.   Denies cough, fever, chills, SOB. Reports that she doesn't feel worse necessarily, just noting more symptoms.   Advised to keep appointment as patient is hesitant to go to ER.

## 2019-09-17 NOTE — Assessment & Plan Note (Addendum)
Pt with diarrhea, myalgias, inflammatory response Possible GI bug/virus that started She has had covid vaccine and no fever or respiratory symptoms, so I think this is less likely Appears to be in one of her flares She has not been on MTX or Humira for the past 2 years I dont think this is related to phentermine, but also on differential is significant change in diet that may have set off symptoms Start prednisone , check labs, no acute abdomen Will recheck out to her GI for further recommendations Due to severe and multiple stools she needs to work from home. 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

## 2019-09-17 NOTE — Patient Instructions (Signed)
Continue the probiotics I will call with lab results Start the prednisone I will send a message to your GI doctor for any further recommendations for your treatment F/U pending results

## 2019-09-18 ENCOUNTER — Telehealth: Payer: Self-pay

## 2019-09-18 LAB — COMPREHENSIVE METABOLIC PANEL
AG Ratio: 1 (calc) (ref 1.0–2.5)
ALT: 17 U/L (ref 6–29)
AST: 15 U/L (ref 10–30)
Albumin: 3.7 g/dL (ref 3.6–5.1)
Alkaline phosphatase (APISO): 71 U/L (ref 31–125)
BUN/Creatinine Ratio: 5 (calc) — ABNORMAL LOW (ref 6–22)
BUN: 6 mg/dL — ABNORMAL LOW (ref 7–25)
CO2: 23 mmol/L (ref 20–32)
Calcium: 8.9 mg/dL (ref 8.6–10.2)
Chloride: 102 mmol/L (ref 98–110)
Creat: 1.17 mg/dL — ABNORMAL HIGH (ref 0.50–1.10)
Globulin: 3.8 g/dL (calc) — ABNORMAL HIGH (ref 1.9–3.7)
Glucose, Bld: 89 mg/dL (ref 65–99)
Potassium: 3.8 mmol/L (ref 3.5–5.3)
Sodium: 136 mmol/L (ref 135–146)
Total Bilirubin: 0.4 mg/dL (ref 0.2–1.2)
Total Protein: 7.5 g/dL (ref 6.1–8.1)

## 2019-09-18 LAB — CBC WITH DIFFERENTIAL/PLATELET
Absolute Monocytes: 1177 cells/uL — ABNORMAL HIGH (ref 200–950)
Basophils Absolute: 54 cells/uL (ref 0–200)
Basophils Relative: 0.5 %
Eosinophils Absolute: 150 cells/uL (ref 15–500)
Eosinophils Relative: 1.4 %
HCT: 38.8 % (ref 35.0–45.0)
Hemoglobin: 12.6 g/dL (ref 11.7–15.5)
Lymphs Abs: 2408 cells/uL (ref 850–3900)
MCH: 25.7 pg — ABNORMAL LOW (ref 27.0–33.0)
MCHC: 32.5 g/dL (ref 32.0–36.0)
MCV: 79 fL — ABNORMAL LOW (ref 80.0–100.0)
MPV: 9.4 fL (ref 7.5–12.5)
Monocytes Relative: 11 %
Neutro Abs: 6912 cells/uL (ref 1500–7800)
Neutrophils Relative %: 64.6 %
Platelets: 494 10*3/uL — ABNORMAL HIGH (ref 140–400)
RBC: 4.91 10*6/uL (ref 3.80–5.10)
RDW: 12.5 % (ref 11.0–15.0)
Total Lymphocyte: 22.5 %
WBC: 10.7 10*3/uL (ref 3.8–10.8)

## 2019-09-18 LAB — LIPASE: Lipase: 14 U/L (ref 7–60)

## 2019-09-18 LAB — SEDIMENTATION RATE: Sed Rate: 89 mm/h — ABNORMAL HIGH (ref 0–20)

## 2019-09-18 LAB — C-REACTIVE PROTEIN: CRP: 97 mg/L — ABNORMAL HIGH (ref <8.0)

## 2019-09-18 NOTE — Telephone Encounter (Signed)
-----   Message from Steven P Armbruster, MD sent at 09/18/2019  7:41 AM EDT ----- Regarding: FW: Recommendations for IBD flare Hey Jan, Any openings in the schedule to get this patient in with me or an APP in the next 2-3 weeks? I have already responded to Dr. [place] about prednisone taper recommendations. Thanks ----- Message ----- From: [place], Kawanta F, MD Sent: 09/17/2019   9:31 PM EDT To: Steven P Armbruster, MD Subject: Recommendations for IBD flare                    Hello,     I saw Ms. Ballard today, she is currently with what sounds like flare of her IBD  unclear if viral mediated or dietary.  I started prednisone taper, she has not been on MTX or Humira since 2018/2019. Her labs are in Epic.   Any other recommendations are appreciated.     K. [place]    

## 2019-09-18 NOTE — Telephone Encounter (Signed)
Booked for 09-23-2019 with Dr Adela Lank. Left a detailed msg for the patient to confirm appointment or move to an app if needed.

## 2019-09-18 NOTE — Telephone Encounter (Signed)
-----   Message from Benancio Deeds, MD sent at 09/18/2019  7:41 AM EDT ----- Regarding: FW: Recommendations for IBD flare Hey Jan, Any openings in the schedule to get this patient in with me or an APP in the next 2-3 weeks? I have already responded to Dr. Jeanice Lim about prednisone taper recommendations. Thanks ----- Message ----- From: Salley Scarlet, MD Sent: 09/17/2019   9:31 PM EDT To: Benancio Deeds, MD Subject: Recommendations for IBD flare                    Hello,     I saw Ms. Davidson today, she is currently with what sounds like flare of her IBD  unclear if viral mediated or dietary.  I started prednisone taper, she has not been on MTX or Humira since 2018/2019. Her labs are in Epic.   Any other recommendations are appreciated.     Riesa Pope

## 2019-09-18 NOTE — Telephone Encounter (Signed)
I left a detailed voicemail on patients answering machine. Office visit booked for 09-23-2019 at 11am with Dr Ellin Saba.

## 2019-09-19 ENCOUNTER — Other Ambulatory Visit: Payer: Self-pay | Admitting: *Deleted

## 2019-09-19 MED ORDER — PREDNISONE 20 MG PO TABS: 40.0000 mg | ORAL_TABLET | Freq: Every day | ORAL | 0 refills | Status: DC

## 2019-09-23 ENCOUNTER — Encounter: Payer: Self-pay | Admitting: Gastroenterology

## 2019-09-23 ENCOUNTER — Ambulatory Visit (INDEPENDENT_AMBULATORY_CARE_PROVIDER_SITE_OTHER): Payer: PRIVATE HEALTH INSURANCE | Admitting: Gastroenterology

## 2019-09-23 ENCOUNTER — Other Ambulatory Visit: Payer: PRIVATE HEALTH INSURANCE

## 2019-09-23 VITALS — BP 124/82 | HR 90 | Ht 66.0 in | Wt 211.4 lb

## 2019-09-23 DIAGNOSIS — K529 Noninfective gastroenteritis and colitis, unspecified: Secondary | ICD-10-CM | POA: Diagnosis not present

## 2019-09-23 DIAGNOSIS — K523 Indeterminate colitis: Secondary | ICD-10-CM

## 2019-09-23 MED ORDER — PREDNISONE 10 MG PO TABS: ORAL_TABLET | ORAL | 0 refills | Status: DC

## 2019-09-23 NOTE — Progress Notes (Signed)
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 Inflammation most in right and transverse colon, very mild in left colon. Had paradoxical worsening on Lialda. 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 Eventually placed on Humira and had some initial improvement but had some elevated inflammatory markers which led to testing drug levels. She had subtherapeutic Humira levels (2.4) with low level AB present (26). Inflammatory markers were positive. She was started on methotrexate 34m po q week in May [id number] with folic acid 122m Humira was changed to [id number] at that time. Her antibodies to Humira went away and her Humira level went from 2.4 to 5.7. Follow up colonoscopy in 2018 showed mucosal healing. Patient self-stopped all therapy in 2018 as she was feeling well, haven't seen her since that time.   SINCE LAST VISIT  Patient here for a follow-up visit, have not seen her since 2018.  At that time I had recommended continuing Humira and methotrexate.  She states she had a lot of anxiety about giving herself injections and stopped the Humira completely, she was then on methotrexate monotherapy for a few months and then stopped it.  She states generally she had been doing okay without many symptoms until relatively recently.  She developed joint pains in her wrist and shoulders, had been using ibuprofen, 1000 mg a few times a day, subsequently developed bowel symptoms.  For the past week or so she has had increased stool frequency, was going upwards of 8 times a day with some blood in the stool as well as abdominal cramping.  Her grandchild was out of school with a sick contact for GI illness, although she denies any contact her self.  She has not been on any recent antibiotics.  She was seen by her primary care, labs were obtained.  CRP elevated at 97, ESR elevated at 89.  White blood cell count 10.7, hemoglobin 12.6.  Vitamin D last month was  57.  She was placed on prednisone 40 mg over the past week.  She states this is definitely helped her symptoms.  Her stool frequency has reduced to about 3 bowel movements a day, generally doing better in this regard.  She states her joint pains have completely resolved as well.  We discussed options for long-term therapy in regards to treatment for her IBD.  Colonoscopy 04/07/2016 - mild ileal inflammation, small cecal patch of inflammation - otherwise normal colon - all biopsies returned as normal, in remission  IBD Health Care Maintenance: Annual Flu Vaccine- 2017 UTD,  Pneumococcal Vaccineif receiving immunosuppression: - PVC13 UTD, PSV23 UTD TB testingif on anti-TNF, yearly - Date November 2016 Vitamin Dscreening - UTD COVID 19 vaccine UTD   Past Medical History:  Diagnosis Date  . Endometriosis   . Gastritis   . H/O seasonal allergies   . IBS (irritable bowel syndrome)   . [facility]    Past Surgical History:  Procedure Laterality Date  . GANGLION CYST EXCISION Left 04/15/2018   Procedure: REMOVAL VOLAR GANGLION OF LEFT WRIST;  Surgeon: KuzLeanora CoverD;  [facility];  Laterality: Left;  . TUBAL LIGATION    . WISDOM TOOTH EXTRACTION     Family History  Problem Relation Age of Onset  . Colon cancer Other        cousins and uncles  . Breast cancer Maternal Grandmother   . Diabetes Other  several maternal relatives  . Hypertension Other        several paternal and maternal relatives  . Irritable bowel syndrome Other        mother  . Immunodeficiency Father    Social History   Tobacco Use  . Smoking status: Current Some Day Smoker  . Smokeless tobacco: Never Used  Substance Use Topics  . Alcohol use: Yes    Alcohol/week: 2.0 standard drinks    Types: 2 Standard drinks or equivalent per week    Comment: occasionally  . Drug use: No   Current Outpatient Medications  Medication Sig Dispense  Refill  . cetirizine (ZYRTEC) 10 MG tablet Take 10 mg by mouth daily as needed for allergies.    . Cholecalciferol (D-3-5) 5000 units capsule Take 2 capsules (10,000 Units total) by mouth daily.    . fluticasone (FLONASE) 50 MCG/ACT nasal spray Place 1 spray into both nostrils daily as needed for allergies. 48 g 2  . norgestimate-ethinyl estradiol (ORTHO-CYCLEN,SPRINTEC,PREVIFEM) 0.25-35 MG-MCG tablet Take 1 tablet by mouth daily.    . phentermine (ADIPEX-P) 37.5 MG tablet Take 1 tablet (37.5 mg total) by mouth daily before breakfast. 30 tablet 2  . predniSONE (DELTASONE) 20 MG tablet Take 2 tablets (40 mg total) by mouth daily with breakfast. 10 tablet 0  . vitamin C (ASCORBIC ACID) 500 MG tablet Take 1 tablet (500 mg total) by mouth daily.     No current facility-administered medications for this visit.   Allergies  Allergen Reactions  . Keflex [Cephalexin]     hives     Review of Systems: All systems reviewed and negative except where noted in HPI.   Lab Results  Component Value Date   WBC 10.7 09/17/2019   HGB 12.6 09/17/2019   HCT 38.8 09/17/2019   MCV 79.0 (L) 09/17/2019   PLT 494 (H) 09/17/2019    Lab Results  Component Value Date   CREATININE 1.17 (H) 09/17/2019   BUN 6 (L) 09/17/2019   NA 136 09/17/2019   K 3.8 09/17/2019   CL 102 09/17/2019   CO2 23 09/17/2019   Lab Results  Component Value Date   ALT 17 09/17/2019   AST 15 09/17/2019   ALKPHOS 62 10/06/2016   BILITOT 0.4 09/17/2019     Physical Exam: BP 124/82 (BP Location: Left Arm, Patient Position: Sitting)   Pulse 90   Ht '5\' 6"'$  (1.676 m)   Wt 211 lb 6.4 oz (95.9 kg)   SpO2 99%   BMI 34.12 kg/m  Constitutional: Pleasant,well-developed, female in no acute distress. HEENT: Normocephalic and atraumatic. Conjunctivae are normal. No scleral icterus. Neck supple.  Cardiovascular: Normal rate, regular rhythm.  Pulmonary/chest: Effort normal and breath sounds normal. No wheezing, rales or  rhonchi. Abdominal: Soft, nondistended, nontender.  There are no masses palpable.  Extremities: no edema Lymphadenopathy: No cervical adenopathy noted. Neurological: Alert and oriented to person place and time. Skin: Skin is warm and dry. No rashes noted. Psychiatric: Normal mood and affect. Behavior is normal.   ASSESSMENT AND PLAN: 39 year old female here for reassessment of the following:  Inflammatory bowel disease / indeterminate colitis - as above, had been doing much better on Humira and methotrexate in 2018, she has a fear of needles and decided to self discontinue Humira and subsequently stopped methotrexate as well in 2018.  She actually had been doing pretty well for the past few years but then had a flare of symptoms very recently.  Had been using NSAIDs  which could have precipitated flare, although also had a sick contact.  I will check a GI pathogen panel to ensure negative.  That being said she has responded to prednisone and her joint pains have resolved, will continue prednisone taper, will give 40 mg a day for 2 weeks and then taper slowly by 5 mg a week to prevent rebound inflammatory response.  We discussed long-term regimen for her colitis.  I recommend she avoid NSAIDs as this can potentially flare her condition.  Unfortunately she had paradoxical worsening with mesalamine so that is not an option.  She declines another injection therapy at this point due to fear of needles, will not resume Humira, would avoid Stelara in this light as well.  We discussed Remicade, Felecia Jan, and Zeposia, risks / benefits of each.  From a safety profile standpoint she would prefer Entyvio and I think that would work well for her.  I will send her to the lab for TB testing and once we have that back and her GI pathogen panel if she wants to proceed we will touch base with her insurance to see if it is covered.  We discussed goals of therapy are to treat inflammation, prevent flares of symptoms,  and reduce her risk for colon cancer.  I recommend some form of maintenance therapy as I suspect she will likely have ongoing inflammation without treatment. She is agreeable to the plan as outlined. Will await her course and contact her with results of stool studies / TB testing and plan on pursuing therapy with Entyvio if covered by her insurance.    [place] Cellar, MD [facility]

## 2019-09-23 NOTE — Patient Instructions (Addendum)
If you are age 38 or older, your body mass index should be between 23-30. Your Body mass index is 34.12 kg/m. If this is out of the aforementioned range listed, please consider follow up with your Primary Care Provider.  If you are age 72 or younger, your body mass index should be between 19-25. Your Body mass index is 34.12 kg/m. If this is out of the aformentioned range listed, please consider follow up with your Primary Care Provider.   We have sent the following medications to your pharmacy for you to pick up at your convenience: Prednisone 10mg : Take 40 mg daily for 2 weeks, then decrease by 5mg  each week    Stop Ibuprofen.   Please go to the lab in the basement of our building to have lab work done as you leave today. Hit "B" for basement when you get on the elevator.  When the doors open the lab is on your left.  We will call you with the results. Thank you.  Due to recent changes in healthcare laws, you may see the results of your imaging and laboratory studies on MyChart before your provider has had a chance to review them.  We understand that in some cases there may be results that are confusing or concerning to you. Not all laboratory results come back in the same time frame and the provider may be waiting for multiple results in order to interpret others.  Please give 48 hours in order for your provider to thoroughly review all the results before contacting the office for clarification of your results.     Thank you for entrusting me with your care and for choosing [facility], Dr. [place]

## 2019-09-24 ENCOUNTER — Other Ambulatory Visit: Payer: PRIVATE HEALTH INSURANCE

## 2019-09-24 DIAGNOSIS — K523 Indeterminate colitis: Secondary | ICD-10-CM

## 2019-09-24 DIAGNOSIS — K529 Noninfective gastroenteritis and colitis, unspecified: Secondary | ICD-10-CM

## 2019-09-26 LAB — QUANTIFERON-TB GOLD PLUS
Mitogen-NIL: 2.97 IU/mL
NIL: 0.03 IU/mL
QuantiFERON-TB Gold Plus: NEGATIVE
TB1-NIL: 0.03 IU/mL
TB2-NIL: 0.01 IU/mL

## 2019-09-28 LAB — GI PROFILE, STOOL, PCR

## 2019-09-29 ENCOUNTER — Other Ambulatory Visit: Payer: Self-pay

## 2019-09-29 DIAGNOSIS — K523 Indeterminate colitis: Secondary | ICD-10-CM

## 2019-10-01 ENCOUNTER — Telehealth: Payer: Self-pay

## 2019-10-01 NOTE — Telephone Encounter (Signed)
Dr. Meridee Score, as DOD please advise, thank you.  Armbruster patient with history of Ulcerative colitis - sending order for Entyvio to [facility] and patient does not have a recent Hepatitis B surface antigen lab - can I order this lab for patient? Thank you

## 2019-10-01 NOTE — Telephone Encounter (Signed)
Thank you for reaching out. Please move forward with obtaining the following labs: hepatitis B surface antigen, hepatitis B core antibody total, hepatitis B surface antibody, hepatitis C antibody with reflex RNA Thanks. GM

## 2019-10-01 NOTE — Telephone Encounter (Signed)
Noted  

## 2019-10-01 NOTE — Telephone Encounter (Signed)
Faxed records and order to [facility]. Called patient to inform her that orders were faxed, unable to lm vm full.

## 2019-10-01 NOTE — Telephone Encounter (Signed)
I am out of the office and just checking in and happened to see this. Gabe, thanks for your help.  [place], if they need these labs done for viral hep agree with what GM ordered, but viral hepatitis testing is typically not required for [facility], unless this is specifically mandated by her insurance company

## 2019-10-03 ENCOUNTER — Other Ambulatory Visit: Payer: Self-pay

## 2019-10-03 DIAGNOSIS — K523 Indeterminate colitis: Secondary | ICD-10-CM

## 2019-10-03 NOTE — Telephone Encounter (Signed)
Spoke with patient, she is aware to come in for Hepatitis B surface antigen lab and we will fax results to [facility] and they will get in contact with her.

## 2019-10-08 ENCOUNTER — Telehealth: Payer: Self-pay | Admitting: Gastroenterology

## 2019-10-08 DIAGNOSIS — K529 Noninfective gastroenteritis and colitis, unspecified: Secondary | ICD-10-CM

## 2019-10-08 DIAGNOSIS — K523 Indeterminate colitis: Secondary | ICD-10-CM

## 2019-10-08 NOTE — Telephone Encounter (Signed)
Will have pre-cert investigate Entyvio approval and infusion locations.

## 2019-10-09 NOTE — Telephone Encounter (Signed)
Theresa Farmer, Amy L sent to Annett Fabian, RN Ok, Theresa Farmer @ [facility] said to refer to them, and they would take care of her and get her enrolled in patient assistance as well. She said to fax records and copy of insurance card to [phone]. I gave her Theresa Farmer's name and she will be watching out for the referral to come.  Thanks,  Amy    Referral faxed.  Patient notified of new plan.  She will come for additional lab work tomorrow.

## 2019-10-10 ENCOUNTER — Other Ambulatory Visit: Payer: PRIVATE HEALTH INSURANCE

## 2019-10-10 DIAGNOSIS — K523 Indeterminate colitis: Secondary | ICD-10-CM

## 2019-10-13 LAB — HEPATITIS B SURFACE ANTIGEN: Hepatitis B Surface Ag: NONREACTIVE

## 2019-10-19 ENCOUNTER — Other Ambulatory Visit: Payer: Self-pay | Admitting: Gastroenterology

## 2019-11-25 ENCOUNTER — Telehealth: Payer: Self-pay | Admitting: Gastroenterology

## 2019-11-25 MED ORDER — PREDNISONE 10 MG PO TABS: ORAL_TABLET | ORAL | 0 refills | Status: DC

## 2019-11-25 NOTE — Telephone Encounter (Signed)
Okay thanks Jan. Let's keep prednisone 20mg  / day for 2 weeks and then taper by 5mg  / week until done. Hopefully by then the is making some progress, if not she needs to let know. Thanks for updating me.

## 2019-11-25 NOTE — Telephone Encounter (Signed)
Pt is requesting a refill on her medication prednisone 20mg 

## 2019-11-25 NOTE — Telephone Encounter (Signed)
Called and spoke to patient.  She completed the prednisone taper Dr. Adela Lank sent in August but as soon as she went below 20 mg daily, her symptoms came right back and she was having lots of diarrhea with urgency. Her next Entyvio infusion (which will be her 2nd) is this Friday. She had a little leftover prednisone from an old script so she is currently taking 20 mg of prednisone daily which has her going tot he bathroom only twice a day but it is still very loose. Her stomach is very loud and rumbling and only slightly aching. She thinks she may need to stay on low dose prednisone (at least 20 mg daily) until the [facility] really gets in her system and can start managing her symptoms.  Please advise.

## 2019-11-25 NOTE — Telephone Encounter (Signed)
New taper sent to pharmacy.Patient informed.

## 2019-12-09 ENCOUNTER — Telehealth: Payer: Self-pay | Admitting: Gastroenterology

## 2019-12-09 ENCOUNTER — Other Ambulatory Visit: Payer: Self-pay

## 2019-12-09 DIAGNOSIS — R197 Diarrhea, unspecified: Secondary | ICD-10-CM

## 2019-12-09 NOTE — Telephone Encounter (Signed)
Thanks [place], sorry to hear this. Let's check a C Diff PCR to ensure no evidence of that given Entyvio can increase the risk for C Diff. She can increase back to 40mg  prednisone if she needs to for a week or so and then taper back down again by 5mg  / week until done. If after she is taping again despite a few doses of Entyvio this continues to happen will need to discuss changing it. Does she have follow up scheduled with ? If not I'd like to see her back in the office if you can help coordinate. Thanks

## 2019-12-09 NOTE — Telephone Encounter (Signed)
Spoke with patient, she reports that her 2nd infusion of Entyvio was on 11/28/19, on 10/10 she started having 5 BMs of diarrhea every morning, pt states that on Prednisone 20 mg it will hold her until 5/5:30 pm then she will have stomach spasms. No fever. Slight headache.  Pt is staying hydrated with water and Gatorade. Pt states that everything she eats or drinks runs out of her until prednisone kicks in, 40 mg of Prednisone works and she won't have any symptoms. Pt states that she takes a probiotic everyday, she states that she knows you are tapering her down from Prednisone and doesn't want to be on it long term but it helps at 40 mg, she states that she feels like she is taking steps back. Please advise, thank you

## 2019-12-09 NOTE — Telephone Encounter (Signed)
Spoke with patient regarding recommendations from Dr. Adela Lank. Patient states that she will go by the lab tomorrow for stool study. Advised that she can increase prednisone back to 40 mg for about a week and then to taper back down by 5 mg/week until done. Advised that if she continues to have these symptoms we will need to discuss changing treatment. Patient has been scheduled for a follow up on 01/20/20 at 3:20 PM with Dr. Adela Lank. Patient verbalized understanding of all instructions and has no other concerns at this time.  Lab order in epic.

## 2019-12-11 ENCOUNTER — Other Ambulatory Visit: Payer: PRIVATE HEALTH INSURANCE

## 2019-12-12 ENCOUNTER — Other Ambulatory Visit: Payer: PRIVATE HEALTH INSURANCE

## 2019-12-12 DIAGNOSIS — R197 Diarrhea, unspecified: Secondary | ICD-10-CM

## 2019-12-16 LAB — CLOSTRIDIUM DIFFICILE EIA

## 2019-12-16 LAB — CLOSTRIDIUM DIFFICILE BY PCR

## 2019-12-19 LAB — SPECIMEN STATUS REPORT

## 2019-12-22 ENCOUNTER — Other Ambulatory Visit: Payer: Self-pay

## 2019-12-22 ENCOUNTER — Telehealth: Payer: Self-pay

## 2019-12-22 MED ORDER — PREDNISONE 10 MG PO TABS: ORAL_TABLET | ORAL | 0 refills | Status: AC

## 2019-12-22 NOTE — Telephone Encounter (Signed)
Dr. Adela Lank, just an FYI.  Spoke with patient, she states that she has not been having any diarrhea at the moment, she states that she is at 30 mg of Prednisone and only has enough for today due to increasing back to 40 mg for a week. Will send RX so that patient can finish her tapered dose. Patient would like prescription [facility] [address].   Patient is aware that we are waiting on the lab in regards to her stool study results.   Prescription sent to pharmacy.  Jessica from the lab returned call and stated that she reached out to [facility] and they said that they did not receive a call last week from our lab, they are unable to run the test since it has been more than 5 days. How would you like to proceed?

## 2019-12-22 NOTE — Telephone Encounter (Signed)
Spoke with patient regarding recommendations per Dr. Adela Lank. Patient is aware that no other stool studies are needed at this time since she is not having any more diarrhea. Advised patient that prescription for Prednisone taper has been sent in and to continue Entyvio. Patient advised to keep follow up appt that is scheduled for 01/20/20 with Dr. Adela Lank. Patient verbalized understanding of all information and has no other concerns at this time.

## 2019-12-22 NOTE — Telephone Encounter (Signed)
Thanks [place], If she is no longer having diarrhea, then no need for stool test right now. I would taper prednisone by 5mg  / week until done if you can refill her for that and continue Entyvio. I would like to see her back in the office for a follow up if you can help coordinate. Thanks

## 2019-12-24 ENCOUNTER — Telehealth: Payer: Self-pay | Admitting: Gastroenterology

## 2019-12-24 NOTE — Telephone Encounter (Signed)
Pt thinks that Prednisone is altering her blood pressure. She stated that it has been higher than usual and she has been experiencing light headache and a tightening in her legs.

## 2019-12-24 NOTE — Telephone Encounter (Signed)
Spoke with patient, she states that she has been having minor headaches for a week, describes as a functioning headache that comes and goes. Pt states that her BP readings have been elevated : 157/97, 160/100,pt reports that she never has a BP reading over 120/80. Pt states that she has also recently been experiencing tightness in her legs in the evening between her knees and ankles, she denies any leg cramping. Pt states that she has increased her water intake while on Prednisone. Pt denies starting any other medications recently, she states that this Friday she will be on Prednisone 25 mg. Asked patient if she had recently eaten any salty or processed foods, pt states that she was previously on a diet and tries to stay away from processed foods, she does not think this is the cause. Pt states that she gets the headaches in the morning and that's when her BP is high as well. Pt states that she is unsure if she is having pain somewhere else and causing a rise in BP, she thinks that her symptoms may be being suppressed since she is on prednisone. Please advise, thank you

## 2019-12-25 NOTE — Telephone Encounter (Signed)
Spoke with patient and advised of Dr. Lanetta Inch recommendations as outlined below. Advised patient to continue tapering Prednisone by 5 mg/week until gone. Patient will follow up with PCP regarding BP/headaches. Patient verbalized understanding of all information and had no concerns at the end of the call.

## 2019-12-25 NOTE — Telephone Encounter (Signed)
Sorry to hear this. She should continue weaning prednisone by 5mg  / week until done. Not sure how much the prednisone is related here, it's possible, and we will try to get her off by tapering but can't just stop it altogether as her colitis may flare. She should contact her PCP regarding the blood pressure / headaches, if she has had persistently elevated blood pressure that needs to be addressed by her PCP and probably warrants treatment at this time, defer to them regarding regimen, etc. Thanks

## 2019-12-26 ENCOUNTER — Other Ambulatory Visit: Payer: Self-pay | Admitting: *Deleted

## 2019-12-26 ENCOUNTER — Telehealth: Payer: Self-pay | Admitting: *Deleted

## 2019-12-26 MED ORDER — AMLODIPINE BESYLATE 5 MG PO TABS: 5.0000 mg | ORAL_TABLET | Freq: Every day | ORAL | 3 refills | Status: DC

## 2019-12-26 NOTE — Telephone Encounter (Signed)
Call pt this is most likely a Side effect of the steroids She should STOP  phentermine as this will worsens blood pressure, we can reconsider once she is off steroids and blood pressure is better  Start norvasc 5mg  once a day  Schedule OV in 2 weeks

## 2019-12-26 NOTE — Telephone Encounter (Signed)
Received call from patient.   States that she is currently taking a Prednisone taper for IBS and is on 25mg  daily at this time. Also reports that she is taking Entiyvio injections for IBS.   States that she has noted that her BP is fluctuating while on prednisone. Reports that Systolic BP is ranging from 140-170, and Diastolic BP is ranging from 70-90. Also reports that she is having daily throbbing HA. States that HA is not severe, but she is concerned while BP is elevated.   Patient has reported this to GI, and was advised to discuss with PCP.   Please advise.

## 2019-12-26 NOTE — Telephone Encounter (Signed)
Call placed to patient and patient made aware.   Prescription sent to pharmacy.  

## 2020-01-09 ENCOUNTER — Encounter: Payer: Self-pay | Admitting: Family Medicine

## 2020-01-09 ENCOUNTER — Ambulatory Visit (INDEPENDENT_AMBULATORY_CARE_PROVIDER_SITE_OTHER): Payer: PRIVATE HEALTH INSURANCE | Admitting: Family Medicine

## 2020-01-09 ENCOUNTER — Other Ambulatory Visit: Payer: Self-pay

## 2020-01-09 VITALS — BP 128/78 | HR 100 | Temp 98.6°F | Resp 16 | Ht 66.0 in | Wt 221.0 lb

## 2020-01-09 DIAGNOSIS — E669 Obesity, unspecified: Secondary | ICD-10-CM | POA: Diagnosis not present

## 2020-01-09 DIAGNOSIS — I159 Secondary hypertension, unspecified: Secondary | ICD-10-CM

## 2020-01-09 MED ORDER — FLUTICASONE PROPIONATE 50 MCG/ACT NA SUSP: 1.0000 | Freq: Every day | NASAL | 2 refills | Status: DC | PRN

## 2020-01-09 NOTE — Patient Instructions (Addendum)
We will call with lab results F/U 2 months  

## 2020-01-09 NOTE — Progress Notes (Signed)
   Subjective:    Patient ID: Theresa Farmer, female    DOB: Aug 18, 1980, 39 y.o.   MRN: [id number]  Patient presents for Follow-up (is not fasting)  Pt here to f/u blood pressure She is now on Entyvio, she had 3rd dose today She is down to 15mg  of prednisone  Next wed she tapers down to 10mg  of prednisone  She was retaining fluid but that has improved but she does feel her feet are still swelling and feel tight in the evening, the swelling goes in the morning   Weight up 7lbs since August.  She is taking norvasc 5mg  once a day,for elevated bp since starting high dose sterids, no chest pain no SOB    Review Of Systems:  GEN- denies fatigue, fever, weight loss,weakness, recent illness HEENT- denies eye drainage, change in vision, nasal discharge, CVS- denies chest pain, palpitations RESP- denies SOB, cough, wheeze ABD- denies N/V, change in stools, abd pain GU- denies dysuria, hematuria, dribbling, incontinence MSK- denies joint pain, muscle aches, injury Neuro- denies headache, dizziness, syncope, seizure activity       Objective:    BP 128/78   Pulse 100   Temp 98.6 F (37 C) (Temporal)   Resp 16   Ht 5\' 6"  (1.676 m)   Wt 221 lb (100.2 kg)   SpO2 97%   BMI 35.67 kg/m  GEN- NAD, alert and oriented x3 HEENT- PERRL, EOMI, non injected sclera, pink conjunctiva, MMM, oropharynx clear Neck- Supple, no thyromegaly CVS- RRR, no murmur RESP-CTAB ABD-NABS,soft,NT,ND EXT- trace ankle edema  Pulses- Radial, DP- 2+        Assessment & Plan:      Problem List Items Addressed This Visit      Unprioritized   Class 2 obesity - Primary   Relevant Orders   CBC with Differential/Platelet (Completed)   Comprehensive metabolic panel (Completed)   Hemoglobin A1c (Completed)   Secondary hypertension - Steroid induced     I think her elevated BP due to steroids Controlled on norvasc, expect as she tapers off steroids BP will return to normal and likely wll be able to come off  meds Advised once off steroids, wait one 1 week, then D/C norvasc and check BP Today will check renal function and A1C with weight gain Reduce salt in diet to help with sodium retention as well  While norvasc can cause peripheral edema so can high dose steroids      Relevant Orders   CBC with Differential/Platelet (Completed)   Comprehensive metabolic panel (Completed)      Note: This dictation was prepared with Dragon dictation along with smaller phrase technology. Any transcriptional errors that result from this process are unintentional.

## 2020-01-10 ENCOUNTER — Encounter: Payer: Self-pay | Admitting: Family Medicine

## 2020-01-10 LAB — CBC WITH DIFFERENTIAL/PLATELET
Absolute Monocytes: 574 cells/uL (ref 200–950)
Basophils Absolute: 35 cells/uL (ref 0–200)
Basophils Relative: 0.2 %
Eosinophils Absolute: 35 cells/uL (ref 15–500)
Eosinophils Relative: 0.2 %
HCT: 44.5 % (ref 35.0–45.0)
Hemoglobin: 14.6 g/dL (ref 11.7–15.5)
Lymphs Abs: 2140 cells/uL (ref 850–3900)
MCH: 27.1 pg (ref 27.0–33.0)
MCHC: 32.8 g/dL (ref 32.0–36.0)
MCV: 82.6 fL (ref 80.0–100.0)
MPV: 10.1 fL (ref 7.5–12.5)
Monocytes Relative: 3.3 %
Neutro Abs: 14616 cells/uL — ABNORMAL HIGH (ref 1500–7800)
Neutrophils Relative %: 84 %
Platelets: 332 10*3/uL (ref 140–400)
RBC: 5.39 10*6/uL — ABNORMAL HIGH (ref 3.80–5.10)
RDW: 13 % (ref 11.0–15.0)
Total Lymphocyte: 12.3 %
WBC: 17.4 10*3/uL — ABNORMAL HIGH (ref 3.8–10.8)

## 2020-01-10 LAB — COMPREHENSIVE METABOLIC PANEL
AG Ratio: 1.2 (calc) (ref 1.0–2.5)
ALT: 35 U/L — ABNORMAL HIGH (ref 6–29)
AST: 21 U/L (ref 10–30)
Albumin: 4.1 g/dL (ref 3.6–5.1)
Alkaline phosphatase (APISO): 61 U/L (ref 31–125)
BUN: 13 mg/dL (ref 7–25)
CO2: 26 mmol/L (ref 20–32)
Calcium: 9.8 mg/dL (ref 8.6–10.2)
Chloride: 102 mmol/L (ref 98–110)
Creat: 1.06 mg/dL (ref 0.50–1.10)
Globulin: 3.5 g/dL (calc) (ref 1.9–3.7)
Glucose, Bld: 95 mg/dL (ref 65–99)
Potassium: 4.7 mmol/L (ref 3.5–5.3)
Sodium: 137 mmol/L (ref 135–146)
Total Bilirubin: 0.5 mg/dL (ref 0.2–1.2)
Total Protein: 7.6 g/dL (ref 6.1–8.1)

## 2020-01-10 LAB — HEMOGLOBIN A1C
Hgb A1c MFr Bld: 5.7 % of total Hgb — ABNORMAL HIGH (ref <5.7)
Mean Plasma Glucose: 117 (calc)
eAG (mmol/L): 6.5 (calc)

## 2020-01-10 NOTE — Addendum Note (Signed)
Addended by: Milinda Antis F on: 01/10/2020 10:11 AM   Modules accepted: Orders

## 2020-01-10 NOTE — Assessment & Plan Note (Signed)
I think her elevated BP due to steroids Controlled on norvasc, expect as she tapers off steroids BP will return to normal and likely wll be able to come off meds Advised once off steroids, wait one 1 week, then D/C norvasc and check BP Today will check renal function and A1C with weight gain Reduce salt in diet to help with sodium retention as well  While norvasc can cause peripheral edema so can high dose steroids

## 2020-01-20 ENCOUNTER — Ambulatory Visit (INDEPENDENT_AMBULATORY_CARE_PROVIDER_SITE_OTHER): Payer: PRIVATE HEALTH INSURANCE | Admitting: Gastroenterology

## 2020-01-20 ENCOUNTER — Encounter: Payer: Self-pay | Admitting: Gastroenterology

## 2020-01-20 VITALS — BP 126/88 | HR 79 | Ht 65.0 in | Wt 224.1 lb

## 2020-01-20 DIAGNOSIS — K523 Indeterminate colitis: Secondary | ICD-10-CM

## 2020-01-20 NOTE — Patient Instructions (Addendum)
If you are age 38 or older, your body mass index should be between 23-30. Your Body mass index is 37.3 kg/m. If this is out of the aforementioned range listed, please consider follow up with your Primary Care Provider.  If you are age 63 or younger, your body mass index should be between 19-25. Your Body mass index is 37.3 kg/m. If this is out of the aformentioned range listed, please consider follow up with your Primary Care Provider.   Continue prednisone taper.  Continue Entyvio.  We have scheduled you for a follow up appointment on Tuesday, 2-15 at 3:20pm.  Please call to reschedule as soon as possible if this date or time is not convenient.  Thank you for entrusting me with your care and for choosing [facility], Dr. Ileene Patrick

## 2020-01-20 NOTE — Progress Notes (Signed)
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 Had paradoxical worsening on Lialda.TPMT testing showed intermediate metabolizer. Eventually placed on Humira and had some initial improvement but had some elevated inflammatory markers which led to testing drug levels.She hadsubtherapeutic Humira levels (2.4) with low level AB present (26). Inflammatory markers were positive. She was started on methotrexate 15mg  po q week in May 2017 along with folic acid 1mg . Humira was changed to 40mg  weekly at that time. Her antibodies to Humira went away and herHumira level went from 2.4 to 5.7. Follow up colonoscopy in 2018 showed mucosal healing. Patient self-stopped all therapy in 2018 as she was feeling well, haven't seen her since that time.  She represented in 09/2019 on no medical therapy, she had fear of needles and declined Humira.  Placed on steroids and then transition to [facility]   SINCE LAST VISIT  Patient is doing really well since have last seen her.  She has started Entyvio infusions, she just had her third dose or so.  She states she is tolerating it well without any problems.  She was on a prednisone taper starting at 40 mg a day for 2 weeks then down by 5 mg/week.  She is currently on 10 mg a day and do to go to 5 mg tomorrow.  She is a bit anxious about coming off her prednisone and fear of flaring however she has had some weight gain with the prednisone and wants to come off it.  She denies any blood in her stools.  She is having a bowel movement at her previous baseline, no diarrhea.  No abdominal pains.  Due to steroids she also developed some hypertension, was placed on Norvasc for her blood pressure which has worked well for her.  She is hoping as she comes off steroids she can eventually come off Norvasc.  We discussed long-term management of her colitis.  Generally  she is pretty happy with the regimen so far and hopeful she can come off steroids and continue to feel well.  Colonoscopy 04/07/2016 - mild ileal inflammation, small cecal patch of inflammation - otherwise normal colon - all biopsies returned as normal, in remission  IBD Health Care Maintenance: Pneumococcal Vaccineif receiving immunosuppression: - PVC13 UTD, PSV23 UTD TB testingif on anti-TNF, yearly - Date November 2016 Vitamin Dscreening - UTD COVID 19 vaccine UTD  Past Medical History:  Diagnosis Date  . Endometriosis   . Gastritis   . H/O seasonal allergies   . IBS (irritable bowel syndrome)   . Ulcerative colitis [facility])      Past Surgical History:  Procedure Laterality Date  . GANGLION CYST EXCISION Left 04/15/2018   Procedure: REMOVAL VOLAR GANGLION OF LEFT WRIST;  Surgeon: December 2016, MD;  Location: [facility];  Service: Orthopedics;  Laterality: Left;  . TUBAL LIGATION    . WISDOM TOOTH EXTRACTION     Family History  Problem Relation Age of Onset  . Colon cancer Other        cousins and uncles  . Breast cancer Maternal Grandmother   . Diabetes Other        several maternal relatives  . Hypertension Other        several paternal and maternal relatives  . Irritable bowel syndrome Other        mother  . Immunodeficiency Father   . Bullous pemphigoid Father   .  Rheum arthritis Mother   . Lupus Sister    Social History   Tobacco Use  . Smoking status: Never Smoker  . Smokeless tobacco: Never Used  Vaping Use  . Vaping Use: Never used  Substance Use Topics  . Alcohol use: Not Currently    Alcohol/week: 2.0 standard drinks    Types: 2 Standard drinks or equivalent per week    Comment: occasionally  . Drug use: Not Currently    Types: Marijuana   Current Outpatient Medications  Medication Sig Dispense Refill  . amLODipine (NORVASC) 5 MG tablet Take 1 tablet (5 mg total) by mouth daily. 90 tablet 3  . cetirizine (ZYRTEC) 10 MG tablet  Take 10 mg by mouth daily as needed for allergies.    . Cholecalciferol (D-3-5) 5000 units capsule Take 2 capsules (10,000 Units total) by mouth daily.    . ENTYVIO 300 MG injection Inject into the vein.    Marland Kitchen LARISSIA 0.1-20 MG-MCG tablet Take 1 tablet by mouth daily.    . predniSONE (DELTASONE) 10 MG tablet Take 3 tablets (30 mg total) by mouth daily with breakfast for 3 days, THEN 2.5 tablets (25 mg total) daily with breakfast for 7 days, THEN 2 tablets (20 mg total) daily with breakfast for 7 days, THEN 1.5 tablets (15 mg total) daily with breakfast for 7 days, THEN 1 tablet (10 mg total) daily with breakfast for 7 days, THEN 0.5 tablets (5 mg total) daily with breakfast for 7 days. 61.5 tablet 0  . vitamin C (ASCORBIC ACID) 500 MG tablet Take 1 tablet (500 mg total) by mouth daily.    . fluticasone (FLONASE) 50 MCG/ACT nasal spray Place 1 spray into both nostrils daily as needed for allergies. (Patient not taking: Reported on 01/20/2020) 48 g 2   No current facility-administered medications for this visit.   Allergies  Allergen Reactions  . Keflex [Cephalexin]     hives     Review of Systems: All systems reviewed and negative except where noted in HPI.   Lab Results  Component Value Date   WBC 17.4 (H) 01/09/2020   HGB 14.6 01/09/2020   HCT 44.5 01/09/2020   MCV 82.6 01/09/2020   PLT 332 01/09/2020    Lab Results  Component Value Date   CREATININE 1.06 01/09/2020   BUN 13 01/09/2020   NA 137 01/09/2020   K 4.7 01/09/2020   CL 102 01/09/2020   CO2 26 01/09/2020    Lab Results  Component Value Date   ALT 35 (H) 01/09/2020   AST 21 01/09/2020   ALKPHOS 62 10/06/2016   BILITOT 0.5 01/09/2020     Physical Exam: BP 126/88   Pulse 79   Ht 5\' 5"  (1.651 m)   Wt 224 lb 2 oz (101.7 kg)   BMI 37.30 kg/m  Constitutional: Pleasant,well-developed, female in no acute distress. Neurological: Alert and oriented to person place and time. Psychiatric: Normal mood and affect.  Behavior is normal.   ASSESSMENT AND PLAN: 39 year old female here for reassessment of the following:  Indeterminate colitis/ IBD - as above, had paradoxical worsening on mesalamine.  She did well on Humira and methotrexate but had fear of needles and could not continue the regimen.  I did not see her for a few years and she represented with a flare.  Was able to get control disease with prednisone and ultimately transitioned her to [facility] in recent months.  She is now tolerating the regimen well.  She is tapering  the prednisone to 5 mg tomorrow for 1 week and then off, hopefully she does well with Entyvio monotherapy moving forward.  We discussed long-term plan, I would recommend continuing this indefinitely given her course in recent years, high likelihood for recurrent disease off therapy.  Recent labs as above, leukocytosis in part likely due to steroids, this will need to be checked up on in the next several weeks.  I will see her again in 3 months for reassessment, we will plan on doing a colonoscopy perhaps in the spring time or so to assess for mucosal healing on Entyvio.  If she has any flare of symptoms while coming off the prednisone moving forward she should contact me.  Otherwise we discussed the Covid booster, recommend she get this when she is able to.  All questions answered she agreed with the plan.  Ileene Patrick, MD [facility]

## 2020-01-23 ENCOUNTER — Other Ambulatory Visit: Payer: Self-pay | Admitting: *Deleted

## 2020-01-23 MED ORDER — AMLODIPINE BESYLATE 5 MG PO TABS
5.0000 mg | ORAL_TABLET | Freq: Every day | ORAL | 3 refills | Status: DC
Start: 2020-01-23 — End: 2020-03-31

## 2020-03-30 ENCOUNTER — Ambulatory Visit: Payer: PRIVATE HEALTH INSURANCE | Admitting: Family Medicine

## 2020-03-31 ENCOUNTER — Encounter: Payer: Self-pay | Admitting: Family Medicine

## 2020-03-31 ENCOUNTER — Other Ambulatory Visit: Payer: Self-pay

## 2020-03-31 ENCOUNTER — Ambulatory Visit (INDEPENDENT_AMBULATORY_CARE_PROVIDER_SITE_OTHER): Payer: PRIVATE HEALTH INSURANCE | Admitting: Family Medicine

## 2020-03-31 VITALS — BP 118/64 | HR 84 | Temp 98.5°F | Resp 14 | Ht 65.0 in | Wt 228.0 lb

## 2020-03-31 DIAGNOSIS — E669 Obesity, unspecified: Secondary | ICD-10-CM

## 2020-03-31 DIAGNOSIS — B9689 Other specified bacterial agents as the cause of diseases classified elsewhere: Secondary | ICD-10-CM

## 2020-03-31 DIAGNOSIS — K529 Noninfective gastroenteritis and colitis, unspecified: Secondary | ICD-10-CM | POA: Diagnosis not present

## 2020-03-31 DIAGNOSIS — R7989 Other specified abnormal findings of blood chemistry: Secondary | ICD-10-CM

## 2020-03-31 DIAGNOSIS — I159 Secondary hypertension, unspecified: Secondary | ICD-10-CM

## 2020-03-31 DIAGNOSIS — N760 Acute vaginitis: Secondary | ICD-10-CM

## 2020-03-31 DIAGNOSIS — T380X5A Adverse effect of glucocorticoids and synthetic analogues, initial encounter: Secondary | ICD-10-CM

## 2020-03-31 DIAGNOSIS — R739 Hyperglycemia, unspecified: Secondary | ICD-10-CM | POA: Diagnosis not present

## 2020-03-31 DIAGNOSIS — N898 Other specified noninflammatory disorders of vagina: Secondary | ICD-10-CM

## 2020-03-31 LAB — WET PREP FOR TRICH, YEAST, CLUE

## 2020-03-31 MED ORDER — METRONIDAZOLE 500 MG PO TABS: 500.0000 mg | ORAL_TABLET | Freq: Two times a day (BID) | ORAL | 0 refills | Status: DC

## 2020-03-31 NOTE — Progress Notes (Signed)
Subjective:    Patient ID: Theresa Farmer, female    DOB: [birth year], 40 y.o.   MRN: [id number]  Patient presents for Follow-up (Is not fasting)  Patient here for follow-up medications reviewed. Last visit in November her weight was 221 pounds BMI 35.6 and amount of weight gain was secondary to prednisone which she has been on secondary to her inflammatory bowel disease.  She is now off steroids since mid december  Hypertension steroid-induced she has been on Norvasc 5 mg once a day.  Is also following low-carb diet with reduced sodium to control blood pressure. 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 Her last lipid panel was normal in June 2021  She has noticed vaginal dryness since she was on the steroids, she is OCP and has cycle every 3 months Has last cycle was very painful and she had multiple BM with bowel movements  She has upcoming appt with GYN  She has discomfort with intercourse due to the dryness  She has tried [employer] but it doesn't last long  Menses recently ended   Review Of Systems:  GEN- denies fatigue, fever, weight loss,weakness, recent illness HEENT- denies eye drainage, change in vision, nasal discharge, CVS- denies chest pain, palpitations RESP- denies SOB, cough, wheeze ABD- denies N/V, change in stools, abd pain GU- denies dysuria, hematuria, dribbling, incontinence MSK- denies joint pain, muscle aches, injury Neuro- denies headache, dizziness, syncope, seizure activity       Objective:    BP 118/64   Pulse 84   Temp 98.5 F (36.9 C) (Temporal)   Resp 14   Ht 5\' 5"  (1.651 m)   Wt 228 lb (103.4 kg)   SpO2 97%   BMI 37.94 kg/m  GEN- NAD, alert and oriented x3 HEENT- PERRL, EOMI, non injected sclera, pink  conjunctiva,  Neck- Supple, no thyromegaly CVS- RRR, no murmur RESP-CTAB ABD-NABS,soft,NT,ND, GU- normal external genitalia, vaginal mucosa pink and moist, cervix visualized no growth, + brown blood form os, no CMT, no ovarian masses, uterus normal size EXT- No edema Pulses- Radial  2+        Assessment & Plan:      Problem List Items Addressed This Visit      Unprioritized   Class 2 obesity   IBD (inflammatory bowel disease)   Secondary hypertension - Steroid induced  - Primary    Improved off steroids No current meds needed      Relevant Orders   Comprehensive metabolic panel    Other Visit Diagnoses    Steroid-induced hyperglycemia       Relevant Orders   Hemoglobin A1c   Vaginal irritation       worsened since the high dose steroids, can try REPENS    Relevant Orders   WET PREP FOR TRICH, YEAST, CLUE (Completed)   Elevated liver function tests       Recheck LFT   Relevant Orders   Comprehensive metabolic panel   BV (bacterial vaginosis)       Treat with Flagyl 500mg  BID x 7 days   Relevant Medications   metroNIDAZOLE (FLAGYL) 500 MG tablet      Note: This dictation was prepared with Dragon dictation along with smaller  Company secretary. Any transcriptional errors that result from this process are unintentional.

## 2020-03-31 NOTE — Patient Instructions (Signed)
We will call with results Try Replens vaginal moisturizer

## 2020-03-31 NOTE — Assessment & Plan Note (Signed)
Improved off steroids No current meds needed

## 2020-04-01 LAB — COMPREHENSIVE METABOLIC PANEL
AG Ratio: 1.2 (calc) (ref 1.0–2.5)
ALT: 11 U/L (ref 6–29)
AST: 13 U/L (ref 10–30)
Albumin: 3.7 g/dL (ref 3.6–5.1)
Alkaline phosphatase (APISO): 68 U/L (ref 31–125)
BUN/Creatinine Ratio: 10 (calc) (ref 6–22)
BUN: 11 mg/dL (ref 7–25)
CO2: 26 mmol/L (ref 20–32)
Calcium: 9 mg/dL (ref 8.6–10.2)
Chloride: 106 mmol/L (ref 98–110)
Creat: 1.14 mg/dL — ABNORMAL HIGH (ref 0.50–1.10)
Globulin: 3.1 g/dL (calc) (ref 1.9–3.7)
Glucose, Bld: 96 mg/dL (ref 65–99)
Potassium: 3.7 mmol/L (ref 3.5–5.3)
Sodium: 141 mmol/L (ref 135–146)
Total Bilirubin: 0.4 mg/dL (ref 0.2–1.2)
Total Protein: 6.8 g/dL (ref 6.1–8.1)

## 2020-04-01 LAB — HEMOGLOBIN A1C
Hgb A1c MFr Bld: 5.5 % of total Hgb (ref <5.7)
Mean Plasma Glucose: 111 mg/dL
eAG (mmol/L): 6.2 mmol/L

## 2020-04-06 ENCOUNTER — Other Ambulatory Visit: Payer: Self-pay

## 2020-04-06 ENCOUNTER — Ambulatory Visit (INDEPENDENT_AMBULATORY_CARE_PROVIDER_SITE_OTHER): Payer: PRIVATE HEALTH INSURANCE | Admitting: Gastroenterology

## 2020-04-06 ENCOUNTER — Encounter: Payer: Self-pay | Admitting: Gastroenterology

## 2020-04-06 VITALS — BP 130/80 | HR 76 | Ht 65.5 in | Wt 230.5 lb

## 2020-04-06 DIAGNOSIS — K523 Indeterminate colitis: Secondary | ICD-10-CM | POA: Diagnosis not present

## 2020-04-06 NOTE — Patient Instructions (Signed)
If you are age 39 or older, your body mass index should be between 23-30. Your Body mass index is 37.77 kg/m. If this is out of the aforementioned range listed, please consider follow up with your Primary Care Provider.  If you are age 21 or younger, your body mass index should be between 19-25. Your Body mass index is 37.77 kg/m. If this is out of the aformentioned range listed, please consider follow up with your Primary Care Provider.   We will contact you to get you scheduled for a colonoscopy with Dr. Adela Lank.  Thank you for entrusting me with your care and for choosing [facility], Dr. Ileene Patrick

## 2020-04-06 NOTE — Progress Notes (Signed)
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 Eventually placed on Humira and had some initial improvement but had some elevated inflammatory markers which led to testing drug levels.She hadsubtherapeutic Humira levels (2.4) with low level AB present (26). Inflammatory markers were positive. She was started on methotrexate 15mg  po q week in May 2017 along with folic acid 1mg . Humira was changed to 40mg  weekly at that time. Her antibodies to Humira went away and herHumira level went from 2.4 to 5.7.Follow up colonoscopy in 2018 showed mucosal healing. Patient self-stopped all therapy in 2018 as she was feeling well, haven't seen her since that time.  She represented in 09/2019 on no medical therapy, she had fear of needles and declined Humira.  Placed on steroids and then transition to [facility] Sept 2021.  SINCE LAST VISIT  Patient here for follow-up visit.  Since her last seen her she has been doing extremely well on Entyvio monotherapy.  She has successfully tapered off of steroids over 2 months ago and has not had any flare of symptoms.  Her diarrhea has resolved.  No abdominal pains.  No blood in her stools.  Structurally has slight constipation at times.  She tolerates interview quite well.  No complications of treatment plan so far. We discussed long-term management of her colitis.  Generally she is pretty happy with the regimen so far   Colonoscopy 04/07/2016 - mild ileal inflammation, small cecal patch of inflammation - otherwise normal colon - all biopsies returned as normal, in remission  IBD Health Care Maintenance: Pneumococcal Vaccineif receiving immunosuppression: - PVC13 UTD, PSV23UTD TB testingif on anti-TNF, yearly - Date 8/21 Vitamin Dscreening -UTD - level of 57  on 6/21 COVID 19 vaccine UTD  Past Medical History:  Diagnosis Date  . Endometriosis   . Gastritis   . H/O seasonal allergies   . IBS (irritable bowel syndrome)   . Ulcerative colitis [facility])      Past Surgical History:  Procedure Laterality Date  . GANGLION CYST EXCISION Left 04/15/2018   Procedure: REMOVAL VOLAR GANGLION OF LEFT WRIST;  Surgeon: 7/21, MD;  Location: [facility];  Service: Orthopedics;  Laterality: Left;  . TUBAL LIGATION    . WISDOM TOOTH EXTRACTION     Family History  Problem Relation Age of Onset  . Colon cancer Other        cousins and uncles  . Breast cancer Maternal Grandmother   . Diabetes Other        several maternal relatives  . Hypertension Other        several paternal and maternal relatives  . Irritable bowel syndrome Other        mother  . Immunodeficiency Father   . Bullous pemphigoid Father   . Rheum arthritis Mother   . Lupus Sister    Social History   Tobacco Use  . Smoking status: Never Smoker  . Smokeless tobacco: Never Used  Vaping Use  . Vaping Use: Never used  Substance Use Topics  . Alcohol use: Not Currently    Alcohol/week: 2.0 standard drinks    Types: 2 Standard drinks or equivalent per week    Comment: occasionally  . Drug use: Not Currently    Types: Marijuana   Current Outpatient Medications  Medication Sig Dispense Refill  . cetirizine (ZYRTEC) 10 MG tablet Take  10 mg by mouth daily as needed for allergies.    . Cholecalciferol (D-3-5) 5000 units capsule Take 2 capsules (10,000 Units total) by mouth daily.    . ENTYVIO 300 MG injection Inject into the vein.    . fluticasone (FLONASE) 50 MCG/ACT nasal spray Place 1 spray into both nostrils daily as needed for allergies. 48 g 2  . LARISSIA 0.1-20 MG-MCG tablet Take 1 tablet by mouth daily.    . metroNIDAZOLE (FLAGYL) 500 MG tablet Take 1 tablet (500 mg total) by mouth 2 (two) times daily. 14 tablet 0  . vitamin C (ASCORBIC ACID) 500 MG tablet  Take 1 tablet (500 mg total) by mouth daily.     No current facility-administered medications for this visit.   Allergies  Allergen Reactions  . Keflex [Cephalexin]     hives     Review of Systems: All systems reviewed and negative except where noted in HPI.   Lab Results  Component Value Date   ALT 11 03/31/2020   AST 13 03/31/2020   ALKPHOS 62 10/06/2016   BILITOT 0.4 03/31/2020    Lab Results  Component Value Date   CREATININE 1.14 (H) 03/31/2020   BUN 11 03/31/2020   NA 141 03/31/2020   K 3.7 03/31/2020   CL 106 03/31/2020   CO2 26 03/31/2020      Physical Exam: BP 130/80 (BP Location: Left Arm, Patient Position: Sitting, Cuff Size: Normal)   Pulse 76   Ht 5' 5.5" (1.664 m) Comment: height measured without shoes  Wt 230 lb 8 oz (104.6 kg)   BMI 37.77 kg/m  Constitutional: Pleasant,well-developed, female in no acute distress. Neurological: Alert and oriented to person place and time. Psychiatric: Normal mood and affect. Behavior is normal.   ASSESSMENT AND PLAN: 40 year old female here for reassessment of the following:  Indeterminate colitis/ IBD - as above, had paradoxical worsening on mesalamine.  She did well on Humira and methotrexate but had fear of needles and could not continue the regimen.  I did not see her for a few years and she represented with a flare.  Was able to get control disease with prednisone and ultimately transitioned her to [facility] in recent months.    Overall has had an excellent response.  Asymptomatic, off all steroids for the past 2 months and doing well.  We discussed goals of therapy are to eliminate symptoms and function is if she does not have colitis, and to reduce her risk of colon cancer.  At this point time I am recommending a surveillance colonoscopy to ensure she is in remission and to reassess her disease state following several months worth of Entyvio at this point.  I discussed risk benefits of colonoscopy and anesthesia  and she wants to proceed, further recommendations pending results.  Ileene Patrick, MD [facility]

## 2020-04-08 ENCOUNTER — Encounter: Payer: Self-pay | Admitting: Gastroenterology

## 2020-04-14 ENCOUNTER — Ambulatory Visit: Payer: PRIVATE HEALTH INSURANCE | Admitting: Gastroenterology

## 2020-05-12 ENCOUNTER — Other Ambulatory Visit: Payer: Self-pay

## 2020-05-12 ENCOUNTER — Ambulatory Visit (AMBULATORY_SURGERY_CENTER): Payer: Self-pay | Admitting: *Deleted

## 2020-05-12 VITALS — Ht 65.5 in | Wt 229.0 lb

## 2020-05-12 DIAGNOSIS — K523 Indeterminate colitis: Secondary | ICD-10-CM

## 2020-05-12 MED ORDER — NA SULFATE-K SULFATE-MG SULF 17.5-3.13-1.6 GM/177ML PO SOLN: ORAL | 0 refills | Status: DC

## 2020-05-12 NOTE — Progress Notes (Signed)
Patient is here in-person for PV. Patient denies any allergies to eggs or soy. Patient denies any problems with anesthesia/sedation. Patient denies any oxygen use at home. Patient denies taking any diet/weight loss medications or blood thinners. Patient is not being treated for MRSA or C-diff. Patient is aware of our care-partner policy and Covid-19 safety protocol. Patient is fully COVID-19 vaccinated, per patient.   Prep Prescription coupon given to the patient. 

## 2020-05-26 ENCOUNTER — Other Ambulatory Visit: Payer: Self-pay

## 2020-05-26 ENCOUNTER — Ambulatory Visit (AMBULATORY_SURGERY_CENTER): Payer: PRIVATE HEALTH INSURANCE | Admitting: Gastroenterology

## 2020-05-26 ENCOUNTER — Encounter: Payer: Self-pay | Admitting: Gastroenterology

## 2020-05-26 VITALS — BP 126/83 | HR 71 | Temp 97.8°F | Resp 21 | Ht 65.5 in | Wt 229.0 lb

## 2020-05-26 DIAGNOSIS — K523 Indeterminate colitis: Secondary | ICD-10-CM | POA: Diagnosis present

## 2020-05-26 DIAGNOSIS — K529 Noninfective gastroenteritis and colitis, unspecified: Secondary | ICD-10-CM | POA: Diagnosis not present

## 2020-05-26 MED ORDER — SODIUM CHLORIDE 0.9 % IV SOLN: 500.0000 mL | Freq: Once | INTRAVENOUS | Status: DC

## 2020-05-26 NOTE — Progress Notes (Signed)
Report to PACU, RN, vss, BBS= Clear.  

## 2020-05-26 NOTE — Op Note (Signed)
[facility] Patient Name: Theresa Farmer Procedure Date: 05/26/2020 10:18 AM MRN: [id number] Endoscopist: Viviann Spare P. Adela Lank , MD Age: 40 Referring MD:  Date of Birth: Nov 12, 1980 Gender: Female Account #: [id number] Procedure:                Colonoscopy Indications:              Follow-up of indeterminate colitis - on Entyvio                            since Sept 2021, off steroids and clinically doing                            well, asymptomatic Medicines:                Monitored Anesthesia Care Procedure:                Pre-Anesthesia Assessment:                           - Prior to the procedure, a History and Physical                            was performed, and patient medications and                            allergies were reviewed. The patient's tolerance of                            previous anesthesia was also reviewed. The risks                            and benefits of the procedure and the sedation                            options and risks were discussed with the patient.                            All questions were answered, and informed consent                            was obtained. Prior Anticoagulants: The patient has                            taken no previous anticoagulant or antiplatelet                            agents. ASA Grade Assessment: II - A patient with                            mild systemic disease. After reviewing the risks                            and benefits, the patient was deemed in  satisfactory condition to undergo the procedure.                           After obtaining informed consent, the colonoscope                            was passed under direct vision. Throughout the                            procedure, the patient's blood pressure, pulse, and                            oxygen saturations were monitored continuously. The                            Olympus CF-HQ190 [id number])  Colonoscope was                            introduced through the anus and advanced to the the                            terminal ileum, with identification of the                            appendiceal orifice and IC valve. The colonoscopy                            was performed without difficulty. The patient                            tolerated the procedure well. The quality of the                            bowel preparation was good. The terminal ileum,                            ileocecal valve, appendiceal orifice, and rectum                            were photographed. Scope In: 10:36:07 AM Scope Out: 10:55:29 AM Scope Withdrawal Time: 0 hours 15 minutes 38 seconds  Total Procedure Duration: 0 hours 19 minutes 22 seconds  Findings:                 The perianal and digital rectal examinations were                            normal.                           The terminal ileum appeared normal.                           Patchy mild inflammation characterized by small  patchy areas of erosions, erythema and granularity                            was found in the ascending colon and in the cecum.                            Overall severity is mild                           Internal hemorrhoids were found during                            retroflexion. The hemorrhoids were small.                           The exam was otherwise without abnormality. No                            inflammation of the rest of the colon appreciated.                           Biopsies were taken with a cold forceps from the                            right colon, left colon, transverse colon and                            rectum. These biopsy specimens were sent to                            Pathology. Complications:            No immediate complications. Estimated blood loss:                            Minimal. Estimated Blood Loss:     Estimated blood loss was  minimal. Impression:               - The examined portion of the ileum was normal.                           - Patchy focal areas of mild inflammation of the                            right colon.                           - Internal hemorrhoids.                           - The examination was otherwise normal.                           - Biopsies for surveillance were taken from the  right colon, left colon, transverse colon and                            rectum.                           Generally, inflammation in right colon is mild,                            patient is asymptomatic and doing well on present                            regimen of Entyvio, has not needed steroids since                            starting it. Recommendation:           - Patient has a contact number available for                            emergencies. The signs and symptoms of potential                            delayed complications were discussed with the                            patient. Return to normal activities tomorrow.                            Written discharge instructions were provided to the                            patient.                           - Resume previous diet.                           - Continue present medications Thompson Grayer)                           - Await pathology results.                           - Will plan on checking Entyvio trough levels /                            antibody levels at next infusion, our office will                            coordinate [employer]. Leanne Sisler, MD 05/26/2020 11:03:25 AM This report has been signed electronically.

## 2020-05-26 NOTE — Patient Instructions (Signed)
YOU HAD AN ENDOSCOPIC PROCEDURE TODAY AT THE [facility]:   Refer to the procedure report that was given to you for any specific questions about what was found during the examination.  If the procedure report does not answer your questions, please call your gastroenterologist to clarify.  If you requested that your care partner not be given the details of your procedure findings, then the procedure report has been included in a sealed envelope for you to review at your convenience later.  YOU SHOULD EXPECT: Some feelings of bloating in the abdomen. Passage of more gas than usual.  Walking can help get rid of the air that was put into your GI tract during the procedure and reduce the bloating. If you had a lower endoscopy (such as a colonoscopy or flexible sigmoidoscopy) you may notice spotting of blood in your stool or on the toilet paper. If you underwent a bowel prep for your procedure, you may not have a normal bowel movement for a few days.  Please Note:  You might notice some irritation and congestion in your nose or some drainage.  This is from the oxygen used during your procedure.  There is no need for concern and it should clear up in a day or so.  SYMPTOMS TO REPORT IMMEDIATELY:   Following lower endoscopy (colonoscopy or flexible sigmoidoscopy):  Excessive amounts of blood in the stool  Significant tenderness or worsening of abdominal pains  Swelling of the abdomen that is new, acute  Fever of 100F or higher    For urgent or emergent issues, a gastroenterologist can be reached at any hour by calling [phone]. Do not use MyChart messaging for urgent concerns.    DIET:  We do recommend a small meal at first, but then you may proceed to your regular diet.  Drink plenty of fluids but you should avoid alcoholic beverages for 24 hours.  ACTIVITY:  You should plan to take it easy for the rest of today and you should NOT DRIVE or use heavy machinery until tomorrow  (because of the sedation medicines used during the test).    FOLLOW UP: Our staff will call the number listed on your records 48-72 hours following your procedure to check on you and address any questions or concerns that you may have regarding the information given to you following your procedure. If we do not reach you, we will leave a message.  We will attempt to reach you two times.  During this call, we will ask if you have developed any symptoms of COVID 19. If you develop any symptoms (ie: fever, flu-like symptoms, shortness of breath, cough etc.) before then, please call [phone].  If you test positive for Covid 19 in the 2 weeks post procedure, please call and report this information to [place].    If any biopsies were taken you will be contacted by phone or by letter within the next 1-3 weeks.  Please call us at [phone] if you have not heard about the biopsies in 3 weeks.    SIGNATURES/CONFIDENTIALITY: You and/or your care partner have signed paperwork which will be entered into your electronic medical record.  These signatures attest to the fact that that the information above on your After Visit Summary has been reviewed and is understood.  Full responsibility of the confidentiality of this discharge information lies with you and/or your care-partner.   Resume medications. Office will arrange blood draw .

## 2020-05-26 NOTE — Progress Notes (Signed)
Called to room to assist during endoscopic procedure.  Patient ID and intended procedure confirmed with present staff. Received instructions for my participation in the procedure from the performing physician.  

## 2020-05-26 NOTE — Progress Notes (Signed)
Vs in adm by CW  Pt's states no medical or surgical changes since previsit or office visit.  

## 2020-05-28 ENCOUNTER — Telehealth: Payer: Self-pay | Admitting: *Deleted

## 2020-05-28 NOTE — Telephone Encounter (Signed)
  Follow up Call-  Call back number 05/26/2020  Post procedure Call Back phone  # [phone]  Permission to leave phone message Yes  Some recent data might be hidden     Patient questions:  Do you have a fever, pain , or abdominal swelling? No. Pain Score  0 *  Have you tolerated food without any problems? Yes.    Have you been able to return to your normal activities? Yes.    Do you have any questions about your discharge instructions: Diet   No. Medications  No. Follow up visit  No.  Do you have questions or concerns about your Care? No.  Actions: * If pain score is 4 or above: 1. No action needed, pain <4.Have you developed a fever since your procedure? no  2.   Have you had an respiratory symptoms (SOB or cough) since your procedure? no  3.   Have you tested positive for COVID 19 since your procedure no  4.   Have you had any family members/close contacts diagnosed with the COVID 19 since your procedure?  no   If yes to any of these questions please route to Laverna Peace, RN and Karlton Lemon, RN

## 2020-06-02 ENCOUNTER — Telehealth: Payer: Self-pay

## 2020-06-02 NOTE — Telephone Encounter (Signed)
Called patient twice, vm is full, unable to leave a message at this time.   Need to discuss when she is fur for her next Entyvio infusion so that she can have labs 1-2 days prior.

## 2020-06-07 NOTE — Telephone Encounter (Signed)
Called patient, her vm is full, unable to leave a message.

## 2020-06-08 ENCOUNTER — Other Ambulatory Visit: Payer: Self-pay

## 2020-06-08 DIAGNOSIS — K529 Noninfective gastroenteritis and colitis, unspecified: Secondary | ICD-10-CM

## 2020-06-08 DIAGNOSIS — K523 Indeterminate colitis: Secondary | ICD-10-CM

## 2020-06-08 NOTE — Telephone Encounter (Signed)
Spoke with patient, she states that she will be due for her next Entyvio infusion on 07/09/20. She is aware that she will need to come in 1-2 days prior to her next infusion for lab work. Advised that I will call around that time to remind her. Patient verbalized understanding and had no concerns at the end of the call.  Lab order and reminder in epic.

## 2020-07-07 ENCOUNTER — Telehealth: Payer: Self-pay

## 2020-07-07 NOTE — Telephone Encounter (Signed)
-----   Message from Missy Sabins, RN sent at 06/08/2020  9:44 AM EDT ----- Regarding: Thompson Grayer Labs Entyvio trough and antibody levels on 5/18/ or 5/19. Orders in epic.

## 2020-07-07 NOTE — Telephone Encounter (Signed)
Spoke with patient to remind her that she is due for labs at this time. No appointment is necessary. Patient is aware that she can stop by the lab in the basement at her convenience today or tomorrow, prior to her next Entyvio infusion. Patient states that she will come in tomorrow morning. Patient verbalized understanding and had no concerns at the end of the call.

## 2020-07-08 ENCOUNTER — Other Ambulatory Visit: Payer: PRIVATE HEALTH INSURANCE

## 2020-07-08 DIAGNOSIS — K529 Noninfective gastroenteritis and colitis, unspecified: Secondary | ICD-10-CM

## 2020-07-08 DIAGNOSIS — K523 Indeterminate colitis: Secondary | ICD-10-CM

## 2020-07-18 LAB — SERIAL MONITORING

## 2020-07-20 LAB — VEDOLIZUMAB AND ANTI-VEDO AB
Anti-Vedolizumab Antibody: <25 ng/mL
Vedolizumab: 6.6 ug/mL

## 2020-07-23 ENCOUNTER — Telehealth: Payer: Self-pay | Admitting: Gastroenterology

## 2020-07-23 NOTE — Telephone Encounter (Signed)
The pt wanted to have a note stating that her Thompson Grayer was increased to every 4 weeks.  I have sent that to her My Chart.  The pt has been advised of the information and verbalized understanding.

## 2020-07-23 NOTE — Telephone Encounter (Signed)
Inbound call from patient requesting a call from a nurse please.  Has questions about Entyvio and getting a form for her job.  Please advise.

## 2020-09-01 ENCOUNTER — Telehealth: Payer: Self-pay | Admitting: *Deleted

## 2020-09-01 NOTE — Telephone Encounter (Signed)
Received call from patient.   Reports that she has been having some issues with head cold. Sx include productive cough, HA, nasal congestion.  Reports that home COVID test negative x2. Denies fever, SOB.   Advised to continue symptom management with OTC medications: Tylenol/ Ibuprofen for fever/ body aches, Mucinex/ Delsym for cough/ chest congestion, Afrin/Sudafed/nasal saline for sinus pressure/ nasal congestion.  Reports that she is taking Vedolizumab Thompson Grayer) for IBS and next administration is 09/10/2020. Inquired if she should re-schedule administration.   Advised that since Sx are improving, and she is not running a fever, administration does not need to be re-scheduled, but she should verify with specialist prior to appointment.

## 2020-09-29 ENCOUNTER — Telehealth: Payer: Self-pay | Admitting: Family Medicine

## 2020-09-29 NOTE — Telephone Encounter (Signed)
Patient tested negatively for covid last night but took another test today and it's positive. Patient seeking advice for managing symptoms: bad headache, fever, chills, coughing yesterday. Symptoms more mild today.   Please advise at [phone]

## 2020-09-30 MED ORDER — NIRMATRELVIR/RITONAVIR (PAXLOVID)TABLET: 3.0000 | ORAL_TABLET | Freq: Two times a day (BID) | ORAL | 0 refills | Status: AC

## 2020-09-30 NOTE — Telephone Encounter (Signed)
Patient tested positive for COVID on 09/29/2020.Theresa Farmer   Sx include cough, nasal congestion, HA, sweating, body aches. Reports that Sx are improving, but congestion has worsened today.  Advised to continue symptom management with OTC medications: Tylenol/ Ibuprofen for fever/ body aches, Mucinex/ Delsym for cough/ chest congestion, Afrin/Sudafed/nasal saline for sinus pressure/ nasal congestion  If chest pain, shortness of breath, fever >104 that is unresponsive to antipyretics noted, or if unable to tolerate fluids, advised to go to ER for evaluation.   Advised that the CDC recommends the following criteria prior to ending isolation in vaccinated persons:  at least 5 days since symptoms onset, and then an additional 5 days of masking,  AND 3 days fever free without antipyretics (Tylenol or Ibuprofen) AND improvement in respiratory symptoms.  Inquired if she is a candidate for Paxlovid as she is currently on Entyvio for IBD. Please advise.

## 2020-09-30 NOTE — Telephone Encounter (Signed)
Prescription sent to pharmacy. .   Call placed to patient and patient made aware.  

## 2020-10-18 ENCOUNTER — Other Ambulatory Visit: Payer: Self-pay

## 2020-10-18 ENCOUNTER — Ambulatory Visit (INDEPENDENT_AMBULATORY_CARE_PROVIDER_SITE_OTHER): Payer: PRIVATE HEALTH INSURANCE | Admitting: Family Medicine

## 2020-10-18 ENCOUNTER — Encounter: Payer: Self-pay | Admitting: Family Medicine

## 2020-10-18 VITALS — BP 120/80 | HR 78 | Temp 99.2°F | Resp 16 | Ht 65.5 in | Wt 228.0 lb

## 2020-10-18 DIAGNOSIS — R6882 Decreased libido: Secondary | ICD-10-CM

## 2020-10-18 DIAGNOSIS — K5900 Constipation, unspecified: Secondary | ICD-10-CM | POA: Diagnosis not present

## 2020-10-18 DIAGNOSIS — K5190 Ulcerative colitis, unspecified, without complications: Secondary | ICD-10-CM | POA: Insufficient documentation

## 2020-10-18 DIAGNOSIS — M7989 Other specified soft tissue disorders: Secondary | ICD-10-CM | POA: Diagnosis not present

## 2020-10-18 MED ORDER — FUROSEMIDE 40 MG PO TABS: 40.0000 mg | ORAL_TABLET | Freq: Every day | ORAL | 3 refills | Status: DC | PRN

## 2020-10-18 NOTE — Progress Notes (Signed)
Subjective:    Patient ID: Theresa Farmer, female    DOB: 1980-08-10, 40 y.o.   MRN: [id number]  HPI Patient is a very pleasant 40 year old African-American female who presents today complaining of leg swelling.  She is currently receiving Entyvio treatment for inflammatory bowel disease that was seen on a colonoscopy.  Biopsy showed chronic inactive colitis.  However she is also been diagnosed with IBS.  She states that since she started the [facility], while the diarrhea has resolved she is now constipated.  She is interested in safe options to help with constipation.  She has not tried MiraLAX.  She is also noticed since being on the [facility] that her legs are swelling more.  Today on examination she does have slight +1 pretibial edema.  She denies any shortness of breath or chest pain.  She denies any orthopnea.  She denies any history of sleep apnea.  She denies any hypersomnolence or witnessed apneic episodes.  There is no documented side effects of leg swelling on the [facility] information.  She also reports decreasing libido as well as irregular periods.  Her gynecologist tried her on different birth control pills but this has not helped.  She is concerned that she may have a thyroid issue.  She is also having some vaginal dryness which she thinks may be attributed to perimenopausal symptoms Past Medical History:  Diagnosis Date   Allergy    Endometriosis    Gastritis    H/O seasonal allergies    IBS (irritable bowel syndrome)    Ulcerative colitis (HCC)    Past Surgical History:  Procedure Laterality Date   COLONOSCOPY  11/2014, 03/2016   GANGLION CYST EXCISION Left 04/15/2018   Procedure: REMOVAL VOLAR GANGLION OF LEFT WRIST;  Surgeon: Betha Loa, MD;  Location: [facility];  Service: Orthopedics;  Laterality: Left;   TUBAL LIGATION     WISDOM TOOTH EXTRACTION     Current Outpatient Medications on File Prior to Visit  Medication Sig Dispense Refill   BIOTIN PO Take by  mouth.     cetirizine (ZYRTEC) 10 MG tablet Take 10 mg by mouth daily as needed for allergies.     Cholecalciferol (D-3-5) 5000 units capsule Take 2 capsules (10,000 Units total) by mouth daily.     ENTYVIO 300 MG injection Inject into the vein.     fluticasone (FLONASE) 50 MCG/ACT nasal spray Place 1 spray into both nostrils daily as needed for allergies. 48 g 2   LARISSIA 0.1-20 MG-MCG tablet Take 1 tablet by mouth daily.     vitamin C (ASCORBIC ACID) 500 MG tablet Take 1 tablet (500 mg total) by mouth daily.     No current facility-administered medications on file prior to visit.   Marland Kitchenall Social History   Socioeconomic History   Marital status: Married    Spouse name: Not on file   Number of children: 4   Years of education: Not on file   Highest education level: Not on file  Occupational History   Occupation: teacher  Tobacco Use   Smoking status: Never   Smokeless tobacco: Never  Vaping Use   Vaping Use: Never used  Substance and Sexual Activity   Alcohol use: Not Currently   Drug use: Yes    Types: Marijuana    Comment: occ   Sexual activity: Yes  Other Topics Concern   Not on file  Social History Narrative   Not on file   Social Determinants of Health  Financial Resource Strain: Not on file  Food Insecurity: Not on file  Transportation Needs: Not on file  Physical Activity: Not on file  Stress: Not on file  Social Connections: Not on file  Intimate Partner Violence: Not on file      Review of Systems  All other systems reviewed and are negative.     Objective:   Physical Exam Vitals reviewed.  Constitutional:      General: She is not in acute distress.    Appearance: Normal appearance. She is obese. She is not ill-appearing, toxic-appearing or diaphoretic.  Cardiovascular:     Rate and Rhythm: Normal rate and regular rhythm.     Heart sounds: Normal heart sounds. No murmur heard.   No friction rub. No gallop.  Pulmonary:     Effort: Pulmonary  effort is normal. No respiratory distress.     Breath sounds: Normal breath sounds. No wheezing, rhonchi or rales.  Abdominal:     General: Abdomen is flat. Bowel sounds are normal.     Palpations: Abdomen is soft.  Musculoskeletal:     Right lower leg: No edema.     Left lower leg: No edema.  Neurological:     Mental Status: She is alert.          Assessment & Plan:  Leg swelling - Plan: TSH, CBC with Differential/Platelet, COMPLETE METABOLIC PANEL WITH GFR  Decreased libido  Constipation, unspecified constipation type I will be glad to check a CBC CMP and a TSH.  I do not believe the leg swelling is likely due to [facility].  If her lab work is normal she can try Lasix 40 mg p.o. daily as needed leg swelling.  I will certainly be glad to check a TSH to ensure that hypothyroidism is not a cause of her vaginal dryness, decreasing libido, or constipation.

## 2020-10-19 ENCOUNTER — Telehealth: Payer: Self-pay | Admitting: Family Medicine

## 2020-10-19 LAB — CBC WITH DIFFERENTIAL/PLATELET
Absolute Monocytes: 840 cells/uL (ref 200–950)
Basophils Absolute: 22 cells/uL (ref 0–200)
Basophils Relative: 0.2 %
Eosinophils Absolute: 101 cells/uL (ref 15–500)
Eosinophils Relative: 0.9 %
HCT: 36.9 % (ref 35.0–45.0)
Hemoglobin: 12.3 g/dL (ref 11.7–15.5)
Lymphs Abs: 3237 cells/uL (ref 850–3900)
MCH: 26.7 pg — ABNORMAL LOW (ref 27.0–33.0)
MCHC: 33.3 g/dL (ref 32.0–36.0)
MCV: 80.2 fL (ref 80.0–100.0)
MPV: 10.1 fL (ref 7.5–12.5)
Monocytes Relative: 7.5 %
Neutro Abs: 7000 cells/uL (ref 1500–7800)
Neutrophils Relative %: 62.5 %
Platelets: 329 10*3/uL (ref 140–400)
RBC: 4.6 10*6/uL (ref 3.80–5.10)
RDW: 12.1 % (ref 11.0–15.0)
Total Lymphocyte: 28.9 %
WBC: 11.2 10*3/uL — ABNORMAL HIGH (ref 3.8–10.8)

## 2020-10-19 LAB — COMPLETE METABOLIC PANEL WITH GFR
AG Ratio: 1.2 (calc) (ref 1.0–2.5)
ALT: 15 U/L (ref 6–29)
AST: 15 U/L (ref 10–30)
Albumin: 3.6 g/dL (ref 3.6–5.1)
Alkaline phosphatase (APISO): 63 U/L (ref 31–125)
BUN/Creatinine Ratio: 10 (calc) (ref 6–22)
BUN: 11 mg/dL (ref 7–25)
CO2: 27 mmol/L (ref 20–32)
Calcium: 8.9 mg/dL (ref 8.6–10.2)
Chloride: 107 mmol/L (ref 98–110)
Creat: 1.09 mg/dL — ABNORMAL HIGH (ref 0.50–0.97)
Globulin: 3.1 g/dL (calc) (ref 1.9–3.7)
Glucose, Bld: 83 mg/dL (ref 65–99)
Potassium: 3.8 mmol/L (ref 3.5–5.3)
Sodium: 140 mmol/L (ref 135–146)
Total Bilirubin: 0.3 mg/dL (ref 0.2–1.2)
Total Protein: 6.7 g/dL (ref 6.1–8.1)
eGFR: 66 mL/min/{1.73_m2} (ref >=60)

## 2020-10-19 LAB — TSH: TSH: 1.82 mIU/L

## 2020-10-19 NOTE — Telephone Encounter (Signed)
Please see lab results for further documentation.  

## 2020-10-19 NOTE — Telephone Encounter (Signed)
Patient called to follow up on labs drawn 8/29; wants to know if she should continue with medication. Please advise at [phone].

## 2020-11-08 ENCOUNTER — Telehealth: Payer: Self-pay

## 2020-11-08 NOTE — Telephone Encounter (Signed)
-----   Message from Missy Sabins, RN sent at 06/08/2020  9:44 AM EDT ----- Regarding: Follow up 6 month follow up - colitis, needs an appt in 11/2020

## 2020-11-08 NOTE — Telephone Encounter (Signed)
Patient has been scheduled for a 93-month follow up with Dr. Adela Lank on Tuesday, 11/30/20 at 3:20 PM. My chart message sent to patient with appt information.

## 2020-11-22 NOTE — Telephone Encounter (Signed)
Received a notification that patient had no read my chart message regarding her 63-month follow up. Spoke with patient and she is aware of appt.

## 2020-11-30 ENCOUNTER — Ambulatory Visit (INDEPENDENT_AMBULATORY_CARE_PROVIDER_SITE_OTHER): Payer: No Typology Code available for payment source | Admitting: Gastroenterology

## 2020-11-30 ENCOUNTER — Other Ambulatory Visit: Payer: Self-pay | Admitting: *Deleted

## 2020-11-30 ENCOUNTER — Encounter: Payer: Self-pay | Admitting: Gastroenterology

## 2020-11-30 VITALS — BP 144/90 | HR 87 | Ht 65.5 in | Wt 228.5 lb

## 2020-11-30 DIAGNOSIS — K219 Gastro-esophageal reflux disease without esophagitis: Secondary | ICD-10-CM | POA: Diagnosis not present

## 2020-11-30 DIAGNOSIS — K523 Indeterminate colitis: Secondary | ICD-10-CM | POA: Diagnosis not present

## 2020-11-30 DIAGNOSIS — K5900 Constipation, unspecified: Secondary | ICD-10-CM | POA: Diagnosis not present

## 2020-11-30 MED ORDER — POLYETHYLENE GLYCOL 3350 17 G PO PACK: 17.0000 g | PACK | Freq: Every day | ORAL | 0 refills | Status: AC

## 2020-11-30 MED ORDER — OMEPRAZOLE 40 MG PO CPDR: 40.0000 mg | DELAYED_RELEASE_CAPSULE | Freq: Every day | ORAL | 1 refills | Status: DC

## 2020-11-30 NOTE — Progress Notes (Signed)
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 Inflammation most in right and transverse colon, very mild in left colon. Had paradoxical worsening on Lialda. 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 Eventually placed on Humira and had some initial improvement but had some elevated inflammatory markers which led to testing drug levels. She had subtherapeutic Humira levels (2.4) with low level AB present (26). Inflammatory markers were positive. She was started on methotrexate 15mg  po q week in May 2017 along with folic acid 1mg . Humira was changed to 40mg  weekly at that time. Her antibodies to Humira went away and her Humira level went from 2.4 to 5.7. Follow up colonoscopy in 2018 showed mucosal healing. Patient self-stopped all therapy in 2018 as she was feeling well, haven't seen her since that time.  She represented in 09/2019 on no medical therapy, she had fear of needles and declined Humira.  Placed on steroids and then transition to [facility] Sept 2021.   SINCE LAST VISIT   40 year old female here for follow-up visit.  Recall since her last visit she had a colonoscopy in April.  This showed some patchy mild inflammation of the right colon but otherwise the rest of her exam looked good.  Checked Entyvio levels in May, which were subtherapeutic with a value of 6.6 without antibodies detected.  Based on this result I increased her Entyvio to every 4-week dosing.  She has been on that now since May.  She states she has been feeling pretty well without problems with her bowels.  No blood in her stools.  No diarrhea.  In fact she been constipated having a bowel movement once every 3 to 5 days or so.  Has not been taking much for that recently, was told to try taking some MiraLAX.   Generally she has been doing pretty well.  One of the main symptom she has been experiencing lately has been reflux.  She has some regurgitation that bothers her, sleeping  with head of the bed elevated, having some nocturnal symptoms.  Also has some globus and belching with this.  Denies much pyrosis.  She has been using apple cider vinegar as needed which helps but does not prevent symptoms.  She denies any dysphagia with this.  She has had decreased appetite in light of the symptoms, no weight loss.  She has been on Nexium and/or Prevacid remotely she thinks but has been sometime.  She is currently not taking any antacids.     Colonoscopy 04/07/2016 - mild ileal inflammation, small cecal patch of inflammation - otherwise normal colon - all biopsies returned as normal, in remission   Colonoscopy 05/26/20 - The perianal and digital rectal examinations were normal. - The terminal ileum appeared normal. - Patchy mild inflammation characterized by small patchy areas of erosions, erythema and granularity was found in the ascending colon and in the cecum. Overall severity is mild - Internal hemorrhoids were found during retroflexion. The hemorrhoids were small. - The exam was otherwise without abnormality. No inflammation of the rest of the colon appreciated. - Biopsies were taken with a cold forceps from the right colon, left colon, transverse colon and rectum. These biopsy specimens were sent to Pathology.  Diagnosis 1. Surgical [P], right colon - INACTIVE CHRONIC NONSPECIFIC COLITIS CHARACTERIZED BY MILD ARCHITECTURAL CHANGES. SEE NOTE - NEGATIVE FOR GRANULOMAS OR DYSPLASIA 2. Surgical [P], transverse colon - COLONIC MUCOSA WITH NO SPECIFIC HISTOPATHOLOGIC CHANGES - NEGATIVE FOR ACUTE INFLAMMATION, FEATURES OF CHRONICITY,  GRANULOMAS OR DYSPLASIA 3. Surgical [P], left colon - COLONIC MUCOSA WITHIN NORMAL LIMITS; HOWEVER, WELL-HEALED CHRONIC COLITIS CANNOT BE RULED OUT - NEGATIVE FOR GRANULOMAS OR DYSPLASIA  Entyvio levels 07/08/20 - level of 6.6, AB undetectable    Past Medical History:  Diagnosis Date   Allergy    Endometriosis    Gastritis    H/O  seasonal allergies    IBS (irritable bowel syndrome)    Ulcerative colitis (HCC)      Past Surgical History:  Procedure Laterality Date   COLONOSCOPY  11/2014, 03/2016   GANGLION CYST EXCISION Left 04/15/2018   Procedure: REMOVAL VOLAR GANGLION OF LEFT WRIST;  Surgeon: Betha Loa, MD;  Location: [facility];  Service: Orthopedics;  Laterality: Left;   TUBAL LIGATION     WISDOM TOOTH EXTRACTION     Family History  Problem Relation Age of Onset   Colon cancer Other        cousins and uncles   Breast cancer Maternal Grandmother    Diabetes Other        several maternal relatives   Hypertension Other        several paternal and maternal relatives   Irritable bowel syndrome Other        mother   Immunodeficiency Father    Bullous pemphigoid Father    Rheum arthritis Mother    Lupus Sister    Colon polyps Neg Hx    Esophageal cancer Neg Hx    Rectal cancer Neg Hx    Stomach cancer Neg Hx    Social History   Tobacco Use   Smoking status: Never   Smokeless tobacco: Never  Vaping Use   Vaping Use: Never used  Substance Use Topics   Alcohol use: Not Currently   Drug use: Yes    Types: Marijuana    Comment: occ   Current Outpatient Medications  Medication Sig Dispense Refill   BIOTIN PO Take by mouth.     cetirizine (ZYRTEC) 10 MG tablet Take 10 mg by mouth daily as needed for allergies.     Cholecalciferol (D-3-5) 5000 units capsule Take 2 capsules (10,000 Units total) by mouth daily.     ENTYVIO 300 MG injection Inject into the vein.     fluticasone (FLONASE) 50 MCG/ACT nasal spray Place 1 spray into both nostrils daily as needed for allergies. 48 g 2   furosemide (LASIX) 40 MG tablet Take 1 tablet (40 mg total) by mouth daily as needed. 30 tablet 3   TRI-ESTARYLLA 0.18/0.215/0.25 MG-35 MCG tablet Take 1 tablet by mouth daily.     vitamin C (ASCORBIC ACID) 500 MG tablet Take 1 tablet (500 mg total) by mouth daily.     No current facility-administered  medications for this visit.   Allergies  Allergen Reactions   Keflex [Cephalexin]     hives     Review of Systems: All systems reviewed and negative except where noted in HPI.   Lab Results  Component Value Date   WBC 11.2 (H) 10/18/2020   HGB 12.3 10/18/2020   HCT 36.9 10/18/2020   MCV 80.2 10/18/2020   PLT 329 10/18/2020   Lab Results  Component Value Date   CREATININE 1.09 (H) 10/18/2020   BUN 11 10/18/2020   NA 140 10/18/2020   K 3.8 10/18/2020   CL 107 10/18/2020   CO2 27 10/18/2020    Lab Results  Component Value Date   ALT 15 10/18/2020   AST 15 10/18/2020  ALKPHOS 62 10/06/2016   BILITOT 0.3 10/18/2020     Physical Exam: BP (!) 144/90   Pulse 87   Ht 5' 5.5" (1.664 m)   Wt 228 lb 8 oz (103.6 kg)   BMI 37.45 kg/m  Constitutional: Pleasant,well-developed, female in no acute distress. Neurological: Alert and oriented to person place and time. Psychiatric: Normal mood and affect. Behavior is normal.   ASSESSMENT AND PLAN: 39 year old female here for reassessment of following:  Indeterminate colitis Constipation GERD  As above, subtherapeutic Entyvio levels on every 8 week dosing with mildly active right-sided colitis.  Generally she has felt clinically quite well since being on Entyvio she has had no flares of symptoms.  She is now on every 4-week dosing of Entyvio and continues to feel well.  No diarrhea, in fact having some constipation.  We discussed options.  Recommend starting MiraLAX daily and titrate up or down as needed to treat the constipation.  She had labs done in August which are normal/stable.  I like to check a fecal calprotectin to ensure okay while on higher dosing of Entyvio.  Assuming this looks good and she continues to feel well we will continue her present regimen.  We discussed her reflux symptoms and options to treat this.  Recommend trial of omeprazole 40 mg/day for 30 days and can then use as needed moving forward.  If no  improvement on this regimen she should contact me.  Consider EGD if symptoms persist despite PPI.  Plan: - continue Entyvio q 4 weeks - fecal calprotectin - start MIralax daily and titrate as needed - start Omeprazole 40mg  /d ay for 30 days, then use PRN. If no improvement she should contact me - f/u 6 months  , MD [facility]

## 2020-11-30 NOTE — Patient Instructions (Addendum)
If you are age 39 or older, your body mass index should be between 23-30. Your Body mass index is 37.45 kg/m. If this is out of the aforementioned range listed, please consider follow up with your Primary Care Provider.  If you are age 67 or younger, your body mass index should be between 19-25. Your Body mass index is 37.45 kg/m. If this is out of the aformentioned range listed, please consider follow up with your Primary Care Provider.   __________________________________________________________  The [facility] providers would like to encourage you to use [facility] to communicate with providers for non-urgent requests or questions.  Due to long hold times on the telephone, sending your provider a message by [facility] may be a faster and more efficient way to get a response.  Please allow 48 business hours for a response.  Please remember that this is for non-urgent requests.   Continue Entyvio  Take Miralax : Take one daily and titrate as needed  We have sent the following medications to your pharmacy for you to pick up at your convenience: Omeprazole 40 mg: Take once daily  Please go to the lab in the basement of our building to have lab work done as you leave today. Hit "B" for basement when you get on the elevator.  When the doors open the lab is on your left.  We will call you with the results. Thank you.   You will be due for an Office Visit in April 2023. We will send you a reminder in the mail when it gets closer to that time.  Thank you for entrusting me with your care and for choosing [facility], Dr. Ileene Patrick

## 2020-12-15 ENCOUNTER — Other Ambulatory Visit: Payer: No Typology Code available for payment source

## 2020-12-15 DIAGNOSIS — K523 Indeterminate colitis: Secondary | ICD-10-CM

## 2020-12-15 DIAGNOSIS — K219 Gastro-esophageal reflux disease without esophagitis: Secondary | ICD-10-CM

## 2020-12-15 DIAGNOSIS — K5900 Constipation, unspecified: Secondary | ICD-10-CM

## 2020-12-22 LAB — CALPROTECTIN, FECAL: Calprotectin, Fecal: 486 ug/g — ABNORMAL HIGH (ref 0–120)

## 2020-12-28 ENCOUNTER — Other Ambulatory Visit: Payer: Self-pay | Admitting: Gastroenterology

## 2021-01-10 ENCOUNTER — Telehealth: Payer: Self-pay | Admitting: Gastroenterology

## 2021-01-10 NOTE — Telephone Encounter (Signed)
Inbound call from pt requesting a call back stating that she is getting her infusions late at [facility] and her levels are high when she gets them late. Please advise. Thank you.

## 2021-01-11 NOTE — Telephone Encounter (Signed)
Called and spoke with patient. She reports that she has been keeping her appts with [facility] for her Entyvio every 4 weeks. Pt reports that this time she did not receive a notification that her medication had been shipped/ordered. Pt reports that the medication was not ordered by [facility] in time. She states that [facility] called her today and told her that the medication is expected at their office tomorrow afternoon. Pt is scheduled for her infusion on 01/12/21 at 3 pm. I called and spoke with Corrie Dandy at the [facility] infusion suite. She reports that she is not sure what happened with patient's Entyvio order this time. She reports that the order should be placed 1 week after her infusion to ensure they get it in time. The next order will be placed on 01/19/21. I called pt back and informed her of the information I received. Pt wanted you to be aware that she is being compliant, but had some issues with [facility] with timing of her Entyvio infusions. Advised pt to keep follow up as scheduled. Pt denies any symptoms at this time. Pt had no concerns at the end of the call.

## 2021-01-11 NOTE — Telephone Encounter (Signed)
Called and spoke with patient in regards to information. She is thankful to have other options. She states that if [facility] gets too bad then she will think about changing. Pt had no concerns at the end of the call.

## 2021-01-11 NOTE — Telephone Encounter (Signed)
Thanks very much for working on this and the update. Moving forward if [facility] can't reliably schedule her when she is supposed to have it done can look at utilizing the [facility] infusion center if she would want to switch to another infusion center. Thanks

## 2021-02-18 ENCOUNTER — Other Ambulatory Visit: Payer: Self-pay | Admitting: Family Medicine

## 2021-03-03 ENCOUNTER — Ambulatory Visit: Payer: No Typology Code available for payment source | Admitting: Gastroenterology

## 2021-04-01 ENCOUNTER — Ambulatory Visit: Payer: 59 | Admitting: Gastroenterology

## 2021-04-01 ENCOUNTER — Telehealth: Payer: Self-pay | Admitting: Pharmacy Technician

## 2021-04-01 ENCOUNTER — Encounter: Payer: Self-pay | Admitting: Gastroenterology

## 2021-04-01 VITALS — BP 134/80 | HR 66 | Ht 66.0 in | Wt 227.0 lb

## 2021-04-01 DIAGNOSIS — K219 Gastro-esophageal reflux disease without esophagitis: Secondary | ICD-10-CM | POA: Diagnosis not present

## 2021-04-01 DIAGNOSIS — K5900 Constipation, unspecified: Secondary | ICD-10-CM | POA: Diagnosis not present

## 2021-04-01 DIAGNOSIS — K523 Indeterminate colitis: Secondary | ICD-10-CM

## 2021-04-01 MED ORDER — SUTAB 1479-225-188 MG PO TABS: 1.0000 | ORAL_TABLET | Freq: Once | ORAL | 0 refills | Status: AC

## 2021-04-01 NOTE — Progress Notes (Signed)
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 Inflammation most in right and transverse colon, very mild in left colon. Had paradoxical worsening on Lialda. 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 Eventually placed on Humira and had some initial improvement but had some elevated inflammatory markers which led to testing drug levels. She had subtherapeutic Humira levels (2.4) with low level AB present (26). Inflammatory markers were positive. She was started on methotrexate $RemoveBeforeD'15mg'YjnPlbBYbHBpnI$  po q week in May 9518 along with folic acid $RemoveBe'1mg'bvqJyeVMz$ . Humira was changed to $RemoveBe'40mg'ZICLzmwsz$  weekly at that time. Her antibodies to Humira went away and her Humira level went from 2.4 to 5.7. Follow up colonoscopy in 2018 showed mucosal healing. Patient self-stopped all therapy in 2018 as she was feeling well, haven't seen her since that time.  She represented in 09/2019 on no medical therapy, she had fear of needles and declined Humira.  Placed on steroids and then transition to [facility] Sept 2021.   SINCE LAST VISIT   41 year old female here for follow-up visit for her colitis.  I last saw her in October 2022.  She previously had a colonoscopy in April 2022 showing some patchy inflammation of the right colon that was mild.  We checked Entyvio levels in May, which were subtherapeutic with a value of 6.6 without antibodies detected.  Based on this result we increased her Entyvio to every 4-week dosing.  She has been on that now since May.  She has been feeling pretty well, checked her fecal calprotectin in October however that was elevated to 436.  She states however she was feeling really well and denies any colitis symptoms at all.  Normally if she has a flare of her symptoms she will have increased frequency of urgent stools with bleeding and abdominal pain.  She has not had any of that, in fact she has been constipated at times, using MiraLAX as needed.  No flares since have seen her.  She  is feeling quite well.  She is using omeprazole as needed for reflux.  She has changed her insurance a few days ago and is wondering where she should get her Weyman Rodney moving forward, she is scheduled to have it done at [facility] in the next week or 2 but she is not sure how much it will be with her new insurance through [facility] and wonders if she needs to have it done at the [facility] infusion center.  Prior workup:  Colonoscopy 04/07/2016 - mild ileal inflammation, small cecal patch of inflammation - otherwise normal colon - all biopsies returned as normal, in remission   Colonoscopy 05/26/20 - The perianal and digital rectal examinations were normal. - The terminal ileum appeared normal. - Patchy mild inflammation characterized by small patchy areas of erosions, erythema and granularity was found in the ascending colon and in the cecum. Overall severity is mild - Internal hemorrhoids were found during retroflexion. The hemorrhoids were small. - The exam was otherwise without abnormality. No inflammation of the rest of the colon appreciated. - Biopsies were taken with a cold forceps from the right colon, left colon, transverse colon and rectum. These biopsy specimens were sent to Pathology.   1. Surgical [P], right colon - INACTIVE CHRONIC NONSPECIFIC COLITIS CHARACTERIZED BY MILD ARCHITECTURAL CHANGES. SEE NOTE - NEGATIVE FOR GRANULOMAS OR DYSPLASIA 2. Surgical [P], transverse colon - COLONIC MUCOSA WITH NO SPECIFIC HISTOPATHOLOGIC CHANGES - NEGATIVE FOR ACUTE INFLAMMATION, FEATURES OF CHRONICITY, GRANULOMAS OR DYSPLASIA 3. Surgical [P], left  colon - COLONIC MUCOSA WITHIN NORMAL LIMITS; HOWEVER, WELL-HEALED CHRONIC COLITIS CANNOT BE RULED OUT - NEGATIVE FOR GRANULOMAS OR DYSPLASIA   Entyvio levels 07/08/20 - level of 6.6, AB undetectable   Fecal calprotectin of 486 on 12/15/20    Past Medical History:  Diagnosis Date   Allergy    Endometriosis    Gastritis    H/O seasonal allergies    IBS  (irritable bowel syndrome)    Ulcerative colitis ([place])      Past Surgical History:  Procedure Laterality Date   COLONOSCOPY  11/2014, 03/2016   GANGLION CYST EXCISION Left 04/15/2018   Procedure: REMOVAL VOLAR GANGLION OF LEFT WRIST;  Surgeon: Leanora Cover, MD;  Location: [facility];  Service: Orthopedics;  Laterality: Left;   TUBAL LIGATION     WISDOM TOOTH EXTRACTION     Family History  Problem Relation Age of Onset   Rheum arthritis Mother    Immunodeficiency Father    Bullous pemphigoid Father    Lupus Sister    Breast cancer Maternal Grandmother    Colon cancer Other        cousins and uncles   Diabetes Other        several maternal relatives   Hypertension Other        several paternal and maternal relatives   Irritable bowel syndrome Other        mother   Colon polyps Neg Hx    Esophageal cancer Neg Hx    Rectal cancer Neg Hx    Stomach cancer Neg Hx    Social History   Tobacco Use   Smoking status: Never   Smokeless tobacco: Never  Vaping Use   Vaping Use: Never used  Substance Use Topics   Alcohol use: Not Currently   Drug use: Yes    Types: Marijuana    Comment: occ   Current Outpatient Medications  Medication Sig Dispense Refill   BIOTIN PO Take by mouth.     cetirizine (ZYRTEC) 10 MG tablet Take 10 mg by mouth daily as needed for allergies.     Cholecalciferol (D-3-5) 5000 units capsule Take 2 capsules (10,000 Units total) by mouth daily.     ENTYVIO 300 MG injection Inject into the vein.     fluticasone (FLONASE) 50 MCG/ACT nasal spray Place 1 spray into both nostrils daily as needed for allergies. 48 g 2   furosemide (LASIX) 40 MG tablet TAKE 1 TABLET BY MOUTH DAILY AS NEEDED 30 tablet 3   omeprazole (PRILOSEC) 40 MG capsule TAKE 1 CAPSULE (40 MG TOTAL) BY MOUTH DAILY. 90 capsule 1   polyethylene glycol (MIRALAX) 17 g packet Take 17 g by mouth daily. Titrate as needed 14 each 0   vitamin C (ASCORBIC ACID) 500 MG tablet Take 1 tablet  (500 mg total) by mouth daily.     No current facility-administered medications for this visit.   Allergies  Allergen Reactions   Keflex [Cephalexin]     hives     Review of Systems: All systems reviewed and negative except where noted in HPI.   Lab Results  Component Value Date   WBC 11.2 (H) 10/18/2020   HGB 12.3 10/18/2020   HCT 36.9 10/18/2020   MCV 80.2 10/18/2020   PLT 329 10/18/2020   Lab Results  Component Value Date   CREATININE 1.09 (H) 10/18/2020   BUN 11 10/18/2020   NA 140 10/18/2020   K 3.8 10/18/2020   CL 107 10/18/2020  CO2 27 10/18/2020    Lab Results  Component Value Date   ALT 15 10/18/2020   AST 15 10/18/2020   ALKPHOS 62 10/06/2016   BILITOT 0.3 10/18/2020     Physical Exam: BP 134/80    Pulse 66    Ht $R'5\' 6"'eq$  (1.676 m)    Wt 227 lb (103 kg)    BMI 36.64 kg/m  Constitutional: Pleasant,well-developed, female in no acute distress. Neurological: Alert and oriented to person place and time. Psychiatric: Normal mood and affect. Behavior is normal.   ASSESSMENT AND PLAN: 41 year old female here for reassessment of the following:  Indeterminate colitis Constipation GERD  As above, on Entyvio every 8-week dosing with mildly active right-sided colitis on colonoscopy last year.  Her Entyvio levels were subtherapeutic, we increased her dosing to every 4 weeks.  She clinically has felt much better and well without flares but her fecal calprotectin was quite elevated in the 400s.  She wants to continue present management as she is been feeling well.  I do think she warrants a surveillance colonoscopy at this point to assess for mucosal healing on high-dose Entyvio especially with her elevated fecal calprotectin in recent months.  She is agreeable to proceed after discussion of risks and benefits of the exam and anesthesia.  She is due for blood work with [facility] gold, CBC, c-Met, vitamin D, will send for that.  I will otherwise reach out and send a  request for infusion to [facility] infusion [facility] to see if this may be cheaper for her to have Entyvio done through [facility] now that her insurance has changed.  I also asked her to contact [facility] so they are aware of her new insurance so they can give her cost estimate as well as we do not want her dosing of Entyvio to lapse.  She agrees with the plan and will let me know if any questions in the interim, otherwise await her colonoscopy result.  Plan: - continue Entyvio as currently dosed - schedule for surveillance colonoscopy - CBC, CMET, quantiferon gold, vitamin D levels - will send a referral to [facility] infusion center to see if Weyman Rodney will covered at a cheaper rate for her there in light of insurance change  Jolly Mango, MD [facility]

## 2021-04-01 NOTE — Telephone Encounter (Signed)
Theresa Farmer, Patient had an appt today with Dr. Adela Lank. I received a referral for entyvio and was trying to verify coverage. By chance, did the patient leave new insurance info with the clinic? (Current card on file has termed 03/23/21)

## 2021-04-01 NOTE — Patient Instructions (Addendum)
If you are age 40 or older, your body mass index should be between 23-30. Your Body mass index is 36.64 kg/m. If this is out of the aforementioned range listed, please consider follow up with your Primary Care Provider.  If you are age 11 or younger, your body mass index should be between 19-25. Your Body mass index is 36.64 kg/m. If this is out of the aformentioned range listed, please consider follow up with your Primary Care Provider.   ________________________________________________________  The [facility] providers would like to encourage you to use [facility] to communicate with providers for non-urgent requests or questions.  Due to long hold times on the telephone, sending your provider a message by [facility] may be a faster and more efficient way to get a response.  Please allow 48 business hours for a response.  Please remember that this is for non-urgent requests.  _______________________________________________________   Bonita Quin have been scheduled for a colonoscopy. Please follow written instructions given to you at your visit today.  Please pick up your prep supplies at the pharmacy within the next 1-3 days. If you use inhalers (even only as needed), please bring them with you on the day of your procedure.  Please go to the lab in the basement of our building to have lab work done as you leave today. Hit "B" for basement when you get on the elevator.  When the doors open the lab is on your left.  We will call you with the results. Thank you.  Thank you for entrusting me with your care and for choosing [facility], Dr. Ileene Patrick

## 2021-04-01 NOTE — Telephone Encounter (Signed)
Called and spoke with patient. She reports that she now has [employer], her husband is the Agricultural engineer. She states that she does not have her own card but gave me the information on his card. She states that they have been having issues with pulling up her information. She did not leave a copy at our office today. I hope this helps. Thanks  Member ID: [id number] Group # [id number] RxGrp: [id number] RxBIN: [id number] RxPCN: [id number]

## 2021-04-04 NOTE — Telephone Encounter (Signed)
Thanks.Marland KitchenMarland Kitchenappreciate your help.  I will try to begin BIV and Pa process.

## 2021-04-04 NOTE — Telephone Encounter (Addendum)
Auth Submission: Pending Payer: [employer] Medication & CPT/J Code(s) submitted: Entyvio (Vedolizumab) [id number] Route of submission (phone, fax, portal): PORTAL Phone: [phone] Auth type: Buy/Bill Units/visits requested: [place] Reference number: CERT# [id number] CASE ID# [id number] [facility] ID# [id number] GR# [id number]  Will update once we receive a response.

## 2021-04-19 NOTE — Telephone Encounter (Signed)
[place], I have faxed patient assistance forms to fax [phone]. Please have Dr. Adela Lank sign then as soon as possible and return to me. Selena Batten

## 2021-04-19 NOTE — Telephone Encounter (Signed)
Spoke with patient and she is already enrolled in co-pay assistance program.  ID# [id number] GR# [id number] BIN# [id number] PCN# [id number]  Patient will be scheduled as soon as possible.

## 2021-04-19 NOTE — Telephone Encounter (Signed)
Patient has been enrolled in [place] co-pay card. Awaiting resopnse

## 2021-04-19 NOTE — Telephone Encounter (Signed)
Auth Submission: approved Payer: [employer] Medication & CPT/J Code(s) submitted: Entyvio (Vedolizumab) [id number] Route of submission (phone, fax, portal): PORTAL Auth type: Buy/Bill Units/visits requested: 300MG  Q4WKS Reference number: [id number] Approval from: 04/11/21 to 04/10/22 Patient will be scheduled as soon as possible.

## 2021-05-06 ENCOUNTER — Other Ambulatory Visit: Payer: Self-pay

## 2021-05-06 ENCOUNTER — Ambulatory Visit (INDEPENDENT_AMBULATORY_CARE_PROVIDER_SITE_OTHER): Payer: 59

## 2021-05-06 VITALS — BP 147/80 | HR 69 | Temp 98.5°F | Resp 18 | Ht 66.0 in | Wt 230.8 lb

## 2021-05-06 DIAGNOSIS — K523 Indeterminate colitis: Secondary | ICD-10-CM | POA: Diagnosis not present

## 2021-05-06 MED ORDER — VEDOLIZUMAB 300 MG IV SOLR
300.0000 mg | INTRAVENOUS | Status: DC
  Administered 2021-05-06: 300 mg via INTRAVENOUS
  Filled 2021-05-06: qty 5

## 2021-05-06 NOTE — Progress Notes (Signed)
Diagnosis: Indeterminate colitis ? ?Provider:  Chilton Greathouse, MD ? ?Procedure: Infusion ? ?IV Type: Peripheral, IV Location: L Antecubital ? ?Entyvio (Vedolizumab), Dose: 300 mg ? ?Infusion Start Time: 0907 ? ?Infusion Stop Time: 0940 ? ?Post Infusion IV Care: Patient declined observation and Peripheral IV Discontinued ? ?Discharge: Condition: Good, Destination: Home . AVS provided to patient.  ? ?Performed by:  Adriana Mccallum, RN  ?  ?

## 2021-05-09 ENCOUNTER — Other Ambulatory Visit (HOSPITAL_COMMUNITY): Payer: Self-pay

## 2021-05-09 ENCOUNTER — Encounter: Payer: Self-pay | Admitting: Gastroenterology

## 2021-05-09 MED ORDER — ENTYVIO 300 MG IV SOLR
INTRAVENOUS | 12 refills | Status: DC
Start: 2021-05-09 — End: 2021-05-13
  Filled 2021-05-09: qty 5, 28d supply, fill #0

## 2021-05-10 ENCOUNTER — Other Ambulatory Visit (HOSPITAL_COMMUNITY): Payer: Self-pay

## 2021-05-11 ENCOUNTER — Other Ambulatory Visit (HOSPITAL_COMMUNITY): Payer: Self-pay

## 2021-05-12 ENCOUNTER — Other Ambulatory Visit (HOSPITAL_COMMUNITY): Payer: Self-pay

## 2021-05-13 ENCOUNTER — Other Ambulatory Visit (HOSPITAL_COMMUNITY): Payer: Self-pay

## 2021-05-13 ENCOUNTER — Ambulatory Visit: Payer: 59 | Attending: Family Medicine | Admitting: Pharmacist

## 2021-05-13 ENCOUNTER — Other Ambulatory Visit: Payer: Self-pay

## 2021-05-13 DIAGNOSIS — Z79899 Other long term (current) drug therapy: Secondary | ICD-10-CM

## 2021-05-13 MED ORDER — ENTYVIO 300 MG IV SOLR
INTRAVENOUS | 12 refills | Status: DC
  Filled 2021-05-13: qty 5, 28d supply, fill #0

## 2021-05-13 NOTE — Progress Notes (Signed)
?  S: ?Patient presents today for review of their specialty medication.  ?  ?Patient is currently taking Entyvio for IBD/indeterminate colitis. Patient is managed by Dr. Havery Moros for this. She has been on Entyvio now for ~1 year.  ?  ?Dosing: 300 mg subq q8wks ?  ?Adherence: confirmed  ?  ?Efficacy: reports good symptom control. Has a colonoscopy coming up in April.  ?  ?Monitoring: ?-Hypersensitivity: none reported  ?-S/sx of infection: none reported ?-S/sx of PML: none reported  ?-S/sx of liver injury: none  ?  ?Current adverse effects: ?-None reported ? ?O: ?   ? ?Lab Results  ?Component Value Date  ? WBC 11.2 (H) 10/18/2020  ? HGB 12.3 10/18/2020  ? HCT 36.9 10/18/2020  ? MCV 80.2 10/18/2020  ? PLT 329 10/18/2020  ? ? ?  Chemistry   ?   ?Component Value Date/Time  ? NA 140 10/18/2020 1635  ? K 3.8 10/18/2020 1635  ? CL 107 10/18/2020 1635  ? CO2 27 10/18/2020 1635  ? BUN 11 10/18/2020 1635  ? CREATININE 1.09 (H) 10/18/2020 1635  ?    ?Component Value Date/Time  ? CALCIUM 8.9 10/18/2020 1635  ? ALKPHOS 62 10/06/2016 1109  ? AST 15 10/18/2020 1635  ? ALT 15 10/18/2020 1635  ? BILITOT 0.3 10/18/2020 1635  ?  ? ? ? ?A/P: ?1. Medication review: patient currently on Entyvio for indeterminate colitis with good control. She denies any side effects with the Entyvio. Reviewed the medication with her. She has no questions or concerns for me at this time. No recommendation for any changes at this time.  ? ?Benard Halsted, PharmD, BCACP, CPP ?Clinical Pharmacist ?[place] ?[phone] ? ? ? ? ? ?

## 2021-05-16 ENCOUNTER — Other Ambulatory Visit (HOSPITAL_COMMUNITY): Payer: Self-pay

## 2021-05-17 ENCOUNTER — Encounter: Payer: Self-pay | Admitting: Gastroenterology

## 2021-05-17 ENCOUNTER — Other Ambulatory Visit (HOSPITAL_COMMUNITY): Payer: Self-pay

## 2021-05-18 ENCOUNTER — Other Ambulatory Visit (HOSPITAL_COMMUNITY): Payer: Self-pay

## 2021-05-19 ENCOUNTER — Encounter: Payer: Self-pay | Admitting: Gastroenterology

## 2021-05-19 ENCOUNTER — Other Ambulatory Visit (HOSPITAL_COMMUNITY): Payer: Self-pay

## 2021-05-23 ENCOUNTER — Other Ambulatory Visit (HOSPITAL_COMMUNITY): Payer: Self-pay

## 2021-06-02 ENCOUNTER — Encounter: Payer: Self-pay | Admitting: Gastroenterology

## 2021-06-03 ENCOUNTER — Ambulatory Visit (INDEPENDENT_AMBULATORY_CARE_PROVIDER_SITE_OTHER): Payer: 59 | Admitting: *Deleted

## 2021-06-03 ENCOUNTER — Encounter: Payer: Self-pay | Admitting: Gastroenterology

## 2021-06-03 VITALS — BP 114/73 | HR 62 | Temp 98.0°F | Resp 18 | Ht 66.0 in | Wt 229.0 lb

## 2021-06-03 DIAGNOSIS — K523 Indeterminate colitis: Secondary | ICD-10-CM | POA: Diagnosis not present

## 2021-06-03 MED ORDER — VEDOLIZUMAB 300 MG IV SOLR
300.0000 mg | INTRAVENOUS | Status: DC
  Administered 2021-06-03: 300 mg via INTRAVENOUS
  Filled 2021-06-03: qty 5

## 2021-06-03 NOTE — Progress Notes (Signed)
Diagnosis: Indeterminate Colitis ? ?Provider:  Marshell Garfinkel, MD ? ?Procedure: Infusion ? ?IV Type: Peripheral, IV Location: L Antecubital ? ?Entyvio (Vedolizumab), , Dose: 300 mg ? ?Infusion Start Time: [id number] am ? ?Infusion Stop Time: 0930 am ? ?Post Infusion IV Care: Observation period completed and Peripheral IV Discontinued ? ?Discharge: Condition: Good, Destination: Home . AVS provided to patient.  ? ?Performed by:  Oren Beckmann, RN  ?  ?

## 2021-06-05 ENCOUNTER — Encounter: Payer: Self-pay | Admitting: Certified Registered Nurse Anesthetist

## 2021-06-10 ENCOUNTER — Ambulatory Visit (AMBULATORY_SURGERY_CENTER): Payer: 59 | Admitting: Gastroenterology

## 2021-06-10 ENCOUNTER — Encounter: Payer: Self-pay | Admitting: Gastroenterology

## 2021-06-10 VITALS — BP 148/93 | HR 56 | Temp 98.1°F | Resp 11 | Ht 66.0 in | Wt 227.0 lb

## 2021-06-10 DIAGNOSIS — K523 Indeterminate colitis: Secondary | ICD-10-CM

## 2021-06-10 DIAGNOSIS — K5150 Left sided colitis without complications: Secondary | ICD-10-CM | POA: Diagnosis not present

## 2021-06-10 DIAGNOSIS — K529 Noninfective gastroenteritis and colitis, unspecified: Secondary | ICD-10-CM | POA: Diagnosis not present

## 2021-06-10 MED ORDER — SODIUM CHLORIDE 0.9 % IV SOLN: 500.0000 mL | Freq: Once | INTRAVENOUS | Status: DC

## 2021-06-10 NOTE — Progress Notes (Signed)
[facility] History and Physical ? ? ?Primary Care Physician:  Donita Brooks, MD ? ? ?Reason for Procedure:   colitis ? ?Plan:    colonoscopy ? ? ? ? ?HPI: Theresa Farmer is a 41 y.o. female  here for colonoscopy surveillance of colitis now on high dose Entyvio, previously had elevated fecal calprotectin. Patient denies any bowel symptoms at this time. No family history of colon cancer known. Otherwise feels well without any cardiopulmonary symptoms.  ? ? ?Past Medical History:  ?Diagnosis Date  ? Allergy   ? Endometriosis   ? Gastritis   ? GERD (gastroesophageal reflux disease)   ? H/O seasonal allergies   ? IBS (irritable bowel syndrome)   ? Ulcerative colitis ([facility])   ? ? ?Past Surgical History:  ?Procedure Laterality Date  ? COLONOSCOPY  11/2014, 03/2016  ? GANGLION CYST EXCISION Left 04/15/2018  ? Procedure: REMOVAL VOLAR GANGLION OF LEFT WRIST;  Surgeon: Betha Loa, MD;  Location: [facility];  Service: Orthopedics;  Laterality: Left;  ? TUBAL LIGATION    ? WISDOM TOOTH EXTRACTION    ? ? ?Prior to Admission medications   ?Medication Sig Start Date End Date Taking? Authorizing Provider  ?cetirizine (ZYRTEC) 10 MG tablet Take 10 mg by mouth daily as needed for allergies.   Yes [provider]  ?Cholecalciferol (D-3-5) 5000 units capsule Take 2 capsules (10,000 Units total) by mouth daily. 06/15/17  Yes [place], Velna Hatchet, MD  ?fluticasone (FLONASE) 50 MCG/ACT nasal spray Place 1 spray into both nostrils daily as needed for allergies. 01/09/20  Yes [place], Velna Hatchet, MD  ?BIOTIN PO Take by mouth.    [provider]  ?furosemide (LASIX) 40 MG tablet TAKE 1 TABLET BY MOUTH DAILY AS NEEDED 02/18/21   Donita Brooks, MD  ?omeprazole (PRILOSEC) 40 MG capsule TAKE 1 CAPSULE (40 MG TOTAL) BY MOUTH DAILY. 12/28/20   Airlie Blumenberg, Willaim Rayas, MD  ?polyethylene glycol (MIRALAX) 17 g packet Take 17 g by mouth daily. Titrate as needed 11/30/20   Amyriah Buras, Willaim Rayas, MD  ?vedolizumab  (ENTYVIO) 300 MG injection infuse one 300mg  vial every 4 weeks 05/13/21   05/15/21, MD  ?vitamin C (ASCORBIC ACID) 500 MG tablet Take 1 tablet (500 mg total) by mouth daily. 06/15/17   06/17/17, MD  ? ? ?Current Outpatient Medications  ?Medication Sig Dispense Refill  ? cetirizine (ZYRTEC) 10 MG tablet Take 10 mg by mouth daily as needed for allergies.    ? Cholecalciferol (D-3-5) 5000 units capsule Take 2 capsules (10,000 Units total) by mouth daily.    ? fluticasone (FLONASE) 50 MCG/ACT nasal spray Place 1 spray into both nostrils daily as needed for allergies. 48 g 2  ? BIOTIN PO Take by mouth.    ? furosemide (LASIX) 40 MG tablet TAKE 1 TABLET BY MOUTH DAILY AS NEEDED 30 tablet 3  ? omeprazole (PRILOSEC) 40 MG capsule TAKE 1 CAPSULE (40 MG TOTAL) BY MOUTH DAILY. 90 capsule 1  ? polyethylene glycol (MIRALAX) 17 g packet Take 17 g by mouth daily. Titrate as needed 14 each 0  ? vedolizumab (ENTYVIO) 300 MG injection infuse one 300mg  vial every 4 weeks 1 mL 12  ? vitamin C (ASCORBIC ACID) 500 MG tablet Take 1 tablet (500 mg total) by mouth daily.    ? ?Current Facility-Administered Medications  ?Medication Dose Route Frequency Provider Last Rate Last Admin  ? 0.9 %  sodium chloride infusion  500 mL Intravenous Once  Benancio Deeds, MD      ? ? ?Allergies as of 06/10/2021 - Review Complete 06/10/2021  ?Allergen Reaction Noted  ? Keflex [cephalexin]  01/26/2012  ? ? ?Family History  ?Problem Relation Age of Onset  ? Rheum arthritis Mother   ? Immunodeficiency Father   ? Bullous pemphigoid Father   ? Lupus Sister   ? Breast cancer Maternal Grandmother   ? Colon cancer Other   ?     cousins and uncles  ? Diabetes Other   ?     several maternal relatives  ? Hypertension Other   ?     several paternal and maternal relatives  ? Irritable bowel syndrome Other   ?     mother  ? Colon polyps Neg Hx   ? Esophageal cancer Neg Hx   ? Rectal cancer Neg Hx   ? Stomach cancer Neg Hx   ? ? ?Social History   ? ?Socioeconomic History  ? Marital status: Married  ?  Spouse name: Not on file  ? Number of children: 4  ? Years of education: Not on file  ? Highest education level: Not on file  ?Occupational History  ? 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 Smoking status: Never  ? Smokeless tobacco: Never  ?Vaping Use  ? Vaping Use: Never used  ?Substance and Sexual Activity  ? Alcohol use: Not Currently  ? Drug use: Yes  ?  Types: Marijuana  ?  Comment: occ- not used recently per pt  ? Sexual activity: Yes  ?Other Topics Concern  ? Not on file  ?Social History Narrative  ? Not on file  ? ?Social Determinants of Health  ? ?Financial Resource Strain: Not on file  ?Food Insecurity: Not on file  ?Transportation Needs: Not on file  ?Physical Activity: Not on file  ?Stress: Not on file  ?Social Connections: Not on file  ?Intimate Partner Violence: Not on file  ? ? ?Review of Systems: ?All other review of systems negative except as mentioned in the HPI. ? ?Physical Exam: ?Vital signs ?BP (!) 145/86   Pulse 60   Temp 98.1 ?F (36.7 ?C)   Resp 16   Ht 5\' 6"  (1.676 m)   Wt 227 lb (103 kg)   LMP 06/09/2021   SpO2 100%   BMI 36.64 kg/m?  ? ?General:   Alert,  Well-developed, pleasant and cooperative in NAD ?Lungs:  Clear throughout to auscultation.   ?Heart:  Regular rate and rhythm ?Abdomen:  Soft, nontender and nondistended.   ?Neuro/Psych:  Alert and cooperative. Normal mood and affect. A and O x 3 ? ?06/11/2021, MD ?[facility] ? ? ?

## 2021-06-10 NOTE — Progress Notes (Signed)
Called to room to assist during endoscopic procedure.  Patient ID and intended procedure confirmed with present staff. Received instructions for my participation in the procedure from the performing physician.  

## 2021-06-10 NOTE — Op Note (Signed)
[place] ?Patient Name: Theresa Farmer ?Procedure Date: 06/10/2021 1:24 PM ?MRN: [id number] ?Endoscopist: Carlota Raspberry. 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                          every 4 weeks. Previously on Humira, developed  ?                          immunogenicity on it, resolved with adding  ?                          methotrexate but stopped it given fear of needles.  ?                          Has been feeling well on Entyvio (higher dosing in  ?                          light of subtherapeutic levels on q 8 week dosing),  ?                          fecal calprotectin elevated to 400s but she feels  ?                          well ?Medicines:                Monitored Anesthesia Care ?Procedure:                Pre-Anesthesia Assessment: ?                          - Prior to the procedure, a History and Physical  ?                          was performed, and patient medications and  ?                          allergies were reviewed. The patient's tolerance of  ?                          previous anesthesia was also reviewed. The risks  ?                          and benefits of the procedure and the sedation  ?                          options and risks were discussed with the patient.  ?                          All questions were answered, and informed consent  ?  was obtained. Prior Anticoagulants: The patient has  ?                          taken no previous anticoagulant or antiplatelet  ?                          agents. ASA Grade Assessment: II - A patient with  ?                          mild systemic disease. After reviewing the risks  ?                          and benefits, the patient was deemed in  ?                          satisfactory condition to undergo the procedure. ?                           After obtaining informed consent, the colonoscope  ?                          was passed under direct vision. Throughout the  ?                          procedure, the patient's blood pressure, pulse, and  ?                          oxygen saturations were monitored continuously. The  ?                          Olympus CF-[id number]) Colonoscope was  ?                          introduced through the anus and advanced to the the  ?                          terminal ileum, with identification of the  ?                          appendiceal orifice and IC valve. The colonoscopy  ?                          was performed without difficulty. The patient  ?                          tolerated the procedure well. The quality of the  ?                          bowel preparation was adequate. The terminal ileum,  ?                          ileocecal valve, appendiceal orifice, and rectum  ?  were photographed. ?Scope In: 1:36:26 PM ?Scope Out: 1:50:05 PM ?Scope Withdrawal Time: 0 hours 9 minutes 57 seconds  ?Total Procedure Duration: 0 hours 13 minutes 39 seconds  ?Findings:                 The perianal and digital rectal examinations were  ?                          normal. ?                          The terminal ileum appeared normal. ?                          Patchy inflammation characterized by erythema,  ?                          friability and granularity was found in the sigmoid  ?                          colon, in the transverse colon, in the ascending  ?                          colon and in the cecum. This was moderate in the  ?                          right colon and mild in the transverse and sigmoid  ?                          colon. Rectum was normal. Biopsies were taken with  ?                          a cold forceps for histology. ?                          Internal hemorrhoids were found during  ?                          retroflexion. The hemorrhoids were  small. ?                          The exam was otherwise without abnormality. ?Complications:            No immediate complications. Estimated blood loss:  ?                          Minimal. ?Estimated Blood Loss:     Estimated blood loss was minimal. ?Impression:               - The examined portion of the ileum was normal. ?                          - Patchy inflammation noted as above, biopsies  ?                          taken. ?                          -  Internal hemorrhoids. ?                          - The examination was otherwise normal. ?                          Overall, patient has active disease despite high  ?                          dose Entyvio - moderate in the right colon, mild  ?                          elsewhere. Recommend change in regimen but will  ?                          need to discuss options with her. She had  ?                          immunogenicity to Humira, would recommend Stelera  ?                          but she has a fear of needles. Otherwise can  ?                          consider Remicade + thiopurine. ?                          Given endoscopic appearance I suspect she most  ?                          likely has Crohn's colitis ?Recommendation:           - Patient has a contact number available for  ?                          emergencies. The signs and symptoms of potential  ?                          delayed complications were discussed with the  ?                          patient. Return to normal activities tomorrow.  ?                          Written discharge instructions were provided to the  ?                          patient. ?                          - Resume previous diet. ?                          - Continue present medications. ?                          -  Await pathology results. Recommend change in  ?                          regimen as outlined. ?Carlota Raspberry. Zykira Matlack, MD ?06/10/2021 1:58:01 PM ?This report has been signed electronically. ?

## 2021-06-10 NOTE — Progress Notes (Signed)
Cell phone off per pt  

## 2021-06-10 NOTE — Progress Notes (Signed)
Report given to PACU, vss 

## 2021-06-10 NOTE — Patient Instructions (Signed)
Await pathology results. ? ?Handout on hemorrhoids given. ? ?YOU HAD AN ENDOSCOPIC PROCEDURE TODAY AT [facility]:   Refer to the procedure report that was given to you for any specific questions about what was found during the examination.  If the procedure report does not answer your questions, please call your gastroenterologist to clarify.  If you requested that your care partner not be given the details of your procedure findings, then the procedure report has been included in a sealed envelope for you to review at your convenience later. ? ?YOU SHOULD EXPECT: Some feelings of bloating in the abdomen. Passage of more gas than usual.  Walking can help get rid of the air that was put into your GI tract during the procedure and reduce the bloating. If you had a lower endoscopy (such as a colonoscopy or flexible sigmoidoscopy) you may notice spotting of blood in your stool or on the toilet paper. If you underwent a bowel prep for your procedure, you may not have a normal bowel movement for a few days. ? ?Please Note:  You might notice some irritation and congestion in your nose or some drainage.  This is from the oxygen used during your procedure.  There is no need for concern and it should clear up in a day or so. ? ?SYMPTOMS TO REPORT IMMEDIATELY: ? ?Following lower endoscopy (colonoscopy or flexible sigmoidoscopy): ? Excessive amounts of blood in the stool ? Significant tenderness or worsening of abdominal pains ? Swelling of the abdomen that is new, acute ? Fever of 100?F or higher ? ? ?For urgent or emergent issues, a gastroenterologist can be reached at any hour by calling [phone]. ?Do not use MyChart messaging for urgent concerns.  ? ? ?DIET:  We do recommend a small meal at first, but then you may proceed to your regular diet.  Drink plenty of fluids but you should avoid alcoholic beverages for 24 hours. ? ?ACTIVITY:  You should plan to take it easy for the rest of today and you  should NOT DRIVE or use heavy machinery until tomorrow (because of the sedation medicines used during the test).   ? ?FOLLOW UP: ?Our staff will call the number listed on your records 48-72 hours following your procedure to check on you and address any questions or concerns that you may have regarding the information given to you following your procedure. If we do not reach you, we will leave a message.  We will attempt to reach you two times.  During this call, we will ask if you have developed any symptoms of COVID 19. If you develop any symptoms (ie: fever, flu-like symptoms, shortness of breath, cough etc.) before then, please call [phone].  If you test positive for Covid 19 in the 2 weeks post procedure, please call and report this information to [place].   ? ?If any biopsies were taken you will be contacted by phone or by letter within the next 1-3 weeks.  Please call us at [phone] if you have not heard about the biopsies in 3 weeks.  ? ? ?SIGNATURES/CONFIDENTIALITY: ?You and/or your care partner have signed paperwork which will be entered into your electronic medical record.  These signatures attest to the fact that that the information above on your After Visit Summary has been reviewed and is understood.  Full responsibility of the confidentiality of this discharge information lies with you and/or your care-partner.  ?

## 2021-06-14 ENCOUNTER — Telehealth: Payer: Self-pay

## 2021-06-14 NOTE — Telephone Encounter (Signed)
?  Follow up Call- ? ? ?  06/10/2021  ? 12:59 PM 05/26/2020  ? 10:09 AM  ?Call back number  ?Post procedure Call Back phone  # [phone]  ?Permission to leave phone message Yes Yes  ?  ? ?Patient questions: ? ?Do you have a fever, pain , or abdominal swelling? No. ?Pain Score  0 * ? ?Have you tolerated food without any problems? Yes.   ? ?Have you been able to return to your normal activities? Yes.   ? ?Do you have any questions about your discharge instructions: ?Diet   No. ?Medications  No. ?Follow up visit  No. ? ?Do you have questions or concerns about your Care? No. ? ?Actions: ?* If pain score is 4 or above: ?No action needed, pain <4. ? ? ?

## 2021-07-01 ENCOUNTER — Ambulatory Visit (INDEPENDENT_AMBULATORY_CARE_PROVIDER_SITE_OTHER): Payer: 59

## 2021-07-01 VITALS — BP 123/80 | HR 56 | Temp 98.6°F | Resp 18 | Ht 66.0 in | Wt 230.6 lb

## 2021-07-01 DIAGNOSIS — K523 Indeterminate colitis: Secondary | ICD-10-CM

## 2021-07-01 MED ORDER — VEDOLIZUMAB 300 MG IV SOLR
300.0000 mg | INTRAVENOUS | Status: DC
  Administered 2021-07-01: 300 mg via INTRAVENOUS
  Filled 2021-07-01: qty 5

## 2021-07-01 NOTE — Progress Notes (Signed)
Diagnosis: Crohn's Disease ? ?Provider:  Chilton Greathouse, MD ? ?Procedure: Infusion ? ?IV Type: Peripheral, IV Location: R Antecubital ? ?Entyvio (Vedolizumab), Dose: 300 mg ? ?Infusion Start Time: 0910 ? ?Infusion Stop Time: 0945 ? ?Post Infusion IV Care: Peripheral IV Discontinued ? ?Discharge: Condition: Good, Destination: Home . AVS provided to patient.  ? ?Performed by:  Nat Math, RN  ?  ?

## 2021-07-11 ENCOUNTER — Encounter: Payer: Self-pay | Admitting: Gastroenterology

## 2021-07-11 ENCOUNTER — Other Ambulatory Visit (INDEPENDENT_AMBULATORY_CARE_PROVIDER_SITE_OTHER): Payer: 59

## 2021-07-11 ENCOUNTER — Ambulatory Visit: Payer: 59 | Admitting: Gastroenterology

## 2021-07-11 VITALS — BP 120/82 | HR 68 | Ht 66.0 in | Wt 228.6 lb

## 2021-07-11 DIAGNOSIS — K523 Indeterminate colitis: Secondary | ICD-10-CM

## 2021-07-11 LAB — CBC WITH DIFFERENTIAL/PLATELET
Basophils Absolute: 0 10*3/uL (ref 0.0–0.1)
Basophils Relative: 0.4 % (ref 0.0–3.0)
Eosinophils Absolute: 0.1 10*3/uL (ref 0.0–0.7)
Eosinophils Relative: 1.2 % (ref 0.0–5.0)
HCT: 39.5 % (ref 36.0–46.0)
Hemoglobin: 13.1 g/dL (ref 12.0–15.0)
Lymphocytes Relative: 28.4 % (ref 12.0–46.0)
Lymphs Abs: 2.5 10*3/uL (ref 0.7–4.0)
MCHC: 33.1 g/dL (ref 30.0–36.0)
MCV: 80 fl (ref 78.0–100.0)
Monocytes Absolute: 0.5 10*3/uL (ref 0.1–1.0)
Monocytes Relative: 5.5 % (ref 3.0–12.0)
Neutro Abs: 5.7 10*3/uL (ref 1.4–7.7)
Neutrophils Relative %: 64.5 % (ref 43.0–77.0)
Platelets: 297 10*3/uL (ref 150.0–400.0)
RBC: 4.94 Mil/uL (ref 3.87–5.11)
RDW: 12.8 % (ref 11.5–15.5)
WBC: 8.8 10*3/uL (ref 4.0–10.5)

## 2021-07-11 LAB — COMPREHENSIVE METABOLIC PANEL
ALT: 15 U/L (ref 0–35)
AST: 14 U/L (ref 0–37)
Albumin: 4.1 g/dL (ref 3.5–5.2)
Alkaline Phosphatase: 83 U/L (ref 39–117)
BUN: 12 mg/dL (ref 6–23)
CO2: 29 mEq/L (ref 19–32)
Calcium: 9.3 mg/dL (ref 8.4–10.5)
Chloride: 103 mEq/L (ref 96–112)
Creatinine, Ser: 1.13 mg/dL (ref 0.40–1.20)
GFR: 61.11 mL/min (ref >60.00)
Glucose, Bld: 96 mg/dL (ref 70–99)
Potassium: 4 mEq/L (ref 3.5–5.1)
Sodium: 138 mEq/L (ref 135–145)
Total Bilirubin: 0.4 mg/dL (ref 0.2–1.2)
Total Protein: 7.7 g/dL (ref 6.0–8.3)

## 2021-07-11 LAB — VITAMIN D 25 HYDROXY (VIT D DEFICIENCY, FRACTURES): VITD: 35.65 ng/mL (ref 30.00–100.00)

## 2021-07-11 MED ORDER — ZOSTER VAC RECOMB ADJUVANTED 50 MCG/0.5ML IM SUSR: 0.5000 mL | Freq: Once | INTRAMUSCULAR | 0 refills | Status: AC

## 2021-07-11 NOTE — Progress Notes (Signed)
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 Inflammation most in right and transverse colon, very mild in left colon. Had paradoxical worsening on Lialda. 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 Eventually placed on Humira and had some initial improvement but had some elevated inflammatory markers which led to testing drug levels. She had subtherapeutic Humira levels (2.4) with low level AB present (26). Inflammatory markers were positive. She was started on methotrexate 15mg  po q week in May 0000000 along with folic acid 1mg . Humira was changed to 40mg  weekly at that time. Her antibodies to Humira went away and her Humira level went from 2.4 to 5.7. Follow up colonoscopy in 2018 showed mucosal healing. Patient self-stopped all therapy in 2018 as she was feeling well, haven't seen her since that time.  She represented in 09/2019 on no medical therapy, she had fear of needles and declined Humira.  Placed on steroids and then transition to [facility] Sept 2021.   SINCE LAST VISIT   41 year old female here for a follow-up visit.  Recall she has had escalating dosing of Entyvio in the setting of elevated fecal calprotectin.  We performed a surveillance colonoscopy for her on April 21 which showed active colitis throughout her entire colon.  Moderate in the right colon, mild in the transverse and left.  Biopsies showed chronic colitis.  Fortunately she has felt pretty well throughout this time, has not had any flares of her colitis and generally has tolerated the Entyvio very well.  She denies any problems with rectal bleeding or problems with her bowels right now. She does have some abdominal cramping during her menstrual cycles.  She has had problems with endometriosis, her cycles have been longer and can have significant bleeding with this.  She is been followed by the gynecologist.  She has been on oral contraceptives in the past which she does not  think have helped, she is completely off oral contraceptives at this time.  She continues to have menorrhagia and abdominal cramping with her menses.  She had no evidence of colonic involvement of her endometriosis.  She had her last dosing of Entyvio on May 12.  We discussed options moving forward.  Recall she stopped using Humira in the past due to her fear of needles, she is really hoping to avoid doing injectable therapies in the future.   Prior workup:  Colonoscopy 04/07/2016 - mild ileal inflammation, small cecal patch of inflammation - otherwise normal colon - all biopsies returned as normal, in remission   Colonoscopy 05/26/20 - The perianal and digital rectal examinations were normal. - The terminal ileum appeared normal. - Patchy mild inflammation characterized by small patchy areas of erosions, erythema and granularity was found in the ascending colon and in the cecum. Overall severity is mild - Internal hemorrhoids were found during retroflexion. The hemorrhoids were small. - The exam was otherwise without abnormality. No inflammation of the rest of the colon appreciated. - Biopsies were taken with a cold forceps from the right colon, left colon, transverse colon and rectum. These biopsy specimens were sent to Pathology.   1. Surgical [P], right colon - INACTIVE CHRONIC NONSPECIFIC COLITIS CHARACTERIZED BY MILD ARCHITECTURAL CHANGES. SEE NOTE - NEGATIVE FOR GRANULOMAS OR DYSPLASIA 2. Surgical [P], transverse colon - COLONIC MUCOSA WITH NO SPECIFIC HISTOPATHOLOGIC CHANGES - NEGATIVE FOR ACUTE INFLAMMATION, FEATURES OF CHRONICITY, GRANULOMAS OR DYSPLASIA 3. Surgical [P], left colon - COLONIC MUCOSA WITHIN NORMAL LIMITS; HOWEVER, WELL-HEALED CHRONIC COLITIS CANNOT  BE RULED OUT - NEGATIVE FOR GRANULOMAS OR DYSPLASIA   Entyvio levels 07/08/20 - level of 6.6, AB undetectable   Fecal calprotectin of 486 on 12/15/20    Colonoscopy 06/10/21: The perianal and digital rectal examinations  were normal. - The terminal ileum appeared normal. - Patchy inflammation characterized by erythema, friability and granularity was found in the sigmoid colon, in the transverse colon, in the ascending colon and in the cecum. This was moderate in the right colon and mild in the transverse and sigmoid colon. Rectum was normal. Biopsies were taken with a cold forceps for histology. - Internal hemorrhoids were found during retroflexion. The hemorrhoids were small. - The exam was otherwise without abnormality.  1. Surgical [P], right colon - CHRONIC COLITIS - NO ACUTE INFLAMMATION, GRANULOMATA OR MALIGNANCY IDENTIFIED 2. Surgical [P], colon, transverse - CHRONIC COLITIS - NO ACUTE INFLAMMATION, GRANULOMATA OR MALIGNANCY IDENTIFIED 3. Surgical [P], left colon - CHRONIC COLITIS, FOCALLY ACTIVE - NO GRANULOMATA OR MALIGNANCY IDENTIFIED   Past Medical History:  Diagnosis Date   Allergy    Endometriosis    Gastritis    GERD (gastroesophageal reflux disease)    H/O seasonal allergies    IBS (irritable bowel syndrome)    Ulcerative colitis ([place])      Past Surgical History:  Procedure Laterality Date   COLONOSCOPY  11/2014, 03/2016   GANGLION CYST EXCISION Left 04/15/2018   Procedure: REMOVAL VOLAR GANGLION OF LEFT WRIST;  Surgeon: Leanora Cover, MD;  Location: [facility];  Service: Orthopedics;  Laterality: Left;   TUBAL LIGATION     WISDOM TOOTH EXTRACTION     Family History  Problem Relation Age of Onset   Rheum arthritis Mother    Immunodeficiency Father    Bullous pemphigoid Father    Lupus Sister    Breast cancer Maternal Grandmother    Colon cancer Other        cousins and uncles   Diabetes Other        several maternal relatives   Hypertension Other        several paternal and maternal relatives   Irritable bowel syndrome Other        mother   Colon polyps Neg Hx    Esophageal cancer Neg Hx    Rectal cancer Neg Hx    Stomach cancer Neg Hx    Social  History   Tobacco Use   Smoking status: Never   Smokeless tobacco: Never  Vaping Use   Vaping Use: Never used  Substance Use Topics   Alcohol use: Not Currently   Drug use: Yes    Types: Marijuana    Comment: occ- not used recently per pt   Current Outpatient Medications  Medication Sig Dispense Refill   cetirizine (ZYRTEC) 10 MG tablet Take 10 mg by mouth daily as needed for allergies.     Cholecalciferol (D-3-5) 5000 units capsule Take 2 capsules (10,000 Units total) by mouth daily. (Patient taking differently: Take 5,000 Units by mouth daily.)     fluticasone (FLONASE) 50 MCG/ACT nasal spray Place 1 spray into both nostrils daily as needed for allergies. 48 g 2   omeprazole (PRILOSEC) 40 MG capsule TAKE 1 CAPSULE (40 MG TOTAL) BY MOUTH DAILY. (Patient taking differently: Take 40 mg by mouth daily as needed.) 90 capsule 1   polyethylene glycol (MIRALAX) 17 g packet Take 17 g by mouth daily. Titrate as needed (Patient taking differently: Take 17 g by mouth daily as needed.) 14 each  0   vedolizumab (ENTYVIO) 300 MG injection infuse one 300mg  vial every 4 weeks 1 mL 12   vitamin C (ASCORBIC ACID) 500 MG tablet Take 1 tablet (500 mg total) by mouth daily.     Zoster Vaccine Adjuvanted [facility]) injection Inject 0.5 mLs into the muscle once for 1 dose. 0.5 mL 0   No current facility-administered medications for this visit.   Allergies  Allergen Reactions   Keflex [Cephalexin]     hives     Review of Systems: All systems reviewed and negative except where noted in HPI.     Physical Exam: BP 120/82   Pulse 68   Ht 5\' 6"  (1.676 m)   Wt 228 lb 9.6 oz (103.7 kg)   BMI 36.90 kg/m  Constitutional: Pleasant,well-developed, female in no acute distress. Neurological: Alert and oriented to person place and time. Psychiatric: Normal mood and affect. Behavior is normal.   ASSESSMENT AND PLAN: 41 year old female here for reassessment of the following:  Indeterminate colitis -  IBD  As above, on higher dose Entyvio every 4 weeks, had fecal calprotectin in the 400s and she has active disease on colonoscopy, worst in the right side of her colon compared to the left.  She is tolerating this fairly well, has really minimal bowel symptoms and likes the regimen of Entyvio and its favorable safety profile, however we discussed goals of therapy not only to make her feel well but to prevent/minimize her risk for colon cancer.  I do not think her current regimen of Weyman Rodney is going to work long-term for her given active inflammation noted on her colonoscopy which correlates well with her elevated fecal calprotectin.  In this light I'm recommending a change of therapy.  We discussed options.  She does have a fear of needles and is hoping to avoid injectable therapies if at all possible.  If she did go on injectable therapy she may need to come to the office to help with injection.  She has been on Humira in the past.  In this light of fear of needles, I think Rinvoq is a great option for her.  Currently approved for UC and reportedly will be for Crohn's in the very near future - she has indeterminate colitis, perhaps this may be Crohn's colitis but unclear.  I am going to send basic labs today to include QuantiFERON gold.  If Rinvoq can be covered by insurance I would like to transition her to it.  We discussed risks of therapy for a bit, she and husband verbalized understanding.  I am recommending she get the shingles vaccine (Shingrix) now given increased risk for shingles on Rinvoq.  If she gets this improved we will give her 45 mg once daily for 8 weeks and then transition to maintenance therapy.  Moving forward we will trend her fecal calprotectin and survey with another colonoscopy likely sometime next year once maintained on therapy and stable. We will await her labs first, get clearance from her insurance company and hopefully start Rinvoq in the near future.  Plan: - lab today CBC,  CMET, quantiferon gold, vitamin D - referred for Shingrix vaccine - stop Entyvio - hoping to start Rinvoq 45 mg once daily for 8 weeks; maintenance: 15 mg once daily thereafter - follow up in 3-4 months  Jolly Mango, MD [facility]

## 2021-07-11 NOTE — Patient Instructions (Addendum)
If you are age 40 or older, your body mass index should be between 23-30. Your Body mass index is 36.9 kg/m. If this is out of the aforementioned range listed, please consider follow up with your Primary Care Provider.  If you are age 56 or younger, your body mass index should be between 19-25. Your Body mass index is 36.9 kg/m. If this is out of the aformentioned range listed, please consider follow up with your Primary Care Provider.   ________________________________________________________  The [place] providers would like to encourage you to use [facility] to communicate with providers for non-urgent requests or questions.  Due to long hold times on the telephone, sending your provider a message by [facility] may be a faster and more efficient way to get a response.  Please allow 48 business hours for a response.  Please remember that this is for non-urgent requests.  _______________________________________________________  Please go to the lab in the basement of our building to have lab work done as you leave today. Hit "B" for basement when you get on the elevator.  When the doors open the lab is on your left.  We will call you with the results. Thank you.  You will be contacted by a [facility] Complete Nurse Ambassador.  We will work on getting you approved to start Rinvoq 45 mg once daily for 8 weeks.  Then you will taper down to a maintenance dose of 15 mg once daily thereafter.  We are giving you a Shingrix order to take to your pharmacy.  Thank you for entrusting me with your care and for choosing [facility], Dr.  Cellar

## 2021-07-16 LAB — QUANTIFERON-TB GOLD PLUS
Mitogen-NIL: 4.47 IU/mL
NIL: 0.04 IU/mL
QuantiFERON-TB Gold Plus: NEGATIVE
TB1-NIL: 0.06 IU/mL
TB2-NIL: 0.03 IU/mL

## 2021-07-19 ENCOUNTER — Telehealth: Payer: Self-pay | Admitting: Gastroenterology

## 2021-07-19 DIAGNOSIS — K50119 Crohn's disease of large intestine with unspecified complications: Secondary | ICD-10-CM

## 2021-07-19 DIAGNOSIS — K529 Noninfective gastroenteritis and colitis, unspecified: Secondary | ICD-10-CM

## 2021-07-19 NOTE — Telephone Encounter (Signed)
Thanks [employer]. I think she more likely has Crohn's than UC, would do 45mg  / day for 12 weeks and then drop to 30mg  / day thereafter given she has failed Entyvio.  Can she go to the pharmacy to see how much the Rinvoq would be? If she has been approved for $5 co pay perhaps that is all she will need. Not sure how long it will take to proceed. Thanks for your help

## 2021-07-19 NOTE — Telephone Encounter (Signed)
Armbruster, Willaim Rayas, MD  Missy Sabins, RN Ashley can you help contact the patient about the following:  -TB testing is negative, labs otherwise look okay  - I want to start her on Rinvoq at this point in time (we have already discussed this in the office at length), hopefully this is covered by insurance.  She has indeterminate colitis, favor Crohn's disease.  Is now approved for both ulcerative colitis and Crohn's disease.  Given I favor Crohn's disease and recommend Rinvoq 45 mg daily for 12 weeks, then decrease to 30 mg/day thereafter.  Can you please help order this for her, there may be paperwork involved with Rinvoq that has to be completed as well.  She should talk to her pharmacist about coverage.  - After she starts therapy I would like to check a CBC and CMET in 6 weeks or so.  At 12 weeks she will need a lipid panel.  - I had previously recommended that she get a shingles vaccine (Shingrix), hopefully she has been able to start the process.  - Can you let me know about Rinvoq covered regarding her insurance etc and paperwork for this that may be needed? Jan has done this recently for other patients if you have questions about it. Thanks  - I would like to see her back in the office in 2 months or so after starting therapy    Called and spoke with patient regarding her lab results and recommendations. Pt states that she will be traveling to [place] from 6/24-7/1. Pt states that she is a little concerned about starting the medication and not being in the states. She states that she is unsure if she may have a reaction, they water in [place] is different. We did discuss the need for repeat labs 6 weeks after starting and at the 12 week mark. Pt is aware that I will contact her with you recommendations regarding when to start Rinvoq. Please advise. Thanks

## 2021-07-19 NOTE — Telephone Encounter (Signed)
Patient called this morning inquiring about the medication Dr. Havery Moros wanted her to switch to.  Her concern is that she is going out of the country in June.  She wanted to know if she should get her shingles shot and take her infusion for June before she leaves, not take the infusion and just start the new medication in July when she returns.  Please call and advise.  Thank you.

## 2021-07-19 NOTE — Telephone Encounter (Signed)
Okay. Well, if she can get the medication soon she could start it and know if any reaction, etc, as she is not leaving for almost another month. I think it would be safe to take there if she is tolerating it before she leaves. That being said if it ends up being a few weeks prior to being able to start it, then yes would not start the new medication just prior to leaving for her vacation. In that setting if she wants to get her Entyvio infusion to try and keep her from flaring then she can do that. She is at risk for flaring however without making a change in her therapy and don't want her to have a flare while she is out of country.   I would say let's see how fast she can actually get the Rinvoq and then make a decision about whether or not to start it before she leaves.

## 2021-07-19 NOTE — Telephone Encounter (Signed)
Called and spoke with patient regarding information below. Pt states that she received a call and was enrolled in a $5 co-pay card for Rinvoq. The forms were completed at the time of her last office visit. Pt has not heard wether or not the medication has been approved or not. Pt is aware that we will keep her updated and hope that she can start in the upcoming weeks. I told pt if we are not able to get the medication soon then she would need to wait until she returns from [place] before starting and can get Entyvio infusion in the interim is she wants. Pt is in agreeance with this plan and had no concerns at the end of the call.   Dr. Adela Lank, can you please clarify prescription. Your office note states Rinvoq 45 mg daily for 8 weeks and maintenance dose is 15 mg daily thereafter. Your note below states Rinvoq 45 mg daily for 12 weeks and maintenance dose is Rinvoq 30 mg daily. Please advise, thanks.

## 2021-07-19 NOTE — Telephone Encounter (Signed)
Thanks [employer]. Can you clarify when she is leaving and for how long? She can do the Shingrix vaccine for sure at any time. Depending on how long she will be out for and where she is going can discuss when to start Rinvoq. I had sent you a results note from her labs about Rinvoq and some paperwork that may be needed for her and to see if this will be covered by her insurance anyway. If you can let me know where she is going and dates I can clarify for her what we should do with her meds. Thanks

## 2021-07-20 ENCOUNTER — Other Ambulatory Visit: Payer: Self-pay

## 2021-07-20 MED ORDER — RINVOQ 45 MG PO TB24
45.0000 mg | ORAL_TABLET | Freq: Every day | ORAL | 0 refills | Status: DC
  Filled 2021-07-20: qty 84, fill #0

## 2021-07-20 NOTE — Telephone Encounter (Signed)
Updated patient diagnosis information online with [employer] in the Complete[employer] app. (Dr. Havery Moros, please confirm updated diagnosis code of K50.9 Crohn's of large intestine).  App reveals that Benefits investigation has been completed and RINVOQ is covered. Note: this was done with prior Dx of Indeterminate colitis. Per app, Patient is eligible to use the $5 copay savings card. but Prior Josem Kaufmann is required.  Submitted PA for Crohn's disease- Rinvoq 45 mg: once daily for 12 weeks,  84 tablets for an 84 day supply. via [place] (Key: [id number]).

## 2021-07-20 NOTE — Progress Notes (Signed)
Rinvoq script sent to [facility]

## 2021-07-21 ENCOUNTER — Other Ambulatory Visit (HOSPITAL_COMMUNITY): Payer: Self-pay

## 2021-07-21 ENCOUNTER — Encounter: Payer: Self-pay | Admitting: Pharmacist

## 2021-07-21 ENCOUNTER — Ambulatory Visit: Payer: 59 | Attending: Family Medicine | Admitting: Pharmacist

## 2021-07-21 DIAGNOSIS — Z79899 Other long term (current) drug therapy: Secondary | ICD-10-CM

## 2021-07-21 MED ORDER — RINVOQ 45 MG PO TB24
45.0000 mg | ORAL_TABLET | Freq: Every day | ORAL | 0 refills | Status: DC
  Filled 2021-07-21: qty 84, fill #0
  Filled 2021-07-22 – 2021-07-25 (×2): qty 28, 28d supply, fill #0
  Filled 2021-09-08: qty 28, 28d supply, fill #1
  Filled 2021-10-18: qty 28, 28d supply, fill #2

## 2021-07-21 NOTE — Progress Notes (Signed)
  S: Patient presents today for review of their specialty medication.   Patient is currently taking Rinvoq for rheumatoid arthritis. Patient is managed by Dr. Havery Moros for this.   Dosing: Adult  Note: May be used as monotherapy or in combination with methotrexate or other nonbiologic disease-modifying antirheumatic drugs (DMARDs); use in combination with biologic DMARDS or potent immunosuppressants (eg, azathioprine, cyclosporine) is not recommended. Do not initiate therapy in patients with an absolute lymphocyte count <500/mm3, ANC <1,000/mm3, or hemoglobin <8 g/dL. Rheumatoid arthritis: Oral: 15 mg once daily.  Adherence: has not started   Efficacy: has not started   Monitoring: S/sx thromboembolism: none  S/sx malignancy: none  S/sx of infection: none   Current adverse effects: none    O:     Lab Results  Component Value Date   WBC 8.8 07/11/2021   HGB 13.1 07/11/2021   HCT 39.5 07/11/2021   MCV 80.0 07/11/2021   PLT 297.0 07/11/2021      Chemistry      Component Value Date/Time   NA 138 07/11/2021 0956   K 4.0 07/11/2021 0956   CL 103 07/11/2021 0956   CO2 29 07/11/2021 0956   BUN 12 07/11/2021 0956   CREATININE 1.13 07/11/2021 0956   CREATININE 1.09 (H) 10/18/2020 1635      Component Value Date/Time   CALCIUM 9.3 07/11/2021 0956   ALKPHOS 83 07/11/2021 0956   AST 14 07/11/2021 0956   ALT 15 07/11/2021 0956   BILITOT 0.4 07/11/2021 0956       Lab Results  Component Value Date   CHOL 163 07/28/2019   HDL 52 07/28/2019   LDLCALC 97 07/28/2019   TRIG 46 07/28/2019   CHOLHDL 3.1 07/28/2019     A/P: 1. Medication review: patient currently prescribed Rinvoq for rheumatoid arthritis. Reviewed the medication with the patient, including the following: Rinvoq is a medication used to treat rheumatoid arthritis. Administer with or without food. Swallow tablet whole; do not crush, split, or chew. Possible adverse effects include increased risk of infection,  GI upset, hematologic toxicity, hepatic effects, lipid abnormalities, increased risk of malignancy, thromboembolism. Avoid live vaccinations. No recommendations for any changes.  Benard Halsted, PharmD, [place], [place] [phone]

## 2021-07-21 NOTE — Telephone Encounter (Signed)
Thanks Jan. Yes I would put Crohn's colitis as the diagnosis. Hopefully PA goes through and she can start it soon. Can you let me know if she is approved? Thanks

## 2021-07-22 ENCOUNTER — Other Ambulatory Visit (HOSPITAL_COMMUNITY): Payer: Self-pay

## 2021-07-22 NOTE — Telephone Encounter (Signed)
Jan, thank you, that is wonderful.  Can you please let the patient know and see if she wants to start it now, as she will be here for most of this month.  If she starts it now I would recommend some labs before she leaves town.  If she is anxious about this and does not want to start until she gets back from her vacation that is okay, but she is risking some flaring while she is abroad, up to her.

## 2021-07-22 NOTE — Telephone Encounter (Signed)
PA Approved for RINVOQ 45 mg  once daily (Med[employer]) through 10-20-21.  Called [facility] to request they process to determine cost to patient. They ran as a bottle of 28 tablets for 28 days and it has a $0 copay for patient.  They will order and it will be in on Tuesday.

## 2021-07-25 ENCOUNTER — Other Ambulatory Visit (HOSPITAL_COMMUNITY): Payer: Self-pay

## 2021-07-25 NOTE — Telephone Encounter (Signed)
Thanks Jan, she should have a CBC and CMET a few days prior to her leaving to make sure stable. She will need lipids in 12 weeks after starting and but I would like to see her back sometime in early August in the office so I can coordinate that lab draw then. Thanks

## 2021-07-25 NOTE — Telephone Encounter (Signed)
Called and spoke to patient. She understands that [facility] should have the medication in by tomorrow. She said she will pick it up and get started this week. She also indicates that she is planning on getting the Shingles vaccine in the next couple of weeks. Please advise on labs

## 2021-07-25 NOTE — Telephone Encounter (Signed)
MyChart message sent to patient to go for labs the week of June 19th

## 2021-07-26 ENCOUNTER — Other Ambulatory Visit (HOSPITAL_COMMUNITY): Payer: Self-pay

## 2021-07-28 ENCOUNTER — Other Ambulatory Visit (HOSPITAL_COMMUNITY): Payer: Self-pay

## 2021-07-29 ENCOUNTER — Other Ambulatory Visit (HOSPITAL_COMMUNITY): Payer: Self-pay

## 2021-07-29 ENCOUNTER — Ambulatory Visit: Payer: 59

## 2021-08-01 ENCOUNTER — Other Ambulatory Visit (HOSPITAL_COMMUNITY): Payer: Self-pay

## 2021-08-03 ENCOUNTER — Telehealth: Payer: Self-pay

## 2021-08-03 ENCOUNTER — Other Ambulatory Visit (HOSPITAL_COMMUNITY): Payer: Self-pay

## 2021-08-03 NOTE — Telephone Encounter (Signed)
Pt need a shingles vaccine , set up an appt. For patient to come in the morning to have this done.

## 2021-08-03 NOTE — Telephone Encounter (Signed)
Pt called in stating that she recently had a colonoscopy done, and was given some instruction of what she needed to do. Pt would just like to ask clinical staff about the instructions she was given. Pt would like for clinical staff to please give a cb.   CB#: [phone]

## 2021-08-04 ENCOUNTER — Ambulatory Visit: Payer: 59

## 2021-08-04 ENCOUNTER — Telehealth: Payer: Self-pay | Admitting: Gastroenterology

## 2021-08-04 ENCOUNTER — Other Ambulatory Visit (HOSPITAL_COMMUNITY): Payer: Self-pay

## 2021-08-04 DIAGNOSIS — K50119 Crohn's disease of large intestine with unspecified complications: Secondary | ICD-10-CM

## 2021-08-04 DIAGNOSIS — K529 Noninfective gastroenteritis and colitis, unspecified: Secondary | ICD-10-CM

## 2021-08-04 NOTE — Telephone Encounter (Signed)
Erroneous encounter. Please disregard.

## 2021-08-04 NOTE — Telephone Encounter (Signed)
Do you still want patient to get labs before leaving town? Please advise.

## 2021-08-04 NOTE — Telephone Encounter (Signed)
Moncell please see below note.

## 2021-08-04 NOTE — Telephone Encounter (Signed)
Patient called stating she could not get her shingles shot today because she was denied due to her insurance and may need a prior authorization from our office.  She is asking that you call her to discuss.  Thank you.

## 2021-08-10 ENCOUNTER — Telehealth: Payer: Self-pay | Admitting: Pharmacy Technician

## 2021-08-10 ENCOUNTER — Other Ambulatory Visit (HOSPITAL_COMMUNITY): Payer: Self-pay

## 2021-08-10 MED ORDER — ZOSTER VAC RECOMB ADJUVANTED 50 MCG/0.5ML IM SUSR: INTRAMUSCULAR | 0 refills | Status: DC

## 2021-08-10 NOTE — Telephone Encounter (Signed)
Shingrix order has been sent to pharmacy with diagnoses attached. Note to pharmacy was added stating that patient is on biologic therapy which can compromise the immune system.

## 2021-08-10 NOTE — Telephone Encounter (Signed)
Patient Advocate Encounter  Received notification from [facility] OFFICE that prior authorization for [facility] is required.   PA submitted on 6.21.23 Key [id number] Status is pending    Ricke Hey, CPhT Patient Advocate Phone: [phone]

## 2021-08-15 ENCOUNTER — Other Ambulatory Visit (HOSPITAL_COMMUNITY): Payer: Self-pay

## 2021-08-15 MED ORDER — ZOSTER VAC RECOMB ADJUVANTED 50 MCG/0.5ML IM SUSR
INTRAMUSCULAR | 1 refills | Status: DC
  Filled 2021-08-15: qty 0.5, 1d supply, fill #0
  Filled 2021-10-26: qty 0.5, 1d supply, fill #1

## 2021-08-18 ENCOUNTER — Other Ambulatory Visit (HOSPITAL_COMMUNITY): Payer: Self-pay

## 2021-08-19 ENCOUNTER — Other Ambulatory Visit (HOSPITAL_COMMUNITY): Payer: Self-pay

## 2021-08-19 ENCOUNTER — Encounter: Payer: Self-pay | Admitting: Gastroenterology

## 2021-08-20 ENCOUNTER — Other Ambulatory Visit (HOSPITAL_COMMUNITY): Payer: Self-pay

## 2021-08-22 ENCOUNTER — Other Ambulatory Visit (HOSPITAL_COMMUNITY): Payer: Self-pay

## 2021-09-05 ENCOUNTER — Ambulatory Visit: Payer: 59 | Admitting: Family Medicine

## 2021-09-05 VITALS — BP 128/76 | HR 54 | Temp 97.9°F

## 2021-09-05 DIAGNOSIS — K529 Noninfective gastroenteritis and colitis, unspecified: Secondary | ICD-10-CM | POA: Diagnosis not present

## 2021-09-05 DIAGNOSIS — N920 Excessive and frequent menstruation with regular cycle: Secondary | ICD-10-CM | POA: Diagnosis not present

## 2021-09-05 NOTE — Progress Notes (Signed)
Subjective:    Patient ID: Theresa Farmer, female    DOB: September 04, 1980, 41 y.o.   MRN: [id number]  HPI Patient has a history of inflammatory bowel disease.  She has been diagnosed with ulcerative colitis.  She is currently on Entyvio however recent colonoscopy showed active inflammation while on treatment.  Therefore her gastroenterologist recommended a switch to Rinvoq.  Patient is hesitant to switch treatment because she states that she feels well.  However I explained that there is active inflammation on her colonoscopy meaning that the [place] is not controlling her condition completely which could increase her risk of long-term complications such as strictures, etc.  She also would like a referral to GYN.  She has menorrhagia.  She is unable to take oral contraceptive pills or hormones due to the Rinvoq per her report.  Therefore she has to look for nonhormonal means to help manage her menorrhagia.  Her husband had pain with an IUD and I do not want to try this again.  She is interested in an ablation versus hysterectomy.  She does have a history of endometriosis and therefore may be a good candidate for a total abdominal hysterectomy.  She would like to discuss this with GYN. Past Medical History:  Diagnosis Date   Allergy    Endometriosis    Gastritis    GERD (gastroesophageal reflux disease)    H/O seasonal allergies    IBS (irritable bowel syndrome)    Ulcerative colitis (HCC)    Past Surgical History:  Procedure Laterality Date   COLONOSCOPY  11/2014, 03/2016   GANGLION CYST EXCISION Left 04/15/2018   Procedure: REMOVAL VOLAR GANGLION OF LEFT WRIST;  Surgeon: Betha Loa, MD;  Location: [facility];  Service: Orthopedics;  Laterality: Left;   TUBAL LIGATION     WISDOM TOOTH EXTRACTION     Current Outpatient Medications on File Prior to Visit  Medication Sig Dispense Refill   cetirizine (ZYRTEC) 10 MG tablet Take 10 mg by mouth daily as needed for allergies.      Cholecalciferol (D-3-5) 5000 units capsule Take 2 capsules (10,000 Units total) by mouth daily. (Patient taking differently: Take 5,000 Units by mouth daily.)     fluticasone (FLONASE) 50 MCG/ACT nasal spray Place 1 spray into both nostrils daily as needed for allergies. 48 g 2   omeprazole (PRILOSEC) 40 MG capsule TAKE 1 CAPSULE (40 MG TOTAL) BY MOUTH DAILY. (Patient taking differently: Take 40 mg by mouth daily as needed.) 90 capsule 1   polyethylene glycol (MIRALAX) 17 g packet Take 17 g by mouth daily. Titrate as needed (Patient taking differently: Take 17 g by mouth daily as needed.) 14 each 0   Upadacitinib ER (RINVOQ) 45 MG TB24 Take1 tablet (45 mg) by mouth daily. Take once daily for 12 weeks, then reduce to maintenance dose of 30 mg once daily thereafter 84 tablet 0   vitamin C (ASCORBIC ACID) 500 MG tablet Take 1 tablet (500 mg total) by mouth daily.     Zoster Vaccine Adjuvanted [facility]) injection Please inject 0.5 ml on day 1, then another 0.5 ml on day 31. 1 mL 0   Zoster Vaccine Adjuvanted [facility]) injection Inject into the muscle. 0.5 mL 1   No current facility-administered medications on file prior to visit.   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  Tobacco Use   Smoking status: Never   Smokeless tobacco: Never  Vaping Use   Vaping Use: Never used  Substance and Sexual Activity   Alcohol use: Not Currently   Drug use: Yes    Types: Marijuana    Comment: occ- not used recently per pt   Sexual activity: Yes  Other Topics Concern   Not on file  Social History Narrative   Not on file   Social Determinants of Health   Financial Resource Strain: Not on file  Food Insecurity: Not on file  Transportation Needs: Not on file  Physical  Activity: Not on file  Stress: Not on file  Social Connections: Not on file  Intimate Partner Violence: Not on file      Review of Systems  All other systems reviewed and are negative.      Objective:   Physical Exam Vitals reviewed.  Constitutional:      General: She is not in acute distress.    Appearance: Normal appearance. She is obese. She is not ill-appearing, toxic-appearing or diaphoretic.  Cardiovascular:     Rate and Rhythm: Normal rate and regular rhythm.     Heart sounds: Normal heart sounds. No murmur heard.    No friction rub. No gallop.  Pulmonary:     Effort: Pulmonary effort is normal. No respiratory distress.     Breath sounds: Normal breath sounds. No wheezing, rhonchi or rales.  Abdominal:     General: Abdomen is flat. Bowel sounds are normal.     Palpations: Abdomen is soft.  Musculoskeletal:     Right lower leg: No edema.     Left lower leg: No edema.  Neurological:     Mental Status: She is alert.           Assessment & Plan:  Menorrhagia with regular cycle - Plan: Ambulatory referral to Gynecology  IBD (inflammatory bowel disease) I explained that her colonoscopy confirmed active inflammation despite treatment with Entyvio.  Therefore her disease is not adequately suppressed.  I would recommend that she follow-up with her gastroenterologist suggestion.  I will also consult GYN regarding nonhormonal means to help manage her menorrhagia whether endometrial ablation versus hysterectomy.

## 2021-09-08 ENCOUNTER — Encounter: Payer: Self-pay | Admitting: Gastroenterology

## 2021-09-08 ENCOUNTER — Other Ambulatory Visit (HOSPITAL_COMMUNITY): Payer: Self-pay

## 2021-09-08 ENCOUNTER — Ambulatory Visit (INDEPENDENT_AMBULATORY_CARE_PROVIDER_SITE_OTHER): Payer: 59 | Admitting: Radiology

## 2021-09-08 ENCOUNTER — Encounter: Payer: Self-pay | Admitting: Radiology

## 2021-09-08 VITALS — BP 132/84 | Ht 66.0 in | Wt 231.0 lb

## 2021-09-08 DIAGNOSIS — N921 Excessive and frequent menstruation with irregular cycle: Secondary | ICD-10-CM

## 2021-09-08 NOTE — Progress Notes (Signed)
   Theresa Farmer 1981-01-03 [id number]   History:  41 y.o. G4P4 here as a new patient. She was referred by her PCP for Menorrhagia.  She was previously on OCPs given by her previous OBGYN which slowed her slow but was advised to stop once she was put on a biologic for ulcerative colitis. Periods can last 7-15 days, heavy, bright red bleeding. She has some cramping as well. Does have bleeding after sex that can last 3-4 days at a time. She tried the Mirena in the past but her husband could feel the strings so she had it removed. Last CBC did not indicate anemia 2 months ago, but bleeding has been heavier since.  Gynecologic History Patient's last menstrual period was 08/31/2021 (approximate). Period Pattern: (!) Irregular Menstrual Flow: Heavy Dysmenorrhea: (!) Severe Dysmenorrhea Symptoms: Cramping Contraception/Family planning: tubal ligation Sexually active: yes Last Pap: 2023. Results were: normal Last mammogram: never  Obstetric History OB History  Gravida Para Term Preterm AB Living  4         4  SAB IAB Ectopic Multiple Live Births          4    # Outcome Date GA Lbr Len/2nd Weight Sex Delivery Anes PTL Lv  4 Gravida           3 Gravida           2 Gravida           1 [place]              The following portions of the patient's history were reviewed and updated as appropriate: allergies, current medications, past family history, past medical history, past social history, past surgical history, and problem list.  Review of Systems Pertinent items noted in HPI and remainder of comprehensive ROS otherwise negative.   Past medical history, past surgical history, family history and social history were all reviewed and documented in the EPIC chart.   Exam:  Vitals:   09/08/21 1431  BP: 132/84  Weight: 231 lb (104.8 kg)  Height: 5\' 6"  (1.676 m)   Body mass index is 37.28 kg/m.  General appearance:  Normal Abdominal  Soft,nontender, without masses, guarding or  rebound.  Liver/spleen:  No organomegaly noted  Hernia:  None appreciated Genitourinary   Inguinal/mons:  Normal without inguinal adenopathy  External genitalia:  Normal appearing vulva with no masses, tenderness, or lesions  BUS/Urethra/Skene's glands:  Normal without masses or exudate  Vagina:  Normal appearing with normal color and discharge, no lesions  Cervix:  Normal appearing without discharge or lesions  Uterus:  Normal in size, shape and contour.  Mobile, nontender  Adnexa/parametria:     Rt: Normal in size, without masses or tenderness.   Lt: Normal in size, without masses or tenderness.  Anus and perineum: Normal   Chaperone offered and declined.   Assessment/Plan:   1. Menorrhagia with irregular cycle  - CBC - Ferritin - B12 and Folate Panel - Transvaginal Non-OB; Future     [place] B WHNP-BC 2:54 PM 09/08/2021

## 2021-09-09 ENCOUNTER — Encounter: Payer: Self-pay | Admitting: Gastroenterology

## 2021-09-09 ENCOUNTER — Encounter: Payer: Self-pay | Admitting: Family Medicine

## 2021-09-09 ENCOUNTER — Ambulatory Visit (HOSPITAL_COMMUNITY)
Admission: EM | Admit: 2021-09-09 | Discharge: 2021-09-09 | Disposition: A | Discharge status: Home / Self Care | Payer: 59 | Attending: Internal Medicine | Admitting: Internal Medicine

## 2021-09-09 ENCOUNTER — Other Ambulatory Visit (HOSPITAL_COMMUNITY): Payer: Self-pay

## 2021-09-09 ENCOUNTER — Encounter (HOSPITAL_COMMUNITY): Payer: Self-pay

## 2021-09-09 DIAGNOSIS — U071 COVID-19: Secondary | ICD-10-CM | POA: Diagnosis not present

## 2021-09-09 DIAGNOSIS — R509 Fever, unspecified: Secondary | ICD-10-CM | POA: Diagnosis present

## 2021-09-09 DIAGNOSIS — R079 Chest pain, unspecified: Secondary | ICD-10-CM | POA: Diagnosis not present

## 2021-09-09 DIAGNOSIS — B349 Viral infection, unspecified: Secondary | ICD-10-CM | POA: Insufficient documentation

## 2021-09-09 DIAGNOSIS — Z79899 Other long term (current) drug therapy: Secondary | ICD-10-CM | POA: Insufficient documentation

## 2021-09-09 DIAGNOSIS — R519 Headache, unspecified: Secondary | ICD-10-CM | POA: Insufficient documentation

## 2021-09-09 LAB — POC INFLUENZA A AND B ANTIGEN (URGENT CARE ONLY)
INFLUENZA A ANTIGEN, POC: NEGATIVE
INFLUENZA B ANTIGEN, POC: NEGATIVE

## 2021-09-09 MED ORDER — ACETAMINOPHEN 500 MG PO TABS: 1000.0000 mg | ORAL_TABLET | Freq: Four times a day (QID) | ORAL | 0 refills | Status: AC | PRN

## 2021-09-09 MED ORDER — GUAIFENESIN ER 1200 MG PO TB12: 1200.0000 mg | ORAL_TABLET | Freq: Two times a day (BID) | ORAL | 0 refills | Status: DC

## 2021-09-09 MED ORDER — ONDANSETRON 4 MG PO TBDP
4.0000 mg | ORAL_TABLET | Freq: Once | ORAL | Status: AC
Start: 2021-09-09 — End: 2021-09-09
  Administered 2021-09-09: 4 mg via ORAL

## 2021-09-09 MED ORDER — ONDANSETRON 4 MG PO TBDP
ORAL_TABLET | ORAL | Status: AC
  Filled 2021-09-09: qty 1

## 2021-09-09 MED ORDER — ACETAMINOPHEN 325 MG PO TABS
975.0000 mg | ORAL_TABLET | Freq: Once | ORAL | Status: AC
  Administered 2021-09-09: 975 mg via ORAL

## 2021-09-09 MED ORDER — ACETAMINOPHEN 325 MG PO TABS
ORAL_TABLET | ORAL | Status: AC
  Filled 2021-09-09: qty 3

## 2021-09-09 MED ORDER — PROMETHAZINE-DM 6.25-15 MG/5ML PO SYRP: 5.0000 mL | ORAL_SOLUTION | Freq: Four times a day (QID) | ORAL | 0 refills | Status: DC | PRN

## 2021-09-09 MED ORDER — MOLNUPIRAVIR EUA 200MG CAPSULE: 4.0000 | ORAL_CAPSULE | Freq: Two times a day (BID) | ORAL | 0 refills | Status: AC

## 2021-09-09 MED ORDER — ONDANSETRON 4 MG PO TBDP: 4.0000 mg | ORAL_TABLET | Freq: Three times a day (TID) | ORAL | 0 refills | Status: DC | PRN

## 2021-09-09 NOTE — ED Provider Notes (Signed)
[place]    CSN: [id number] Arrival date & time: 09/09/21  1248      History   Chief Complaint Chief Complaint  Patient presents with   Fever    HPI Theresa Farmer is a 41 y.o. female.   Patient presents to urgent care for evaluation of her body aches, fatigue, fever, cough, and nausea that started yesterday.  Patient states that she had a cough over the last couple of days and all of the other symptoms suddenly began yesterday.  Patient has had COVID-19 before and states that she feels worse than the last time that she had COVID-19 viral infection.  Patient also reports a small amount of shortness of breath and substernal chest pain as well.  Chest pain is worse with coughing.  Patient denies radiation of chest pain to the left arm, jaw, and diaphoresis.  Denies history of cardiac problems in the past.  Reporting headache that is a 9 on a scale 0-10 as well as photophobia.  Patient attempted to take medication at home prior to arrival urgent care for symptoms but was unable to due to nausea.  Patient denies vomiting, constipation, diarrhea, abdominal pain, urinary symptoms, neck pain, decreased visual acuity, blurry vision, dizziness, and confusion.  She was recently diagnosed with Crohn's disease and placed on Rinvoq.  Denies any other chronic medical problems requiring daily medications.  She has never been hospitalized for COVID-19 in the past and has received the COVID-19 vaccination.  She has not received the most up-to-date COVID-19 vaccination (bivalent).  Patient states that she works in an [employer] and is aware of 1 other person at her place of work who is sick with the same symptoms.  Denies any other known sick contacts other than the one person at work.   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   IBD (inflammatory bowel disease) 12/15/2014   Situational anxiety 12/15/2014   Class 2 obesity 01/11/2013   Unspecified constipation 01/11/2013    Past Surgical History:  Procedure Laterality Date   COLONOSCOPY  11/2014, 03/2016   GANGLION CYST EXCISION Left 04/15/2018   Procedure: REMOVAL VOLAR GANGLION OF LEFT WRIST;  Surgeon: Leanora Cover, MD;  Location: [place];  Service: Orthopedics;  Laterality: Left;   TUBAL LIGATION     WISDOM TOOTH EXTRACTION      OB History     Gravida  4   Para      Term      Preterm      AB      Living  4      SAB      IAB      Ectopic      Multiple      Live Births  4            Home Medications    Prior to Admission medications   Medication Sig Start Date End Date Taking? Authorizing Provider  acetaminophen (TYLENOL) 500 MG tablet Take 2 tablets (1,000 mg total) by mouth every 6 (six) hours as needed.  09/09/21  Yes Carlisle Beers, FNP  Guaifenesin 1200 MG TB12 Take 1 tablet (1,200 mg total) by mouth in the morning and at bedtime. 09/09/21  Yes Sten Dematteo, Donavan Burnet, FNP  molnupiravir EUA (LAGEVRIO) 200 mg CAPS capsule Take 4 capsules (800 mg total) by mouth 2 (two) times daily for 5 days. 09/09/21 09/14/21 Yes Carlisle Beers, FNP  ondansetron (ZOFRAN-ODT) 4 MG disintegrating tablet Take 1 tablet (4 mg total) by mouth every 8 (eight) hours as needed for nausea or vomiting. 09/09/21  Yes Carlisle Beers, FNP  promethazine-dextromethorphan (PROMETHAZINE-DM) 6.25-15 MG/5ML syrup Take 5 mLs by mouth 4 (four) times daily as needed for cough. 09/09/21  Yes Chivonne Rascon, Donavan Burnet, FNP  cetirizine (ZYRTEC) 10 MG tablet Take 10 mg by mouth daily as needed for allergies.    [provider]   Cholecalciferol (D-3-5) 5000 units capsule Take 2 capsules (10,000 Units total) by mouth daily. Patient taking differently: Take 5,000 Units by mouth daily. 06/15/17   [place], Velna Hatchet, MD  fluticasone [facility]) 50 MCG/ACT nasal spray Place 1 spray into both nostrils daily as needed for allergies. 01/09/20   [place], Velna Hatchet, MD  omeprazole (PRILOSEC) 40 MG capsule TAKE 1 CAPSULE (40 MG TOTAL) BY MOUTH DAILY. Patient not taking: Reported on 09/08/2021 12/28/20   Benancio Deeds, MD  polyethylene glycol (MIRALAX) 17 g packet Take 17 g by mouth daily. Titrate as needed Patient taking differently: Take 17 g by mouth daily as needed. 11/30/20   [place], Willaim Rayas, MD  Upadacitinib ER (RINVOQ) 45 MG TB24 Take1 tablet (45 mg) by mouth daily. Take once daily for 12 weeks, then reduce to maintenance dose of 30 mg once daily thereafter 07/21/21   Quentin Angst, MD  vitamin C (ASCORBIC ACID) 500 MG tablet Take 1 tablet (500 mg total) by mouth daily. 06/15/17   Salley Scarlet, MD  Zoster Vaccine Adjuvanted [facility]) injection Please inject 0.5 ml on day 1, then another 0.5 ml on day 31. Patient not taking: Reported on 09/08/2021 08/10/21   Benancio Deeds, MD  Zoster Vaccine Adjuvanted [facility]) injection Inject into the muscle. Patient not taking: Reported on 09/08/2021 08/15/21   Judyann Munson, MD    Family History Family History  Problem Relation Age of Onset   Rheum arthritis Mother    Immunodeficiency Father    Bullous pemphigoid Father    Lupus Sister    Breast cancer Maternal Grandmother    Colon cancer Other        cousins and uncles   Diabetes Other        several maternal relatives   Hypertension Other        several paternal and maternal relatives   Irritable bowel syndrome Other        mother   Colon polyps Neg Hx    Esophageal cancer Neg Hx    Rectal cancer Neg Hx    Stomach cancer Neg Hx     Social History Social History   Tobacco Use   Smoking status:  Never   Smokeless tobacco: Never  Vaping Use   Vaping Use: Never used  Substance Use Topics   Alcohol use: Not Currently   Drug use: Yes    Types: Marijuana     Allergies   Keflex [cephalexin]   Review of Systems Review of Systems  Constitutional:  Positive for fever.  Per HPI   Physical Exam Triage Vital Signs ED Triage Vitals  Enc Vitals Group  BP 09/09/21 1352 (!) 142/88     Pulse Rate 09/09/21 1352 95     Resp 09/09/21 1352 16     Temp 09/09/21 1352 (!) 100.8 F (38.2 C)     Temp Source 09/09/21 1352 Oral     SpO2 09/09/21 1352 99 %     Weight --      Height --      Head Circumference --      Peak Flow --      Pain Score 09/09/21 1354 8     Pain Loc --      Pain Edu? --      Excl. in [place]? --    No data found.  Updated Vital Signs BP (!) 142/88 (BP Location: Right Arm)   Pulse 95   Temp (!) 100.8 F (38.2 C) (Oral)   Resp 16   LMP 08/31/2021 (Approximate)   SpO2 99%   Visual Acuity Right Eye Distance:   Left Eye Distance:   Bilateral Distance:    Right Eye Near:   Left Eye Near:    Bilateral Near:     Physical Exam Vitals and nursing note reviewed.  Constitutional:      Appearance: Normal appearance. She is obese. She is ill-appearing. She is not toxic-appearing.     Comments: Very pleasant patient sitting on exam in position of comfort table in no acute distress.   HENT:     Head: Normocephalic and atraumatic.     Right Ear: Hearing, tympanic membrane, ear canal and external ear normal.     Left Ear: Hearing, tympanic membrane, ear canal and external ear normal.     Nose: Congestion present.     Right Turbinates: Swollen.     Left Turbinates: Swollen.     Right Sinus: No maxillary sinus tenderness or frontal sinus tenderness.     Left Sinus: No maxillary sinus tenderness or frontal sinus tenderness.     Mouth/Throat:     Lips: Pink.     Mouth: Mucous membranes are moist.     Pharynx: Oropharynx is clear. Uvula midline. No  oropharyngeal exudate or posterior oropharyngeal erythema.     Tonsils: No tonsillar exudate.  Eyes:     General: Lids are normal. Vision grossly intact. Gaze aligned appropriately.     Extraocular Movements: Extraocular movements intact.     Conjunctiva/sclera: Conjunctivae normal.     Right eye: Right conjunctiva is not injected.     Left eye: Left conjunctiva is not injected.     Pupils: Pupils are equal, round, and reactive to light.  Cardiovascular:     Rate and Rhythm: Normal rate and regular rhythm.     Heart sounds: Normal heart sounds, S1 normal and S2 normal.     Comments: Pain to the chest is not reproducible with palpation of the chest wall. Pulmonary:     Effort: Pulmonary effort is normal. No respiratory distress.     Breath sounds: Normal breath sounds and air entry.     Comments: Clear to auscultation bilaterally. Abdominal:     General: Bowel sounds are normal.     Palpations: Abdomen is soft.     Tenderness: There is no abdominal tenderness. There is no right CVA tenderness, left CVA tenderness or guarding.  Musculoskeletal:     Cervical back: Normal range of motion and neck supple.  Lymphadenopathy:     Cervical: No cervical adenopathy.  Skin:    General: Skin is warm and dry.  Capillary Refill: Capillary refill takes less than 2 seconds.     Findings: No rash.  Neurological:     General: No focal deficit present.     Mental Status: She is alert and oriented to person, place, and time. Mental status is at baseline.     Cranial Nerves: No dysarthria or facial asymmetry.     Sensory: No sensory deficit.     Motor: Weakness present.     Coordination: Coordination normal.     Gait: Gait is intact.     Comments: Generalized weakness present related to viral illness.  No focal neurologic deficit.  Psychiatric:        Mood and Affect: Mood normal.        Speech: Speech normal.        Behavior: Behavior normal.        Thought Content: Thought content normal.         Judgment: Judgment normal.      UC Treatments / Results  Labs (all labs ordered are listed, but only abnormal results are displayed) Labs Reviewed  SARS CORONAVIRUS 2 (TAT 6-24 HRS)  POC INFLUENZA A AND B ANTIGEN (URGENT CARE ONLY)    EKG   Radiology No results found.  Procedures Procedures (including critical care time)  Medications Ordered in UC Medications  acetaminophen (TYLENOL) tablet 975 mg (975 mg Oral Given 09/09/21 1427)  ondansetron (ZOFRAN-ODT) disintegrating tablet 4 mg (4 mg Oral Given 09/09/21 1427)    Initial Impression / Assessment and Plan / UC Course  I have reviewed the triage vital signs and the nursing notes.  Pertinent labs & imaging results that were available during my care of the patient were reviewed by me and considered in my medical decision making (see chart for details).  1.  Viral illness High suspicion for COVID-19 infection.  Patient symptoms improved in clinic after Tylenol and Zofran administration.  Plan to go ahead and treat with antiviral medication molnupiravir to prevent severe COVID-19 infection in case patient is positive.  Patient may stop this medication once COVID-19 results come back if they are negative.  Deferred imaging based on stable cardiopulmonary exam and hemodynamically stable vital signs.  Temperature reduced to 99.5 after Tylenol administration prior to discharge from clinic.  Plan to treat this viral upper respiratory infection with supportive care prescriptions for guaifenesin, Promethazine DM, Zofran, and Tylenol.  Next doses of Zofran and Tylenol outlined on patient's after visit summary since she was given some in the clinic today.  Advised not to take Promethazine DM and drink alcohol or drive as this medicine can make her sleepy.  Guaifenesin requires increased water intake for it to work well in her body and patient has been advised to drink at least 8 cups of water per day.  None of these medications interact  with her medication that she takes for her Crohn's disease.  Salt water gargles as well as a humidifier and warm tea with honey recommended.  Work note given.  PCP follow-up advised.  Strict ED precautions given.   Discussed physical exam and available lab work findings in clinic with patient.  Counseled patient regarding appropriate use of medications and potential side effects for all medications recommended or prescribed today. Discussed red flag signs and symptoms of worsening condition,when to call the PCP office, return to urgent care, and when to seek higher level of care in the emergency department. Patient verbalizes understanding and agreement with plan. All questions answered. Patient discharged in  stable condition.  Final Clinical Impressions(s) / UC Diagnoses   Final diagnoses:  Fever, unspecified fever cause  Viral illness     Discharge Instructions      You have a viral upper respiratory infection. Your COVID-19 testing is pending. Your influenza testing was negative. I suspect you may have COVID-19 and would like for you to go ahead and start taking the antiviral mediation called molnupiravir. Take this medication 4 capsules 2 times daily for the next 5 days to prevent severe infection.  Take guaifenesin 1200mg   2 times daily to thin your mucous so that you can cough it up and blow it out of your nose easier. Drink plenty of water while taking this medication so that it works well in your body (at least 8 cups a day).   Take Promethazine DM cough medication to help with your cough at nighttime so that you are able to sleep. Do not drive, drink alcohol, or go to work while taking this medication since it can make you sleepy. Only take this at nighttime.   You may take tylenol 1,000mg  every 6 hours with food as needed for fever/chills, sore throat, aches/pains, and inflammation associated with viral illness. Take this with food to avoid stomach upset.    You may use zofran 4mg   every 8 hours as needed for nausea and vomiting.  Your next dose of tylenol may be at 8pm since I gave you some in the clinic. Your next dose of zofran may be at 10pm since you were given some in the clinic for nausea.  You may do salt water and baking soda gargles every 4 hours as needed for your throat pain.  Please put 1 teaspoon of salt and 1/2 teaspoon of baking soda in 8 ounces of warm water then gargle and spit the water out. You may also put 1 tablespoon of honey in warm water and drink this to soothe your throat.  Place a humidifier in your room at night to help decrease dry air that can irritate your airway and cause you to have a sore throat and cough.  Please try to eat a well-balanced diet while you are sick so that your body gets proper nutrition to heal.  If you develop any new or worsening symptoms, please return.  If your symptoms are severe, please go to the emergency room.  Follow-up with your primary care provider for further evaluation and management of your symptoms as well as ongoing wellness visits.  I hope you feel better!      ED Prescriptions     Medication Sig Dispense Auth. Provider   molnupiravir EUA (LAGEVRIO) 200 mg CAPS capsule Take 4 capsules (800 mg total) by mouth 2 (two) times daily for 5 days. 40 capsule Joella Prince M, [place]   acetaminophen (TYLENOL) 500 MG tablet Take 2 tablets (1,000 mg total) by mouth every 6 (six) hours as needed. 30 tablet Joella Prince M, FNP   ondansetron (ZOFRAN-ODT) 4 MG disintegrating tablet Take 1 tablet (4 mg total) by mouth every 8 (eight) hours as needed for nausea or vomiting. 20 tablet Talbot Grumbling, FNP   promethazine-dextromethorphan (PROMETHAZINE-DM) 6.25-15 MG/5ML syrup Take 5 mLs by mouth 4 (four) times daily as needed for cough. 118 mL Joella Prince M, FNP   Guaifenesin 1200 MG TB12 Take 1 tablet (1,200 mg total) by mouth in the morning and at bedtime. 14 tablet Talbot Grumbling, FNP       PDMP not reviewed this  encounter.   Talbot Grumbling, [place] 09/09/21 1535

## 2021-09-09 NOTE — Discharge Instructions (Addendum)
You have a viral upper respiratory infection. Your COVID-19 testing is pending. Your influenza testing was negative. I suspect you may have COVID-19 and would like for you to go ahead and start taking the antiviral mediation called molnupiravir. Take this medication 4 capsules 2 times daily for the next 5 days to prevent severe infection.  Take guaifenesin 1200mg   2 times daily to thin your mucous so that you can cough it up and blow it out of your nose easier. Drink plenty of water while taking this medication so that it works well in your body (at least 8 cups a day).   Take Promethazine DM cough medication to help with your cough at nighttime so that you are able to sleep. Do not drive, drink alcohol, or go to work while taking this medication since it can make you sleepy. Only take this at nighttime.   You may take tylenol 1,000mg  every 6 hours with food as needed for fever/chills, sore throat, aches/pains, and inflammation associated with viral illness. Take this with food to avoid stomach upset.    You may use zofran 4mg  every 8 hours as needed for nausea and vomiting.  Your next dose of tylenol may be at 8pm since I gave you some in the clinic. Your next dose of zofran may be at 10pm since you were given some in the clinic for nausea.  You may do salt water and baking soda gargles every 4 hours as needed for your throat pain.  Please put 1 teaspoon of salt and 1/2 teaspoon of baking soda in 8 ounces of warm water then gargle and spit the water out. You may also put 1 tablespoon of honey in warm water and drink this to soothe your throat.  Place a humidifier in your room at night to help decrease dry air that can irritate your airway and cause you to have a sore throat and cough.  Please try to eat a well-balanced diet while you are sick so that your body gets proper nutrition to heal.  If you develop any new or worsening symptoms, please return.  If your symptoms are severe, please go to the  emergency room.  Follow-up with your primary care provider for further evaluation and management of your symptoms as well as ongoing wellness visits.  I hope you feel better!

## 2021-09-09 NOTE — ED Triage Notes (Signed)
Pt states fever,chills,body aches,dry cough, headache  and fatigue since last pm.  Has not taken anything for her symptoms

## 2021-09-10 ENCOUNTER — Encounter: Payer: Self-pay | Admitting: Family Medicine

## 2021-09-10 LAB — SARS CORONAVIRUS 2 (TAT 6-24 HRS): SARS Coronavirus 2: POSITIVE — AB

## 2021-09-12 ENCOUNTER — Encounter: Payer: Self-pay | Admitting: Gastroenterology

## 2021-09-12 ENCOUNTER — Other Ambulatory Visit: Payer: Self-pay

## 2021-09-13 ENCOUNTER — Other Ambulatory Visit: Payer: 59

## 2021-09-13 ENCOUNTER — Other Ambulatory Visit: Payer: 59 | Admitting: Radiology

## 2021-09-13 NOTE — Addendum Note (Signed)
Addended by: Rushie Goltz on: 09/13/2021 12:26 PM   Modules accepted: Orders

## 2021-09-14 LAB — CBC
HCT: 39.5 % (ref 35.0–45.0)
Hemoglobin: 12.8 g/dL (ref 11.7–15.5)
MCH: 26.6 pg — ABNORMAL LOW (ref 27.0–33.0)
MCHC: 32.4 g/dL (ref 32.0–36.0)
MCV: 82 fL (ref 80.0–100.0)
MPV: 10.1 fL (ref 7.5–12.5)
Platelets: 270 10*3/uL (ref 140–400)
RBC: 4.82 10*6/uL (ref 3.80–5.10)
RDW: 12.4 % (ref 11.0–15.0)
WBC: 7.9 10*3/uL (ref 3.8–10.8)

## 2021-09-14 LAB — TEST AUTHORIZATION

## 2021-09-14 LAB — FERRITIN: Ferritin: 50 ng/mL (ref 16–154)

## 2021-09-14 LAB — B12 AND FOLATE PANEL
Folate: 17.7 ng/mL
Vitamin B-12: 659 pg/mL (ref 200–1100)

## 2021-09-14 NOTE — Telephone Encounter (Signed)
Letter has been printed, sent to front desk to call pt if pt wants to pick it up. Pt should also see it in her MyChart.

## 2021-09-29 ENCOUNTER — Ambulatory Visit (INDEPENDENT_AMBULATORY_CARE_PROVIDER_SITE_OTHER): Payer: 59

## 2021-09-29 ENCOUNTER — Encounter: Payer: Self-pay | Admitting: Radiology

## 2021-09-29 ENCOUNTER — Ambulatory Visit (INDEPENDENT_AMBULATORY_CARE_PROVIDER_SITE_OTHER): Payer: 59 | Admitting: Radiology

## 2021-09-29 VITALS — BP 112/78

## 2021-09-29 DIAGNOSIS — N920 Excessive and frequent menstruation with regular cycle: Secondary | ICD-10-CM

## 2021-09-29 DIAGNOSIS — N921 Excessive and frequent menstruation with irregular cycle: Secondary | ICD-10-CM

## 2021-09-29 NOTE — Progress Notes (Signed)
   Theresa Farmer 1981-01-19 [id number]   History:  41 y.o.  with worsening menorrhagia presents for pelvic ultrasound.   Gynecologic History Patient's last menstrual period was 09/29/2021 (exact date).   Contraception/Family planning: tubal ligation Sexually active: yes  Obstetric History OB History  Gravida Para Term Preterm AB Living  4         4  SAB IAB Ectopic Multiple Live Births          4    # Outcome Date GA Lbr Len/2nd Weight Sex Delivery Anes PTL Lv  4 Gravida           3 Gravida           2 Gravida           1 [place]              The following portions of the patient's history were reviewed and updated as appropriate: allergies, current medications, past family history, past medical history, past social history, past surgical history, and problem list.  Review of Systems Pertinent items noted in HPI and remainder of comprehensive ROS otherwise negative.   Past medical history, past surgical history, family history and social history were all reviewed and documented in the EPIC chart.   Exam:  Vitals:   09/29/21 0856  BP: 112/78   There is no height or weight on file to calculate BMI.  Narrative & Impression Indications: menorrhagia   Vaginal scan:   Anteverted Uterus normal size and shape 8.27 x 7.01 x 4.78cm Single small, intramural fibroid 76m   Symmetrical secretory endometrium measuring 6.450mNo obvious masses or thickening or abnormal blood flow seen   Both ovaries normal size and normal follicle pattern and normal perfusion   No adnexal masses or free fluid   Impression: normal ultrasound with small fibroid  Assessment/Plan:   1. Menorrhagia with regular cycle Discussed progestin only treatment options as she is taking Rinvoq which increases her risk for a blood clot she is not a candidate for estrogen therapy. Would like to try Mirena again  - IUD Insertion; Future will schedule with menses    Wane Mollett B WHNP-BC 9:17 AM  09/29/2021

## 2021-10-03 ENCOUNTER — Other Ambulatory Visit (HOSPITAL_COMMUNITY): Payer: Self-pay

## 2021-10-05 ENCOUNTER — Other Ambulatory Visit (HOSPITAL_COMMUNITY): Payer: Self-pay

## 2021-10-06 ENCOUNTER — Ambulatory Visit (INDEPENDENT_AMBULATORY_CARE_PROVIDER_SITE_OTHER): Payer: 59 | Admitting: Radiology

## 2021-10-06 VITALS — BP 122/84

## 2021-10-06 DIAGNOSIS — N920 Excessive and frequent menstruation with regular cycle: Secondary | ICD-10-CM

## 2021-10-06 DIAGNOSIS — Z30430 Encounter for insertion of intrauterine contraceptive device: Secondary | ICD-10-CM

## 2021-10-06 DIAGNOSIS — Z01812 Encounter for preprocedural laboratory examination: Secondary | ICD-10-CM

## 2021-10-06 LAB — PREGNANCY, URINE: Preg Test, Ur: NEGATIVE

## 2021-10-06 MED ORDER — LEVONORGESTREL 20 MCG/DAY IU IUD: 1.0000 | INTRAUTERINE_SYSTEM | Freq: Once | INTRAUTERINE | Status: AC

## 2021-10-06 NOTE — Progress Notes (Signed)
   Theresa Farmer Aug 08, 1980 [id number]   History:  41 y.o. G4P4 presents for insertion of Mirena IUD for management of Menorrhagia.  Pt has been counseled about risks and benefits as well as complications.  Consent is obtained today.  Patient's last menstrual period was 09/29/2021 (exact date). GC/CT/Trich testing: obtained  Past medical history, past surgical history, family history and social history were all reviewed and documented in the EPIC chart.  ROS:  A ROS was performed and pertinent positives and negatives are included.  Exam: Vitals:   10/06/21 1012  BP: 122/84   There is no height or weight on file to calculate BMI.  Pelvic exam: Vulva:  normal female genitalia Vagina:  normal vagina, no discharge, exudate, lesion, or erythema Cervix:  Non-tender, Negative CMT, no lesions or redness. Uterus:  normal shape, position and consistency    Procedure:  Speculum inserted.   Cervix visualized and cleansed with Betadine x 3.  Tenaculum placed on anterior cervix. Then uterus sounded to 9 cm. IUD inserted easily. Strings trimmed to 3 cm.  Minimal bleeding noted.  Pt tolerated the procedure well.  Chaperone present: Leatrice Jewels, CMA   Assessment/Plan: 1. Menorrhagia with regular cycle - IUD Insertion - SURESWAB CT/NG/T. vaginalis - levonorgestrel (MIRENA) 20 MCG/DAY IUD 1 each  2. Encounter for preprocedural laboratory examination  - Pregnancy, urine    Return for recheck 4-6 weeks Pt aware to call for any concerns Pt aware removal due no later than 10/06/2029, IUD card given to pt.   Kerry Dory WHNP-BC, 10:31 AM 10/06/2021

## 2021-10-07 LAB — SURESWAB CT/NG/T. VAGINALIS
C. trachomatis RNA, TMA: NOT DETECTED
N. gonorrhoeae RNA, TMA: NOT DETECTED
Trichomonas vaginalis RNA: NOT DETECTED

## 2021-10-13 ENCOUNTER — Other Ambulatory Visit (INDEPENDENT_AMBULATORY_CARE_PROVIDER_SITE_OTHER): Payer: 59

## 2021-10-13 ENCOUNTER — Encounter: Payer: Self-pay | Admitting: Gastroenterology

## 2021-10-13 ENCOUNTER — Ambulatory Visit: Payer: 59 | Admitting: Gastroenterology

## 2021-10-13 VITALS — BP 118/78 | HR 85 | Ht 66.0 in | Wt 231.4 lb

## 2021-10-13 DIAGNOSIS — Z79899 Other long term (current) drug therapy: Secondary | ICD-10-CM | POA: Diagnosis not present

## 2021-10-13 DIAGNOSIS — K523 Indeterminate colitis: Secondary | ICD-10-CM

## 2021-10-13 LAB — CBC WITH DIFFERENTIAL/PLATELET
Basophils Absolute: 0 10*3/uL (ref 0.0–0.1)
Basophils Relative: 0.3 % (ref 0.0–3.0)
Eosinophils Absolute: 0.1 10*3/uL (ref 0.0–0.7)
Eosinophils Relative: 1.1 % (ref 0.0–5.0)
HCT: 37 % (ref 36.0–46.0)
Hemoglobin: 12.4 g/dL (ref 12.0–15.0)
Lymphocytes Relative: 27.2 % (ref 12.0–46.0)
Lymphs Abs: 2.4 10*3/uL (ref 0.7–4.0)
MCHC: 33.6 g/dL (ref 30.0–36.0)
MCV: 79.7 fl (ref 78.0–100.0)
Monocytes Absolute: 0.6 10*3/uL (ref 0.1–1.0)
Monocytes Relative: 6.8 % (ref 3.0–12.0)
Neutro Abs: 5.7 10*3/uL (ref 1.4–7.7)
Neutrophils Relative %: 64.6 % (ref 43.0–77.0)
Platelets: 246 10*3/uL (ref 150.0–400.0)
RBC: 4.64 Mil/uL (ref 3.87–5.11)
RDW: 13.9 % (ref 11.5–15.5)
WBC: 8.8 10*3/uL (ref 4.0–10.5)

## 2021-10-13 LAB — COMPREHENSIVE METABOLIC PANEL
ALT: 14 U/L (ref 0–35)
AST: 17 U/L (ref 0–37)
Albumin: 3.9 g/dL (ref 3.5–5.2)
Alkaline Phosphatase: 58 U/L (ref 39–117)
BUN: 11 mg/dL (ref 6–23)
CO2: 28 mEq/L (ref 19–32)
Calcium: 8.7 mg/dL (ref 8.4–10.5)
Chloride: 101 mEq/L (ref 96–112)
Creatinine, Ser: 1.13 mg/dL (ref 0.40–1.20)
GFR: 61 mL/min (ref >60.00)
Glucose, Bld: 93 mg/dL (ref 70–99)
Potassium: 4 mEq/L (ref 3.5–5.1)
Sodium: 137 mEq/L (ref 135–145)
Total Bilirubin: 0.5 mg/dL (ref 0.2–1.2)
Total Protein: 7.4 g/dL (ref 6.0–8.3)

## 2021-10-13 NOTE — Progress Notes (Signed)
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 Inflammation most in right and transverse colon, very mild in left colon. Had paradoxical worsening on Lialda. 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 Eventually placed on Humira and had some initial improvement but had some elevated inflammatory markers which led to testing drug levels. She had subtherapeutic Humira levels (2.4) with low level AB present (26). Inflammatory markers were positive. She was started on methotrexate 37m po q week in May [id number] with folic acid 171m Humira was changed to [id number] at that time. Her antibodies to Humira went away and her Humira level went from 2.4 to 5.7. Follow up colonoscopy in 2018 showed mucosal healing. Patient self-stopped all therapy in 2018 as she was feeling well, haven't seen her since that time.  She represented in 09/2019 on no medical therapy, she had fear of needles and declined Humira.  [facility]aced on steroids and then [facility] with active inflammation on colonoscopy despite escalation in dosing of Entyvio. Transitioned to Rinvoq in July 2023.   SINCE LAST VISIT   41 22ar old female here for follow-up visit for her colitis today.  Since have last seen her she was started on Rinvoq for treatment of her colitis.  It appears her first dose of that was around July 3.  She has been on 45 mg daily of Rinvoq and generally states she is feeling really well on the regimen and appears to be tolerating it well.  She did come down with COVID in early August, held a few doses of her Rinvoq, recovered from COVAndersonontinues to feel well.  She denies any flares of her colitis since have seen her.  No diarrhea or blood in the stools.  She has been on MiraLAX for constipation, tends to take it once daily and for the most part works for her.  I had recommended she get  a Shingrix vaccine while starting Rinvoq and she had her first dose of that already, has a pending second dose in the upcoming months.  She has been working with nurChief Executive Officerr RinWinigan make sure she gets her medications okay.  She is going to be on 45 mg daily for 12 weeks and then reduce to 15 mg daily thereafter.  She has not had labs done since starting the regimen.  Overall she is very happy with the oral regimen, she has a fear of needles and wants to avoid injectables if at all possible.    Prior workup:  Colonoscopy 04/07/2016 - mild ileal inflammation, small cecal patch of inflammation - otherwise normal colon - all biopsies returned as normal, in remission   Colonoscopy 05/26/20 - The perianal and digital rectal examinations were normal. - The terminal ileum appeared normal. - Patchy mild inflammation characterized by small patchy areas of erosions, erythema and granularity was found in the ascending colon and in the cecum. Overall severity is mild - Internal hemorrhoids were found during retroflexion. The hemorrhoids were small. - The exam was otherwise without abnormality. No inflammation of the rest of the colon appreciated. - Biopsies were taken with a cold forceps from the right colon, left colon, transverse colon and rectum. These biopsy specimens were sent to Pathology.   1. Surgical [P], right colon - INACTIVE CHRONIC NONSPECIFIC COLITIS CHARACTERIZED BY MILD ARCHITECTURAL CHANGES. SEE NOTE - NEGATIVE FOR GRANULOMAS OR DYSPLASIA 2. Surgical [P], transverse  colon - COLONIC MUCOSA WITH NO SPECIFIC HISTOPATHOLOGIC CHANGES - NEGATIVE FOR ACUTE INFLAMMATION, FEATURES OF CHRONICITY, GRANULOMAS OR DYSPLASIA 3. Surgical [P], left colon - COLONIC MUCOSA WITHIN NORMAL LIMITS; HOWEVER, WELL-HEALED CHRONIC COLITIS CANNOT BE RULED OUT - NEGATIVE FOR GRANULOMAS OR DYSPLASIA   Entyvio levels 07/08/20 - level of 6.6, AB undetectable   Fecal calprotectin of 486 on 12/15/20      Colonoscopy 06/10/21: The perianal and digital rectal examinations were normal. - The terminal ileum appeared normal. - Patchy inflammation characterized by erythema, friability and granularity was found in the sigmoid colon, in the transverse colon, in the ascending colon and in the cecum. This was moderate in the right colon and mild in the transverse and sigmoid colon. Rectum was normal. Biopsies were taken with a cold forceps for histology. - Internal hemorrhoids were found during retroflexion. The hemorrhoids were small. - The exam was otherwise without abnormality.   1. Surgical [P], right colon - CHRONIC COLITIS - NO ACUTE INFLAMMATION, GRANULOMATA OR MALIGNANCY IDENTIFIED 2. Surgical [P], colon, transverse - CHRONIC COLITIS - NO ACUTE INFLAMMATION, GRANULOMATA OR MALIGNANCY IDENTIFIED 3. Surgical [P], left colon - CHRONIC COLITIS, FOCALLY ACTIVE - NO GRANULOMATA OR MALIGNANCY IDENTIFIED    Past Medical History:  Diagnosis Date   Allergy    Endometriosis    Gastritis    GERD (gastroesophageal reflux disease)    H/O seasonal allergies    IBS (irritable bowel syndrome)    Indeterminate colitis    Ulcerative colitis ([place])      Past Surgical History:  Procedure Laterality Date   COLONOSCOPY  11/2014, 03/2016   GANGLION CYST EXCISION Left 04/15/2018   Procedure: REMOVAL VOLAR GANGLION OF LEFT WRIST;  Surgeon: Leanora Cover, MD;  Location: [place];  Service: Orthopedics;  Laterality: Left;   TUBAL LIGATION     WISDOM TOOTH EXTRACTION     Family History  Problem Relation Age of Onset   Rheum arthritis Mother    Immunodeficiency Father    Bullous pemphigoid Father    Lupus Sister    Breast cancer Maternal Grandmother    Colon cancer Other        cousins and uncles   Diabetes Other        several maternal relatives   Hypertension Other        several paternal and maternal relatives   Irritable bowel syndrome Other        mother   Colon polyps Neg  Hx    Esophageal cancer Neg Hx    Rectal cancer Neg Hx    Stomach cancer Neg Hx    Social History   Tobacco Use   Smoking status: Never   Smokeless tobacco: Never  Vaping Use   Vaping Use: Never used  Substance Use Topics   Alcohol use: Not Currently   Drug use: Yes    Types: Marijuana   Current Outpatient Medications  Medication Sig Dispense Refill   acetaminophen (TYLENOL) 500 MG tablet Take 2 tablets (1,000 mg total) by mouth every 6 (six) hours as needed. 30 tablet 0   cetirizine (ZYRTEC) 10 MG tablet Take 10 mg by mouth daily as needed for allergies.     Cholecalciferol (D-3-5) 5000 units capsule Take 2 capsules (10,000 Units total) by mouth daily. (Patient taking differently: Take 5,000 Units by mouth daily.)     fluticasone (FLONASE) 50 MCG/ACT nasal spray Place 1 spray into both nostrils daily as needed for allergies. 48 g 2  Guaifenesin 1200 MG TB12 Take 1 tablet (1,200 mg total) by mouth in the morning and at bedtime. 14 tablet 0   omeprazole (PRILOSEC) 40 MG capsule TAKE 1 CAPSULE (40 MG TOTAL) BY MOUTH DAILY. 90 capsule 1   ondansetron (ZOFRAN-ODT) 4 MG disintegrating tablet Take 1 tablet (4 mg total) by mouth every 8 (eight) hours as needed for nausea or vomiting. 20 tablet 0   polyethylene glycol (MIRALAX) 17 g packet Take 17 g by mouth daily. Titrate as needed (Patient taking differently: Take 17 g by mouth daily as needed.) 14 each 0   promethazine-dextromethorphan (PROMETHAZINE-DM) 6.25-15 MG/5ML syrup Take 5 mLs by mouth 4 (four) times daily as needed for cough. 118 mL 0   Upadacitinib ER (RINVOQ) 45 MG TB24 Take1 tablet (45 mg) by mouth daily. Take once daily for 12 weeks, then reduce to maintenance dose of 30 mg once daily thereafter 84 tablet 0   vitamin C (ASCORBIC ACID) 500 MG tablet Take 1 tablet (500 mg total) by mouth daily.     Zoster Vaccine Adjuvanted [facility]) injection Please inject 0.5 ml on day 1, then another 0.5 ml on day 31. 1 mL 0   Zoster Vaccine  Adjuvanted [facility]) injection Inject into the muscle. 0.5 mL 1   Current Facility-Administered Medications  Medication Dose Route Frequency Provider Last Rate Last Admin   levonorgestrel (MIRENA) 20 MCG/DAY IUD 1 each  1 each Intrauterine Once Chrzanowski, Jami B, NP       Allergies  Allergen Reactions   Keflex [Cephalexin]     hives     Review of Systems: All systems reviewed and negative except where noted in HPI.   Lab Results  Component Value Date   WBC 7.9 09/08/2021   HGB 12.8 09/08/2021   HCT 39.5 09/08/2021   MCV 82.0 09/08/2021   PLT 270 09/08/2021    Lab Results  Component Value Date   CREATININE 1.13 07/11/2021   BUN 12 07/11/2021   NA 138 07/11/2021   K 4.0 07/11/2021   CL 103 07/11/2021   CO2 29 07/11/2021   Lab Results  Component Value Date   ALT 15 07/11/2021   AST 14 07/11/2021   ALKPHOS 83 07/11/2021   BILITOT 0.4 07/11/2021     Physical Exam: BP 118/78   Pulse 85   Ht 5' 6"  (1.676 m)   Wt 231 lb 6.4 oz (105 kg)   LMP 09/29/2021 (Exact Date)   SpO2 97%   BMI 37.35 kg/m  Constitutional: Pleasant,well-developed, female in no acute distress. Neurological: Alert and oriented to person place and time. Psychiatric: Normal mood and affect. Behavior is normal.   ASSESSMENT: 41 y.o. female here for assessment of the following  1. Colitis, indeterminate   2. High risk medication use    As above, patient has failed Humira/methotrexate, intermediate metabolizer of TPMT, failed high-dose Entyvio, fear of needles. We had discussed options and risks of each therapy, eventually transitioned to Rinvoq in early July and has had an excellent response clinically.  No flares of her symptoms since being on the regimen and she likes the oral regimen better than other options.  We discussed her colitis in general, goals for therapy is to control symptoms and secondarily to minimize her risk for colon cancer.  She will continue 45 mg of Rinvoq until September  25 and then reduce to 15 mg daily thereafter.  I recommend she go to the lab for a CBC and CMET today to make sure  stable.  She is due for lipid panel at the end of September when she changes her dosing.  I had recommended shingles vaccine given increased risk for shingles, she has had her first dose and will have her next dose within the next few months.  Given her history of elevated fecal calprotectin, offered to do another fecal calprotectin in the next few weeks to see where this is trending and make sure okay.  Ideally we would do another colonoscopy in another 6 to 9 months or so to check for mucosal healing, if she is willing.  She agrees with the plan, all questions answered, will continue Rinvoq as outlined, I will see her in the office in 4 months, await labs in the interim.  She agrees  PLAN: - continue Rinvoq at 63m until Sept 25th then reduce to 142m/ day - CBC and CMET today - fecal calprotectin to be done in next few weeks - lipid panel Sept 24th - continue Shingles vaccine protocol - f/u 4 months - repeat colonoscopy in several months  StJolly MangoMD [facility]

## 2021-10-13 NOTE — Patient Instructions (Addendum)
Your provider has requested that you go to the basement level for lab work before leaving today. Press "B" on the elevator. The lab is located at the first door on the left as you exit the elevator.  Continue with shingles vaccination.   Please have your fasting lipid panel drawn on 11/13/21. You do not need an appointment. Go directly to our basement lab.  Due to recent changes in healthcare laws, you may see the results of your imaging and laboratory studies on MyChart before your provider has had a chance to review them.  We understand that in some cases there may be results that are confusing or concerning to you. Not all laboratory results come back in the same time frame and the provider may be waiting for multiple results in order to interpret others.  Please give [place] 48 hours in order for your provider to thoroughly review all the results before contacting the office for clarification of your results.   _______________________________________________________  If you are age 21 or older, your body mass index should be between 23-30. Your Body mass index is 37.35 kg/m. If this is out of the aforementioned range listed, please consider follow up with your Primary Care Provider.  If you are age 68 or younger, your body mass index should be between 19-25. Your Body mass index is 37.35 kg/m. If this is out of the aformentioned range listed, please consider follow up with your Primary Care Provider.   ________________________________________________________  The [facility] providers would like to encourage you to use [facility] to communicate with providers for non-urgent requests or questions.  Due to long hold times on the telephone, sending your provider a message by [facility] may be a faster and more efficient way to get a response.  Please allow 48 business hours for a response.  Please remember that this is for non-urgent requests.  _______________________________________________________

## 2021-10-14 ENCOUNTER — Other Ambulatory Visit: Payer: Self-pay

## 2021-10-14 DIAGNOSIS — Z79899 Other long term (current) drug therapy: Secondary | ICD-10-CM

## 2021-10-14 DIAGNOSIS — K523 Indeterminate colitis: Secondary | ICD-10-CM

## 2021-10-18 ENCOUNTER — Other Ambulatory Visit (HOSPITAL_COMMUNITY): Payer: Self-pay

## 2021-10-21 ENCOUNTER — Telehealth: Payer: Self-pay | Admitting: Pharmacy Technician

## 2021-10-21 ENCOUNTER — Other Ambulatory Visit (HOSPITAL_COMMUNITY): Payer: Self-pay

## 2021-10-21 NOTE — Telephone Encounter (Signed)
Patient Advocate Encounter  Received notification that prior authorization for RINVOQ 45MG is required.   PA submitted on 9.1.23 Key [id number] Status is pending    Luciano Cutter, CPhT Patient Advocate Phone: [phone]

## 2021-10-25 ENCOUNTER — Other Ambulatory Visit (HOSPITAL_COMMUNITY): Payer: Self-pay

## 2021-10-26 ENCOUNTER — Other Ambulatory Visit (HOSPITAL_COMMUNITY): Payer: Self-pay

## 2021-10-26 NOTE — Telephone Encounter (Signed)
Patient Advocate Encounter  Prior Authorization for Rinvoq has been approved.   Effective: 10/26/2021 to 10/26/2022  Clista Bernhardt, CPhT Rx Patient Advocate Phone: [phone]

## 2021-10-28 ENCOUNTER — Other Ambulatory Visit (HOSPITAL_COMMUNITY): Payer: Self-pay

## 2021-11-01 ENCOUNTER — Other Ambulatory Visit (HOSPITAL_COMMUNITY): Payer: Self-pay

## 2021-11-03 ENCOUNTER — Other Ambulatory Visit (HOSPITAL_COMMUNITY): Payer: Self-pay

## 2021-11-03 ENCOUNTER — Telehealth: Payer: Self-pay

## 2021-11-03 ENCOUNTER — Other Ambulatory Visit: Payer: Self-pay

## 2021-11-03 MED ORDER — RINVOQ 15 MG PO TB24
15.0000 mg | ORAL_TABLET | Freq: Every day | ORAL | 5 refills | Status: DC
  Filled 2021-11-03: qty 30, fill #0

## 2021-11-03 NOTE — Progress Notes (Signed)
error 

## 2021-11-03 NOTE — Telephone Encounter (Signed)
At 8-24 OV: Dr. Havery Moros indicated: "She will continue 45 mg of Rinvoq until September 25 and then reduce to 15 mg daily thereafter"   Sending maintenance dose script to [facility]

## 2021-11-04 ENCOUNTER — Other Ambulatory Visit (HOSPITAL_COMMUNITY): Payer: Self-pay

## 2021-11-04 ENCOUNTER — Other Ambulatory Visit: Payer: Self-pay | Admitting: Pharmacist

## 2021-11-04 MED ORDER — RINVOQ 15 MG PO TB24
15.0000 mg | ORAL_TABLET | Freq: Every day | ORAL | 5 refills | Status: DC
  Filled 2021-11-04: qty 30, 30d supply, fill #0
  Filled 2021-11-24: qty 30, 30d supply, fill #1
  Filled 2021-12-21: qty 30, 30d supply, fill #2

## 2021-11-11 ENCOUNTER — Other Ambulatory Visit: Payer: 59

## 2021-11-11 DIAGNOSIS — Z79899 Other long term (current) drug therapy: Secondary | ICD-10-CM

## 2021-11-11 DIAGNOSIS — K523 Indeterminate colitis: Secondary | ICD-10-CM

## 2021-11-14 ENCOUNTER — Telehealth: Payer: Self-pay

## 2021-11-14 NOTE — Telephone Encounter (Signed)
-----   Message from Yevette Edwards, RN sent at 10/14/2021  9:23 AM EDT ----- Regarding: Labs Lipid panel - order is in epic

## 2021-11-14 NOTE — Telephone Encounter (Signed)
MyChart message sent to patient with lab reminder. Lab order in epic.

## 2021-11-15 NOTE — Telephone Encounter (Signed)
Patient reviewed and MyChart message. Last read by Aviva Signs at  7:17 PM on 11/14/2021.

## 2021-11-16 LAB — CALPROTECTIN, FECAL: Calprotectin, Fecal: 55 ug/g (ref 0–120)

## 2021-11-17 ENCOUNTER — Other Ambulatory Visit (INDEPENDENT_AMBULATORY_CARE_PROVIDER_SITE_OTHER): Payer: 59

## 2021-11-17 DIAGNOSIS — Z79899 Other long term (current) drug therapy: Secondary | ICD-10-CM | POA: Diagnosis not present

## 2021-11-17 DIAGNOSIS — K523 Indeterminate colitis: Secondary | ICD-10-CM | POA: Diagnosis not present

## 2021-11-17 LAB — LIPID PANEL
Cholesterol: 175 mg/dL (ref 0–200)
HDL: 43.3 mg/dL (ref >39.00)
LDL Cholesterol: 119 mg/dL — ABNORMAL HIGH (ref 0–99)
NonHDL: 131.21
Total CHOL/HDL Ratio: 4
Triglycerides: 63 mg/dL (ref 0.0–149.0)
VLDL: 12.6 mg/dL (ref 0.0–40.0)

## 2021-11-21 ENCOUNTER — Other Ambulatory Visit (HOSPITAL_COMMUNITY): Payer: Self-pay

## 2021-11-23 ENCOUNTER — Ambulatory Visit: Payer: 59 | Admitting: Radiology

## 2021-11-24 ENCOUNTER — Other Ambulatory Visit (HOSPITAL_COMMUNITY): Payer: Self-pay

## 2021-11-25 ENCOUNTER — Ambulatory Visit: Payer: 59 | Admitting: Radiology

## 2021-11-25 VITALS — BP 126/84

## 2021-11-25 DIAGNOSIS — Z30431 Encounter for routine checking of intrauterine contraceptive device: Secondary | ICD-10-CM | POA: Diagnosis not present

## 2021-11-25 DIAGNOSIS — N921 Excessive and frequent menstruation with irregular cycle: Secondary | ICD-10-CM | POA: Diagnosis not present

## 2021-11-25 NOTE — Progress Notes (Signed)
     History:  41 y.o. G4P0 here today for today for IUD string check; Mirena IUD was placed  10/06/21. No complaints about the IUD, no concerning side effects.  The following portions of the patient's history were reviewed and updated as appropriate: allergies, current medications, past family history, past medical history, past social history, past surgical history and problem list.  Review of Systems:  Pertinent items are noted in HPI.   Objective:  Physical Exam Blood pressure 126/84. Gen: NAD Abd: Soft, nontender and nondistended Pelvic: Normal appearing external genitalia; normal appearing vaginal mucosa and cervix.  IUD strings visualized, about 2 cm in length outside cervix.  Chaperone offered and declined.  Assessment & Plan:  Normal IUD check. Patient may keep IUD in place for up to 8 years. May remover sooner  if she desires pregnancy, or has side effects within that time.   Rubbie Battiest, [facility]

## 2021-11-30 ENCOUNTER — Other Ambulatory Visit (HOSPITAL_COMMUNITY): Payer: Self-pay

## 2021-12-01 ENCOUNTER — Other Ambulatory Visit (HOSPITAL_COMMUNITY): Payer: Self-pay

## 2021-12-21 ENCOUNTER — Other Ambulatory Visit (HOSPITAL_COMMUNITY): Payer: Self-pay

## 2021-12-23 ENCOUNTER — Encounter: Payer: Self-pay | Admitting: Gastroenterology

## 2021-12-23 DIAGNOSIS — Z79899 Other long term (current) drug therapy: Secondary | ICD-10-CM

## 2021-12-23 DIAGNOSIS — K523 Indeterminate colitis: Secondary | ICD-10-CM

## 2021-12-26 ENCOUNTER — Other Ambulatory Visit (INDEPENDENT_AMBULATORY_CARE_PROVIDER_SITE_OTHER): Payer: 59

## 2021-12-26 DIAGNOSIS — K523 Indeterminate colitis: Secondary | ICD-10-CM

## 2021-12-26 DIAGNOSIS — Z79899 Other long term (current) drug therapy: Secondary | ICD-10-CM | POA: Diagnosis not present

## 2021-12-26 LAB — CBC WITH DIFFERENTIAL/PLATELET
Basophils Absolute: 0 10*3/uL (ref 0.0–0.1)
Basophils Relative: 0.1 % (ref 0.0–3.0)
Eosinophils Absolute: 0 10*3/uL (ref 0.0–0.7)
Eosinophils Relative: 0.4 % (ref 0.0–5.0)
HCT: 35.8 % — ABNORMAL LOW (ref 36.0–46.0)
Hemoglobin: 11.8 g/dL — ABNORMAL LOW (ref 12.0–15.0)
Lymphocytes Relative: 39.2 % (ref 12.0–46.0)
Lymphs Abs: 3.2 10*3/uL (ref 0.7–4.0)
MCHC: 33.1 g/dL (ref 30.0–36.0)
MCV: 81.9 fl (ref 78.0–100.0)
Monocytes Absolute: 0.6 10*3/uL (ref 0.1–1.0)
Monocytes Relative: 7.3 % (ref 3.0–12.0)
Neutro Abs: 4.4 10*3/uL (ref 1.4–7.7)
Neutrophils Relative %: 53 % (ref 43.0–77.0)
Platelets: 310 10*3/uL (ref 150.0–400.0)
RBC: 4.37 Mil/uL (ref 3.87–5.11)
RDW: 13.8 % (ref 11.5–15.5)
WBC: 8.3 10*3/uL (ref 4.0–10.5)

## 2021-12-26 LAB — COMPREHENSIVE METABOLIC PANEL
ALT: 16 U/L (ref 0–35)
AST: 16 U/L (ref 0–37)
Albumin: 4 g/dL (ref 3.5–5.2)
Alkaline Phosphatase: 67 U/L (ref 39–117)
BUN: 11 mg/dL (ref 6–23)
CO2: 27 mEq/L (ref 19–32)
Calcium: 8.6 mg/dL (ref 8.4–10.5)
Chloride: 103 mEq/L (ref 96–112)
Creatinine, Ser: 1.07 mg/dL (ref 0.40–1.20)
GFR: 65.04 mL/min (ref >60.00)
Glucose, Bld: 88 mg/dL (ref 70–99)
Potassium: 3.7 mEq/L (ref 3.5–5.1)
Sodium: 137 mEq/L (ref 135–145)
Total Bilirubin: 0.5 mg/dL (ref 0.2–1.2)
Total Protein: 7.2 g/dL (ref 6.0–8.3)

## 2021-12-27 ENCOUNTER — Other Ambulatory Visit: Payer: 59

## 2021-12-27 DIAGNOSIS — Z79899 Other long term (current) drug therapy: Secondary | ICD-10-CM

## 2021-12-27 DIAGNOSIS — K523 Indeterminate colitis: Secondary | ICD-10-CM

## 2021-12-29 LAB — CLOSTRIDIUM DIFFICILE BY PCR: Toxigenic C. Difficile by PCR: NEGATIVE

## 2021-12-30 ENCOUNTER — Other Ambulatory Visit (HOSPITAL_COMMUNITY): Payer: Self-pay

## 2022-01-01 LAB — CALPROTECTIN, FECAL: Calprotectin, Fecal: 942 ug/g — ABNORMAL HIGH (ref 0–120)

## 2022-01-03 ENCOUNTER — Encounter: Payer: Self-pay | Admitting: Gastroenterology

## 2022-01-10 ENCOUNTER — Other Ambulatory Visit (HOSPITAL_COMMUNITY): Payer: Self-pay

## 2022-01-16 ENCOUNTER — Encounter: Payer: Self-pay | Admitting: Gastroenterology

## 2022-01-16 ENCOUNTER — Other Ambulatory Visit (HOSPITAL_COMMUNITY): Payer: Self-pay

## 2022-01-16 MED ORDER — RINVOQ 30 MG PO TB24
1.0000 | ORAL_TABLET | Freq: Every day | ORAL | 5 refills | Status: DC
  Filled 2022-01-16: qty 30, 30d supply, fill #0

## 2022-01-17 ENCOUNTER — Other Ambulatory Visit (HOSPITAL_COMMUNITY): Payer: Self-pay

## 2022-01-27 ENCOUNTER — Other Ambulatory Visit (HOSPITAL_COMMUNITY): Payer: Self-pay

## 2022-01-30 ENCOUNTER — Other Ambulatory Visit (HOSPITAL_COMMUNITY): Payer: Self-pay

## 2022-02-02 ENCOUNTER — Encounter: Payer: Self-pay | Admitting: Gastroenterology

## 2022-02-04 ENCOUNTER — Encounter: Payer: Self-pay | Admitting: Gastroenterology

## 2022-02-07 ENCOUNTER — Other Ambulatory Visit: Payer: Self-pay | Admitting: Pharmacist

## 2022-02-07 ENCOUNTER — Other Ambulatory Visit (HOSPITAL_COMMUNITY): Payer: Self-pay

## 2022-02-07 MED ORDER — RINVOQ 30 MG PO TB24
1.0000 | ORAL_TABLET | Freq: Every day | ORAL | 5 refills | Status: DC
  Filled 2022-02-07: qty 30, 30d supply, fill #0
  Filled 2022-03-01 – 2022-03-18 (×2): qty 30, 30d supply, fill #1
  Filled 2022-04-14: qty 30, 30d supply, fill #2
  Filled 2022-05-25: qty 30, 30d supply, fill #3
  Filled 2022-07-11 – 2022-08-03 (×2): qty 30, 30d supply, fill #4
  Filled 2022-08-18 – 2022-10-03 (×3): qty 30, 30d supply, fill #5

## 2022-02-08 ENCOUNTER — Other Ambulatory Visit (HOSPITAL_COMMUNITY): Payer: Self-pay

## 2022-02-16 ENCOUNTER — Other Ambulatory Visit (HOSPITAL_COMMUNITY): Payer: Self-pay

## 2022-02-28 ENCOUNTER — Other Ambulatory Visit (HOSPITAL_COMMUNITY): Payer: Self-pay

## 2022-03-01 ENCOUNTER — Encounter: Payer: Self-pay | Admitting: Gastroenterology

## 2022-03-01 ENCOUNTER — Other Ambulatory Visit (HOSPITAL_COMMUNITY): Payer: Self-pay

## 2022-03-08 ENCOUNTER — Other Ambulatory Visit (HOSPITAL_COMMUNITY): Payer: Self-pay

## 2022-03-09 ENCOUNTER — Other Ambulatory Visit (HOSPITAL_COMMUNITY): Payer: Self-pay

## 2022-03-09 ENCOUNTER — Encounter: Payer: Self-pay | Admitting: Gastroenterology

## 2022-03-17 ENCOUNTER — Other Ambulatory Visit (HOSPITAL_COMMUNITY): Payer: Self-pay

## 2022-03-18 ENCOUNTER — Other Ambulatory Visit (HOSPITAL_COMMUNITY): Payer: Self-pay

## 2022-03-29 ENCOUNTER — Other Ambulatory Visit (HOSPITAL_COMMUNITY): Payer: Self-pay

## 2022-04-06 ENCOUNTER — Other Ambulatory Visit (HOSPITAL_COMMUNITY): Payer: Self-pay

## 2022-04-12 ENCOUNTER — Other Ambulatory Visit (HOSPITAL_COMMUNITY): Payer: Self-pay

## 2022-04-14 ENCOUNTER — Encounter: Payer: Self-pay | Admitting: Gastroenterology

## 2022-04-14 ENCOUNTER — Other Ambulatory Visit (HOSPITAL_COMMUNITY): Payer: Self-pay

## 2022-04-19 ENCOUNTER — Encounter: Payer: Self-pay | Admitting: Gastroenterology

## 2022-05-02 ENCOUNTER — Other Ambulatory Visit (HOSPITAL_COMMUNITY): Payer: Self-pay

## 2022-05-05 ENCOUNTER — Other Ambulatory Visit (HOSPITAL_COMMUNITY): Payer: Self-pay

## 2022-05-08 ENCOUNTER — Ambulatory Visit: Payer: Commercial Managed Care - PPO | Admitting: Family Medicine

## 2022-05-08 ENCOUNTER — Encounter: Payer: Self-pay | Admitting: Family Medicine

## 2022-05-08 VITALS — BP 124/100 | HR 101 | Temp 98.1°F | Ht 66.0 in | Wt 232.0 lb

## 2022-05-08 DIAGNOSIS — J069 Acute upper respiratory infection, unspecified: Secondary | ICD-10-CM | POA: Insufficient documentation

## 2022-05-08 MED ORDER — FLUTICASONE PROPIONATE 50 MCG/ACT NA SUSP
1.0000 | Freq: Every day | NASAL | 2 refills | Status: DC | PRN
Start: 2022-05-08 — End: 2022-05-08

## 2022-05-08 MED ORDER — FLUTICASONE PROPIONATE 50 MCG/ACT NA SUSP: 1.0000 | Freq: Every day | NASAL | 2 refills | Status: DC | PRN

## 2022-05-08 NOTE — Progress Notes (Signed)
Acute Office Visit  Subjective:     Patient ID: Theresa Farmer, female    DOB: 15-Apr-1980, 42 y.o.   MRN: [id number]  Chief Complaint  Patient presents with   Acute Visit    chills, cough, nasal congestion, headache (sx began Thursday night); feels better today. She was unable to take prescribed med (suppresses immune system) - JBG\\\   HPI Patient is in today for 6 days of mucopurulent runny nose, chills, cough, nasal congestion, headache, popping ears, subjective fever. Overall improving today. Denies shortness of breath, wheezing, chest pain, sinus pressure, sore throat, tooth pain, loss of taste/smell. Her grandson has been sick as well. Has tried Flonase, mucinex, sudafed, Tylenol cold and flu.  Review of Systems  All other systems reviewed and are negative.   Past Medical History:  Diagnosis Date   Allergy    Endometriosis    Gastritis    GERD (gastroesophageal reflux disease)    H/O seasonal allergies    IBS (irritable bowel syndrome)    Indeterminate colitis    Ulcerative colitis ([place])    Past Surgical History:  Procedure Laterality Date   COLONOSCOPY  11/2014, 03/2016   GANGLION CYST EXCISION Left 04/15/2018   Procedure: REMOVAL VOLAR GANGLION OF LEFT WRIST;  Surgeon: Leanora Cover, MD;  Location: [place];  Service: Orthopedics;  Laterality: Left;   TUBAL LIGATION     WISDOM TOOTH EXTRACTION     Current Outpatient Medications on File Prior to Visit  Medication Sig Dispense Refill   acetaminophen (TYLENOL) 500 MG tablet Take 2 tablets (1,000 mg total) by mouth every 6 (six) hours as needed. 30 tablet 0   cetirizine (ZYRTEC) 10 MG tablet Take 10 mg by mouth daily as needed for allergies.     Cholecalciferol (D-3-5) 5000 units capsule Take 2 capsules (10,000 Units total) by mouth daily. (Patient taking differently: Take 5,000 Units by mouth daily.)     polyethylene glycol (MIRALAX) 17 g packet Take 17 g by mouth daily. Titrate as needed (Patient  taking differently: Take 17 g by mouth daily as needed.) 14 each 0   Upadacitinib ER (RINVOQ) 30 MG TB24 Take 1 tablet by mouth daily. 30 tablet 5   vitamin C (ASCORBIC ACID) 500 MG tablet Take 1 tablet (500 mg total) by mouth daily.     Current Facility-Administered Medications on File Prior to Visit  Medication Dose Route Frequency Provider Last Rate Last Admin   levonorgestrel (MIRENA) 20 MCG/DAY IUD 1 each  1 each Intrauterine Once Chrzanowski, Jami B, NP       Allergies  Allergen Reactions   Keflex [Cephalexin]     hives        Objective:    BP (!) 124/100   Pulse (!) 101   Temp 98.1 F (36.7 C) (Oral)   Ht 5\' 6"  (1.676 m)   Wt 232 lb (105.2 kg)   LMP 04/27/2022 (Approximate) Comment: gets it off/on  SpO2 97%   BMI 37.45 kg/m    Physical Exam Vitals and nursing note reviewed.  Constitutional:      Appearance: Normal appearance. She is normal weight.  HENT:     Head: Normocephalic and atraumatic.     Right Ear: Tympanic membrane, ear canal and external ear normal.     Left Ear: Tympanic membrane, ear canal and external ear normal.     Nose: Congestion present.     Mouth/Throat:     Mouth: Mucous membranes are moist.  Pharynx: Oropharynx is clear.  Eyes:     Conjunctiva/sclera: Conjunctivae normal.  Cardiovascular:     Rate and Rhythm: Regular rhythm. Tachycardia present.     Pulses: Normal pulses.     Heart sounds: Normal heart sounds.  Pulmonary:     Effort: Pulmonary effort is normal.     Breath sounds: Normal breath sounds.  Musculoskeletal:     Cervical back: No tenderness.  Skin:    General: Skin is warm and dry.  Neurological:     General: No focal deficit present.     Mental Status: She is alert and oriented to person, place, and time. Mental status is at baseline.  Psychiatric:        Mood and Affect: Mood normal.        Behavior: Behavior normal.        Thought Content: Thought content normal.        Judgment: Judgment normal.     No  results found for any visits on 05/08/22.      Assessment & Plan:   Problem List Items Addressed This Visit       Respiratory   Viral URI with cough - Primary    Reassured patient that symptoms and exam findings are most consistent with a viral upper respiratory infection and explained lack of efficacy of antibiotics against viruses.  Discussed expected course and features suggestive of secondary bacterial infection.  Continue supportive care. Increase fluid intake with water or electrolyte solution like pedialyte. Encouraged acetaminophen as needed for fever/pain. Encouraged salt water gargling, chloraseptic spray and throat lozenges. Encouraged OTC guaifenesin. Encouraged saline sinus flushes and/or neti with humidified air.         Meds ordered this encounter  Medications   DISCONTD: fluticasone (FLONASE) 50 MCG/ACT nasal spray    Sig: Place 1 spray into both nostrils daily as needed for allergies.    Dispense:  48 g    Refill:  2   fluticasone (FLONASE) 50 MCG/ACT nasal spray    Sig: Place 1 spray into both nostrils daily as needed for allergies.    Dispense:  48 g    Refill:  2    Order Specific Question:   Supervising Provider    Answer:   Jenna Luo T [id number]    Return if symptoms worsen or fail to improve.  Rubie Maid, FNP

## 2022-05-08 NOTE — Assessment & Plan Note (Signed)

## 2022-05-08 NOTE — Patient Instructions (Signed)

## 2022-05-09 ENCOUNTER — Other Ambulatory Visit (HOSPITAL_COMMUNITY): Payer: Self-pay

## 2022-05-24 ENCOUNTER — Other Ambulatory Visit (HOSPITAL_COMMUNITY): Payer: Self-pay

## 2022-05-25 ENCOUNTER — Other Ambulatory Visit (HOSPITAL_COMMUNITY): Payer: Self-pay

## 2022-06-05 ENCOUNTER — Other Ambulatory Visit (HOSPITAL_COMMUNITY): Payer: Self-pay

## 2022-06-07 ENCOUNTER — Other Ambulatory Visit (HOSPITAL_COMMUNITY): Payer: Self-pay

## 2022-06-17 ENCOUNTER — Other Ambulatory Visit (HOSPITAL_COMMUNITY): Payer: Self-pay

## 2022-06-23 ENCOUNTER — Other Ambulatory Visit (HOSPITAL_COMMUNITY): Payer: Self-pay

## 2022-07-07 ENCOUNTER — Other Ambulatory Visit (HOSPITAL_COMMUNITY): Payer: Self-pay

## 2022-07-11 ENCOUNTER — Other Ambulatory Visit (HOSPITAL_COMMUNITY): Payer: Self-pay

## 2022-07-18 ENCOUNTER — Other Ambulatory Visit (HOSPITAL_COMMUNITY): Payer: Self-pay

## 2022-07-26 ENCOUNTER — Ambulatory Visit: Payer: Commercial Managed Care - PPO | Admitting: Radiology

## 2022-07-26 ENCOUNTER — Ambulatory Visit: Payer: Commercial Managed Care - PPO | Attending: Family Medicine | Admitting: Pharmacist

## 2022-07-26 VITALS — BP 120/84

## 2022-07-26 DIAGNOSIS — B9689 Other specified bacterial agents as the cause of diseases classified elsewhere: Secondary | ICD-10-CM

## 2022-07-26 DIAGNOSIS — N898 Other specified noninflammatory disorders of vagina: Secondary | ICD-10-CM | POA: Diagnosis not present

## 2022-07-26 DIAGNOSIS — N760 Acute vaginitis: Secondary | ICD-10-CM | POA: Diagnosis not present

## 2022-07-26 DIAGNOSIS — Z79899 Other long term (current) drug therapy: Secondary | ICD-10-CM

## 2022-07-26 DIAGNOSIS — Z30431 Encounter for routine checking of intrauterine contraceptive device: Secondary | ICD-10-CM

## 2022-07-26 DIAGNOSIS — M549 Dorsalgia, unspecified: Secondary | ICD-10-CM | POA: Diagnosis not present

## 2022-07-26 LAB — WET PREP FOR TRICH, YEAST, CLUE

## 2022-07-26 MED ORDER — METRONIDAZOLE 500 MG PO TABS: 500.0000 mg | ORAL_TABLET | Freq: Two times a day (BID) | ORAL | 0 refills | Status: DC

## 2022-07-26 NOTE — Progress Notes (Signed)
      Subjective: Theresa Farmer is a 42 y.o. female who complains of daily spotting with IUD x 3 months, has irregular spotting for 2 months before that. Now also has a vaginal odor.    Review of Systems  All other systems reviewed and are negative.   Past Medical History:  Diagnosis Date   Allergy    Endometriosis    Gastritis    GERD (gastroesophageal reflux disease)    H/O seasonal allergies    IBS (irritable bowel syndrome)    Indeterminate colitis    Ulcerative colitis ([facility])       Objective:  Today's Vitals   07/26/22 1113  BP: 120/84   There is no height or weight on file to calculate BMI.   -General: no acute distress -Vulva: without lesions or discharge -Vagina: foul smelling bloody discharge present, aptima swab and wet prep obtained -Cervix: no lesion or discharge, no CMT. IUD strings seen 3cm from os -Perineum: no lesions -Uterus: Mobile, non tender -Adnexa: no masses or tenderness   Microscopic wet-mount exam shows clue cells.   Raynelle Fanning, CMA present for exam  Assessment:/Plan:   1. Back pain, unspecified back location, unspecified back pain laterality, unspecified chronicity - Urinalysis,Complete w/RFL Culture  2. IUD check up - US Transvaginal Non-OB; Future  3. BV (bacterial vaginosis) - metroNIDAZOLE (FLAGYL) 500 MG tablet; Take 1 tablet (500 mg total) by mouth 2 (two) times daily.  Dispense: 14 tablet; Refill: 0  4. Vaginal odor - WET PREP FOR TRICH, YEAST, CLUE    Will contact patient with results of testing completed today. Avoid intercourse until symptoms are resolved. Safe sex encouraged. Avoid the use of soaps or perfumed products in the peri area. Avoid tub baths and sitting in sweaty or wet clothing for prolonged periods of time.

## 2022-07-26 NOTE — Progress Notes (Signed)
  S: Patient presents today for review of their specialty medication.   Patient is currently taking Rinvoq for UC. Patient is managed by Dr. Adela Lank for this.   Dosing: Adult  Note: May be used as monotherapy or in combination with methotrexate or other nonbiologic disease-modifying antirheumatic drugs (DMARDs); use in combination with biologic DMARDS or potent immunosuppressants (eg, azathioprine, cyclosporine) is not recommended. Do not initiate therapy in patients with an absolute lymphocyte count <500/mm3, ANC <1,000/mm3, or hemoglobin <8 g/dL. UC: Oral: 15 mg once daily.  Adherence: confirms  Efficacy: reports that she feels similarly to [facility] regarding symptoms. She will have a colonoscopy upcoming to see if the Rinvoq is effective.   Monitoring: S/sx thromboembolism: none  S/sx malignancy: none  S/sx of infection: none   Current adverse effects: none    O:     Lab Results  Component Value Date   WBC 8.3 12/26/2021   HGB 11.8 (L) 12/26/2021   HCT 35.8 (L) 12/26/2021   MCV 81.9 12/26/2021   PLT 310.0 12/26/2021      Chemistry      Component Value Date/Time   NA 137 12/26/2021 0809   K 3.7 12/26/2021 0809   CL 103 12/26/2021 0809   CO2 27 12/26/2021 0809   BUN 11 12/26/2021 0809   CREATININE 1.07 12/26/2021 0809   CREATININE 1.09 (H) 10/18/2020 1635      Component Value Date/Time   CALCIUM 8.6 12/26/2021 0809   ALKPHOS 67 12/26/2021 0809   AST 16 12/26/2021 0809   ALT 16 12/26/2021 0809   BILITOT 0.5 12/26/2021 0809       Lab Results  Component Value Date   CHOL 175 11/17/2021   HDL 43.30 11/17/2021   LDLCALC 119 (H) 11/17/2021   TRIG 63.0 11/17/2021   CHOLHDL 4 11/17/2021     A/P: 1. Medication review: patient currently taking Rinvoq for UC. Reviewed the medication with the patient, including the following: Rinvoq is a medication used to treat UC. Administer with or without food. Swallow tablet whole; do not crush, split, or chew. Possible  adverse effects include increased risk of infection, GI upset, hematologic toxicity, hepatic effects, lipid abnormalities, increased risk of malignancy, thromboembolism. Avoid live vaccinations. No recommendations for any changes.  Butch Penny, PharmD, Patsy [place], CPP Clinical Pharmacist [facility] [phone]

## 2022-07-27 ENCOUNTER — Other Ambulatory Visit (HOSPITAL_COMMUNITY): Payer: Self-pay

## 2022-07-28 DIAGNOSIS — B9689 Other specified bacterial agents as the cause of diseases classified elsewhere: Secondary | ICD-10-CM

## 2022-07-28 LAB — URINALYSIS, COMPLETE W/RFL CULTURE
Bilirubin Urine: NEGATIVE
Glucose, UA: NEGATIVE
Hyaline Cast: NONE SEEN /LPF
Ketones, ur: NEGATIVE
Nitrites, Initial: NEGATIVE
Protein, ur: NEGATIVE
Specific Gravity, Urine: 1.01 (ref 1.001–1.035)
pH: 5.5 (ref 5.0–8.0)

## 2022-07-28 LAB — URINE CULTURE
MICRO NUMBER:: 15044213
SPECIMEN QUALITY:: ADEQUATE

## 2022-07-28 LAB — CULTURE INDICATED

## 2022-07-28 MED ORDER — METRONIDAZOLE 0.75 % VA GEL: 1.0000 | Freq: Every day | VAGINAL | 0 refills | Status: AC

## 2022-07-28 NOTE — Telephone Encounter (Signed)
Ok to send metrogel

## 2022-07-29 ENCOUNTER — Ambulatory Visit (HOSPITAL_COMMUNITY)
Admission: EM | Admit: 2022-07-29 | Discharge: 2022-07-29 | Disposition: A | Discharge status: Home / Self Care | Payer: Commercial Managed Care - PPO | Attending: Internal Medicine | Admitting: Internal Medicine

## 2022-07-29 ENCOUNTER — Encounter (HOSPITAL_COMMUNITY): Payer: Self-pay | Admitting: *Deleted

## 2022-07-29 ENCOUNTER — Other Ambulatory Visit: Payer: Self-pay

## 2022-07-29 DIAGNOSIS — R112 Nausea with vomiting, unspecified: Secondary | ICD-10-CM | POA: Diagnosis not present

## 2022-07-29 DIAGNOSIS — B9689 Other specified bacterial agents as the cause of diseases classified elsewhere: Secondary | ICD-10-CM

## 2022-07-29 DIAGNOSIS — N760 Acute vaginitis: Secondary | ICD-10-CM | POA: Diagnosis not present

## 2022-07-29 DIAGNOSIS — T50905A Adverse effect of unspecified drugs, medicaments and biological substances, initial encounter: Secondary | ICD-10-CM | POA: Diagnosis not present

## 2022-07-29 MED ORDER — ONDANSETRON 4 MG PO TBDP
4.0000 mg | ORAL_TABLET | Freq: Once | ORAL | Status: AC
  Administered 2022-07-29: 4 mg via ORAL

## 2022-07-29 MED ORDER — ONDANSETRON 4 MG PO TBDP: 4.0000 mg | ORAL_TABLET | Freq: Three times a day (TID) | ORAL | 0 refills | Status: DC | PRN

## 2022-07-29 MED ORDER — ONDANSETRON 4 MG PO TBDP
ORAL_TABLET | ORAL | Status: AC
  Filled 2022-07-29: qty 1

## 2022-07-29 NOTE — ED Triage Notes (Signed)
Pt reports she was started on oral meds for BV on WED. Pt developed Nausea, vomiting ,diarrhea and unable to eat or drink. Pt has motion sickness and lights bother her.

## 2022-07-29 NOTE — Discharge Instructions (Addendum)
Your symptoms are likely related to the use of Flagyl antibiotic.  Please take Zofran every 8 hours as needed for nausea and vomiting.  Drink plenty of fluids to stay well-hydrated.  Eat the BRAT diet until you are able to tolerate fluids and foods without nausea/vomiting.  I have documented the pill version of Flagyl as an intolerance in your chart. Continue using the vaginal Flagyl as prescribed.  If you develop any new or worsening symptoms or do not improve in the next 2 to 3 days, please return.  If your symptoms are severe, please go to the emergency room.  Follow-up with your primary care provider for further evaluation and management of your symptoms as well as ongoing wellness visits.  I hope you feel better!

## 2022-07-29 NOTE — ED Provider Notes (Signed)
[facility]    CSN: [id number] Arrival date & time: 07/29/22  1007      History   Chief Complaint Chief Complaint  Patient presents with   Nausea   Emesis    HPI Theresa Farmer is a 42 y.o. female.   Patient presents to urgent care for evaluation of nausea and vomiting that started abruptly overnight at 2am yesterday on Friday, July 28, 2022.  She states she started taking Flagyl oral antibiotic on Wednesday night July 26, 2022 and took her dose Wednesday night and Thursday morning.  She became nauseous and started to not feel very well during the day on Thursday (2 days ago) prior to onset of vomiting yesterday morning.  She has had multiple episodes of nonbilious/nonbloody emesis over the last 24 hours and has not been able to keep any food or fluids down without vomiting.  No recent known sick contacts with similar symptoms.  She has never had bacterial vaginitis in the past and has never taken Flagyl in the past.  She reports associated generalized abdominal discomfort but denies fever, rash, sore throat, viral upper respiratory infection symptoms, body aches, and dizziness.  She has a history of ulcerative colitis and takes Rinvoq.  She has not taken her Rinvoq for the last 2 to 3 days while taking antibiotic since this is an immunosuppressant.  Currently nauseous in clinic.  Denies chance of pregnancy.  No urinary symptoms, flank pain, or gross hematuria.  She attempted use of any over-the-counter medications before coming to urgent care for symptoms.  She has not had any alcohol to drink while taking Flagyl and states that she does not drink alcohol due to history of ulcerative colitis.  No recent known sick contacts with similar symptoms.   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   IBD (inflammatory bowel disease) 12/15/2014   Situational anxiety 12/15/2014   Class 2 obesity 01/11/2013   Unspecified constipation 01/11/2013    Past Surgical History:  Procedure Laterality Date   COLONOSCOPY  11/2014, 03/2016   GANGLION CYST EXCISION Left 04/15/2018   Procedure: REMOVAL VOLAR GANGLION OF LEFT WRIST;  Surgeon: Betha Loa, MD;  Location: [facility];  Service: Orthopedics;  Laterality: Left;   TUBAL LIGATION     WISDOM TOOTH EXTRACTION      OB History     Gravida  4   Para      Term      Preterm      AB      Living  4      SAB      IAB      Ectopic      Multiple      Live Births  4            Home Medications    Prior to Admission medications   Medication Sig Start Date End Date Taking? Authorizing Provider  acetaminophen (TYLENOL) 500 MG tablet Take 2 tablets (1,000 mg total) by mouth  every 6 (six) hours as needed. 09/09/21  Yes Carlisle Beers, FNP  metroNIDAZOLE (METROGEL) 0.75 % vaginal gel Place 1 Applicatorful vaginally at bedtime for 5 days. 07/28/22 08/02/22 Yes Chrzanowski, Jami B, NP  ondansetron (ZOFRAN-ODT) 4 MG disintegrating tablet Take 1 tablet (4 mg total) by mouth every 8 (eight) hours as needed for nausea or vomiting. 07/29/22  Yes Jase Himmelberger, Donavan Burnet, FNP  Upadacitinib ER (RINVOQ) 30 MG TB24 Take 1 tablet by mouth daily. 02/07/22  Yes Quentin Angst, MD  cetirizine (ZYRTEC) 10 MG tablet Take 10 mg by mouth daily as needed for allergies.    [provider]  Cholecalciferol (D-3-5) 5000 units capsule Take 2 capsules (10,000 Units total) by mouth daily. Patient taking differently: Take 5,000 Units by mouth daily. 06/15/17   Tomball, Velna Hatchet, MD  fluticasone  [facility]) 50 MCG/ACT nasal spray Place 1 spray into both nostrils daily as needed for allergies. 05/08/22   Park Meo, FNP  polyethylene glycol (MIRALAX) 17 g packet Take 17 g by mouth daily. Titrate as needed Patient taking differently: Take 17 g by mouth daily as needed. 11/30/20   Armbruster, Willaim Rayas, MD  vitamin C (ASCORBIC ACID) 500 MG tablet Take 1 tablet (500 mg total) by mouth daily. 06/15/17   Salley Scarlet, MD    Family History Family History  Problem Relation Age of Onset   Rheum arthritis Mother    Immunodeficiency Father    Bullous pemphigoid Father    Lupus Sister    Breast cancer Maternal Grandmother    Colon cancer Other        cousins and uncles   Diabetes Other        several maternal relatives   Hypertension Other        several paternal and maternal relatives   Irritable bowel syndrome Other        mother   Colon polyps Neg Hx    Esophageal cancer Neg Hx    Rectal cancer Neg Hx    Stomach cancer Neg Hx     Social History Social History   Tobacco Use   Smoking status: Never   Smokeless tobacco: Never  Vaping Use   Vaping Use: Never used  Substance Use Topics   Alcohol use: Not Currently   Drug use: Yes    Types: Marijuana     Allergies   Keflex [cephalexin]   Review of Systems Review of Systems  Gastrointestinal:  Positive for vomiting.  Per HPI   Physical Exam Triage Vital Signs ED Triage Vitals  Enc Vitals Group     BP 07/29/22 1048 (!) 138/95     Pulse Rate 07/29/22 1048 97     Resp 07/29/22 1048 20     Temp 07/29/22 1048 98.1 F (36.7 C)     Temp src --      SpO2 07/29/22 1048 99 %     Weight --      Height --      Head Circumference --      Peak Flow --      Pain Score 07/29/22 1046 6     Pain Loc --      Pain Edu? --      Excl. in GC? --    No data found.  Updated Vital Signs BP (!) 138/95   Pulse 97   Temp 98.1 F (36.7 C)   Resp 20   LMP  (LMP Unknown)   SpO2 99%  Visual Acuity Right Eye  Distance:   Left Eye Distance:   Bilateral Distance:    Right Eye Near:   Left Eye Near:    Bilateral Near:     Physical Exam Vitals and nursing note reviewed.  Constitutional:      Appearance: She is not ill-appearing or toxic-appearing.  HENT:     Head: Normocephalic and atraumatic.     Right Ear: Hearing and external ear normal.     Left Ear: Hearing and external ear normal.     Nose: Nose normal.     Mouth/Throat:     Lips: Pink.  Eyes:     General: Lids are normal. Vision grossly intact. Gaze aligned appropriately.     Extraocular Movements: Extraocular movements intact.     Conjunctiva/sclera: Conjunctivae normal.  Pulmonary:     Effort: Pulmonary effort is normal.  Abdominal:     General: Bowel sounds are normal.     Palpations: Abdomen is soft.     Tenderness: There is generalized abdominal tenderness. There is no right CVA tenderness, left CVA tenderness or guarding.  Musculoskeletal:     Cervical back: Neck supple.  Skin:    General: Skin is warm and dry.     Capillary Refill: Capillary refill takes less than 2 seconds.     Findings: No rash.  Neurological:     General: No focal deficit present.     Mental Status: She is alert and oriented to person, place, and time. Mental status is at baseline.     Cranial Nerves: No dysarthria or facial asymmetry.  Psychiatric:        Mood and Affect: Mood normal.        Speech: Speech normal.        Behavior: Behavior normal.        Thought Content: Thought content normal.        Judgment: Judgment normal.      UC Treatments / Results  Labs (all labs ordered are listed, but only abnormal results are displayed) Labs Reviewed - No data to display  EKG   Radiology No results found.  Procedures Procedures (including critical care time)  Medications Ordered in UC Medications  ondansetron (ZOFRAN-ODT) disintegrating tablet 4 mg (4 mg Oral Given 07/29/22 1054)    Initial Impression / Assessment and Plan / UC  Course  I have reviewed the triage vital signs and the nursing notes.  Pertinent labs & imaging results that were available during my care of the patient were reviewed by me and considered in my medical decision making (see chart for details).   1.  Medication reaction, nausea and vomiting, bacterial vaginosis Presentation is consistent with medication reaction and likely intolerance to Flagyl pills.  This has been documented as an intolerance in patient's chart.  She was able to message her OB/GYN and has since been placed on Flagyl gel.  Advised to continue taking Flagyl gel to the vagina for the next 5 nights as prescribed.  HEENT exam is stable.  Patient given Zofran 4 mg in clinic with significant improvement in nausea and vomiting.  Able to tolerate sips of fluids prior to discharge from urgent care clinic.  Abdominal exam is without peritoneal signs.  Advised to drink plenty of fluids to stay well-hydrated.  Encouraged to eat the brat diet and increase diet as tolerated back to normal.  Follow-up with OB/GYN as needed.  Discussed physical exam and available lab work findings in clinic with patient.  Counseled patient regarding appropriate use of medications and potential side effects for all medications recommended or prescribed today. Discussed red flag signs and symptoms of worsening condition,when to call the PCP office, return to urgent care, and when to seek higher level of care in the emergency department. Patient verbalizes understanding and agreement with plan. All questions answered. Patient discharged in stable condition.    Final Clinical Impressions(s) / UC Diagnoses   Final diagnoses:  Medication reaction, initial encounter  Nausea and vomiting, unspecified vomiting type  Bacterial vaginosis     Discharge Instructions      Your symptoms are likely related to the use of Flagyl antibiotic.  Please take Zofran every 8 hours as needed for nausea and vomiting.  Drink plenty  of fluids to stay well-hydrated.  Eat the BRAT diet until you are able to tolerate fluids and foods without nausea/vomiting.  I have documented the pill version of Flagyl as an intolerance in your chart. Continue using the vaginal Flagyl as prescribed.  If you develop any new or worsening symptoms or do not improve in the next 2 to 3 days, please return.  If your symptoms are severe, please go to the emergency room.  Follow-up with your primary care provider for further evaluation and management of your symptoms as well as ongoing wellness visits.  I hope you feel better!     ED Prescriptions     Medication Sig Dispense Auth. Provider   ondansetron (ZOFRAN-ODT) 4 MG disintegrating tablet Take 1 tablet (4 mg total) by mouth every 8 (eight) hours as needed for nausea or vomiting. 20 tablet Carlisle Beers, FNP      PDMP not reviewed this encounter.   Carlisle Beers, [place] 07/29/22 1139

## 2022-08-01 ENCOUNTER — Encounter: Payer: Self-pay | Admitting: Family Medicine

## 2022-08-01 ENCOUNTER — Ambulatory Visit: Payer: Commercial Managed Care - PPO | Admitting: Family Medicine

## 2022-08-01 ENCOUNTER — Other Ambulatory Visit (HOSPITAL_COMMUNITY): Payer: Self-pay

## 2022-08-01 VITALS — BP 126/80 | HR 70 | Temp 98.3°F | Ht 66.0 in | Wt 234.6 lb

## 2022-08-01 DIAGNOSIS — M5432 Sciatica, left side: Secondary | ICD-10-CM | POA: Diagnosis not present

## 2022-08-01 LAB — CBC WITH DIFFERENTIAL/PLATELET
Basophils Absolute: 24 cells/uL (ref 0–200)
Lymphs Abs: 2080 cells/uL (ref 850–3900)
MPV: 9.7 fL (ref 7.5–12.5)
Neutrophils Relative %: 62.2 %
RBC: 4.61 10*6/uL (ref 3.80–5.10)
WBC: 8 10*3/uL (ref 3.8–10.8)

## 2022-08-01 NOTE — Progress Notes (Signed)
Subjective:    Patient ID: Theresa Farmer, female    DOB: 30-Jan-1981, 42 y.o.   MRN: [id number]  Back Pain   Patient has a history of inflammatory bowel disease.  She has been diagnosed with ulcerative colitis.  Over the last 2 months she has been having low back pain.  She reports muscle spasm with her "back will go out on her".  She will have the inability to fully extend her back and difficulty walking due to the muscle spasms.  She also reports left-sided sciatica with nerve pain radiating into her left gluteus and down her left leg into her left foot.  She denies any bowel or bladder incontinence or saddle anesthesia. Past Medical History:  Diagnosis Date   Allergy    Endometriosis    Gastritis    GERD (gastroesophageal reflux disease)    H/O seasonal allergies    IBS (irritable bowel syndrome)    Indeterminate colitis    Ulcerative colitis (HCC)    Past Surgical History:  Procedure Laterality Date   COLONOSCOPY  11/2014, 03/2016   GANGLION CYST EXCISION Left 04/15/2018   Procedure: REMOVAL VOLAR GANGLION OF LEFT WRIST;  Surgeon: Betha Loa, MD;  Location: [facility];  Service: Orthopedics;  Laterality: Left;   TUBAL LIGATION     WISDOM TOOTH EXTRACTION     Current Outpatient Medications on File Prior to Visit  Medication Sig Dispense Refill   acetaminophen (TYLENOL) 500 MG tablet Take 2 tablets (1,000 mg total) by mouth every 6 (six) hours as needed. 30 tablet 0   cetirizine (ZYRTEC) 10 MG tablet Take 10 mg by mouth daily as needed for allergies.     Cholecalciferol (D-3-5) 5000 units capsule Take 2 capsules (10,000 Units total) by mouth daily. (Patient taking differently: Take 5,000 Units by mouth daily.)     fluticasone (FLONASE) 50 MCG/ACT nasal spray Place 1 spray into both nostrils daily as needed for allergies. 48 g 2   ondansetron (ZOFRAN-ODT) 4 MG disintegrating tablet Take 1 tablet (4 mg total) by mouth every 8 (eight) hours as needed for nausea or  vomiting. 20 tablet 0   polyethylene glycol (MIRALAX) 17 g packet Take 17 g by mouth daily. Titrate as needed (Patient taking differently: Take 17 g by mouth daily as needed.) 14 each 0   Upadacitinib ER (RINVOQ) 30 MG TB24 Take 1 tablet by mouth daily. 30 tablet 5   vitamin C (ASCORBIC ACID) 500 MG tablet Take 1 tablet (500 mg total) by mouth daily.     metroNIDAZOLE (METROGEL) 0.75 % vaginal gel Place 1 Applicatorful vaginally at bedtime for 5 days. 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  Tobacco Use   Smoking status: Never   Smokeless tobacco: Never  Vaping Use   Vaping Use: Never used  Substance and Sexual Activity   Alcohol use: Not Currently   Drug use: Yes  Types: Marijuana   Sexual activity: Yes    Partners: Male    Birth control/protection: Surgical    Comment: tubal  Other Topics Concern   Not on file  Social History Narrative   Not on file   Social Determinants of Health   Financial Resource Strain: Not on file  Food Insecurity: Not on file  Transportation Needs: Not on file  Physical Activity: Not on file  Stress: Not on file  Social Connections: Not on file  Intimate Partner Violence: Not on file      Review of Systems  Musculoskeletal:  Positive for back pain.  All other systems reviewed and are negative.      Objective:   Physical Exam Vitals reviewed.  Constitutional:      General: She is not in acute distress.    Appearance:  Normal appearance. She is obese. She is not ill-appearing, toxic-appearing or diaphoretic.  Cardiovascular:     Rate and Rhythm: Normal rate and regular rhythm.     Heart sounds: Normal heart sounds. No murmur heard.    No friction rub. No gallop.  Pulmonary:     Effort: Pulmonary effort is normal. No respiratory distress.     Breath sounds: Normal breath sounds. No wheezing, rhonchi or rales.  Musculoskeletal:     Lumbar back: Spasms present. No tenderness or bony tenderness. Decreased range of motion. Negative right straight leg raise test and negative left straight leg raise test.     Right lower leg: No edema.     Left lower leg: No edema.  Neurological:     Mental Status: She is alert.           Assessment & Plan:  Left sided sciatica - Plan: DG Lumbar Spine Complete, CBC with Differential/Platelet, BASIC METABOLIC PANEL WITH GFR Start by obtaining x-ray of the lumbar spine to evaluate for degenerative disc disease.  Most likely will treat the patient with muscle relaxers and anti-inflammatories versus physical therapy unless the x-ray is markedly abnormal.

## 2022-08-02 LAB — BASIC METABOLIC PANEL WITH GFR
BUN/Creatinine Ratio: 9 (calc) (ref 6–22)
BUN: 11 mg/dL (ref 7–25)
CO2: 26 mmol/L (ref 20–32)
Calcium: 8.5 mg/dL — ABNORMAL LOW (ref 8.6–10.2)
Chloride: 105 mmol/L (ref 98–110)
Creat: 1.23 mg/dL — ABNORMAL HIGH (ref 0.50–0.99)
Glucose, Bld: 93 mg/dL (ref 65–99)
Potassium: 4.2 mmol/L (ref 3.5–5.3)
Sodium: 139 mmol/L (ref 135–146)
eGFR: 57 mL/min/{1.73_m2} — ABNORMAL LOW (ref >=60)

## 2022-08-02 LAB — CBC WITH DIFFERENTIAL/PLATELET
Absolute Monocytes: 872 cells/uL (ref 200–950)
Basophils Relative: 0.3 %
Eosinophils Absolute: 48 cells/uL (ref 15–500)
Eosinophils Relative: 0.6 %
HCT: 38.4 % (ref 35.0–45.0)
Hemoglobin: 12.4 g/dL (ref 11.7–15.5)
MCH: 26.9 pg — ABNORMAL LOW (ref 27.0–33.0)
MCHC: 32.3 g/dL (ref 32.0–36.0)
MCV: 83.3 fL (ref 80.0–100.0)
Monocytes Relative: 10.9 %
Neutro Abs: 4976 cells/uL (ref 1500–7800)
Platelets: 296 10*3/uL (ref 140–400)
RDW: 12.1 % (ref 11.0–15.0)
Total Lymphocyte: 26 %

## 2022-08-03 ENCOUNTER — Other Ambulatory Visit (HOSPITAL_COMMUNITY): Payer: Self-pay

## 2022-08-04 ENCOUNTER — Ambulatory Visit
Admission: RE | Admit: 2022-08-04 | Discharge: 2022-08-04 | Disposition: A | Discharge status: Home / Self Care | Payer: Commercial Managed Care - PPO | Source: Ambulatory Visit | Attending: Family Medicine | Admitting: Family Medicine

## 2022-08-04 DIAGNOSIS — M5432 Sciatica, left side: Secondary | ICD-10-CM

## 2022-08-04 DIAGNOSIS — M545 Low back pain, unspecified: Secondary | ICD-10-CM | POA: Diagnosis not present

## 2022-08-07 ENCOUNTER — Other Ambulatory Visit (HOSPITAL_COMMUNITY): Payer: Self-pay

## 2022-08-07 ENCOUNTER — Other Ambulatory Visit: Payer: Self-pay

## 2022-08-07 DIAGNOSIS — M5432 Sciatica, left side: Secondary | ICD-10-CM

## 2022-08-07 MED ORDER — MELOXICAM 15 MG PO TABS
15.0000 mg | ORAL_TABLET | Freq: Every day | ORAL | 0 refills | Status: DC
Start: 2022-08-07 — End: 2022-09-14

## 2022-08-07 MED ORDER — CYCLOBENZAPRINE HCL 10 MG PO TABS
10.0000 mg | ORAL_TABLET | Freq: Three times a day (TID) | ORAL | 0 refills | Status: DC | PRN
Start: 2022-08-07 — End: 2023-07-01

## 2022-08-18 ENCOUNTER — Other Ambulatory Visit: Payer: Self-pay

## 2022-08-21 ENCOUNTER — Other Ambulatory Visit: Payer: Self-pay

## 2022-08-30 ENCOUNTER — Other Ambulatory Visit (HOSPITAL_COMMUNITY): Payer: Self-pay

## 2022-08-31 ENCOUNTER — Other Ambulatory Visit: Payer: Self-pay

## 2022-09-07 ENCOUNTER — Other Ambulatory Visit: Payer: Commercial Managed Care - PPO | Admitting: Radiology

## 2022-09-07 ENCOUNTER — Other Ambulatory Visit: Payer: Commercial Managed Care - PPO

## 2022-09-12 ENCOUNTER — Other Ambulatory Visit (HOSPITAL_COMMUNITY): Payer: Self-pay

## 2022-09-13 ENCOUNTER — Other Ambulatory Visit (HOSPITAL_COMMUNITY): Payer: Self-pay

## 2022-09-14 ENCOUNTER — Other Ambulatory Visit: Payer: Self-pay | Admitting: Family Medicine

## 2022-09-14 DIAGNOSIS — M5432 Sciatica, left side: Secondary | ICD-10-CM

## 2022-09-21 ENCOUNTER — Other Ambulatory Visit (HOSPITAL_COMMUNITY): Payer: Self-pay

## 2022-09-29 ENCOUNTER — Other Ambulatory Visit: Payer: Self-pay | Admitting: Family Medicine

## 2022-09-29 DIAGNOSIS — Z1231 Encounter for screening mammogram for malignant neoplasm of breast: Secondary | ICD-10-CM

## 2022-10-03 ENCOUNTER — Other Ambulatory Visit: Payer: Self-pay

## 2022-10-03 ENCOUNTER — Other Ambulatory Visit (HOSPITAL_COMMUNITY): Payer: Self-pay

## 2022-10-05 ENCOUNTER — Other Ambulatory Visit (HOSPITAL_COMMUNITY): Payer: Self-pay

## 2022-10-11 ENCOUNTER — Ambulatory Visit
Admission: RE | Admit: 2022-10-11 | Discharge: 2022-10-11 | Disposition: A | Discharge status: Home / Self Care | Payer: Commercial Managed Care - PPO | Source: Ambulatory Visit | Attending: Family Medicine | Admitting: Family Medicine

## 2022-10-11 DIAGNOSIS — Z1231 Encounter for screening mammogram for malignant neoplasm of breast: Secondary | ICD-10-CM

## 2022-10-13 ENCOUNTER — Other Ambulatory Visit: Payer: Self-pay | Admitting: Family Medicine

## 2022-10-13 DIAGNOSIS — R928 Other abnormal and inconclusive findings on diagnostic imaging of breast: Secondary | ICD-10-CM

## 2022-10-19 ENCOUNTER — Other Ambulatory Visit (HOSPITAL_COMMUNITY): Payer: Self-pay

## 2022-10-26 ENCOUNTER — Other Ambulatory Visit: Payer: Commercial Managed Care - PPO

## 2022-10-30 ENCOUNTER — Other Ambulatory Visit (HOSPITAL_COMMUNITY): Payer: Self-pay

## 2022-11-01 ENCOUNTER — Other Ambulatory Visit (HOSPITAL_COMMUNITY): Payer: Self-pay

## 2022-11-01 ENCOUNTER — Encounter (HOSPITAL_COMMUNITY): Payer: Self-pay

## 2022-11-09 ENCOUNTER — Other Ambulatory Visit: Payer: Self-pay

## 2022-11-13 ENCOUNTER — Other Ambulatory Visit: Payer: Commercial Managed Care - PPO

## 2022-11-24 ENCOUNTER — Other Ambulatory Visit: Payer: Self-pay

## 2022-11-28 ENCOUNTER — Other Ambulatory Visit: Payer: Self-pay | Admitting: Family Medicine

## 2022-11-28 ENCOUNTER — Other Ambulatory Visit: Payer: Commercial Managed Care - PPO

## 2022-11-28 ENCOUNTER — Ambulatory Visit
Admission: RE | Admit: 2022-11-28 | Discharge: 2022-11-28 | Disposition: A | Discharge status: Home / Self Care | Payer: Commercial Managed Care - PPO | Source: Ambulatory Visit | Attending: Family Medicine | Admitting: Family Medicine

## 2022-11-28 DIAGNOSIS — N6012 Diffuse cystic mastopathy of left breast: Secondary | ICD-10-CM | POA: Diagnosis not present

## 2022-11-28 DIAGNOSIS — R928 Other abnormal and inconclusive findings on diagnostic imaging of breast: Secondary | ICD-10-CM

## 2022-11-28 DIAGNOSIS — N6310 Unspecified lump in the right breast, unspecified quadrant: Secondary | ICD-10-CM

## 2022-11-28 DIAGNOSIS — N6313 Unspecified lump in the right breast, lower outer quadrant: Secondary | ICD-10-CM | POA: Diagnosis not present

## 2022-12-21 ENCOUNTER — Other Ambulatory Visit (HOSPITAL_COMMUNITY): Payer: Self-pay

## 2023-01-04 ENCOUNTER — Encounter: Payer: Self-pay | Admitting: Gastroenterology

## 2023-01-05 ENCOUNTER — Other Ambulatory Visit: Payer: Self-pay

## 2023-01-23 ENCOUNTER — Other Ambulatory Visit (HOSPITAL_COMMUNITY): Payer: Self-pay

## 2023-01-23 ENCOUNTER — Other Ambulatory Visit: Payer: Self-pay | Admitting: Gastroenterology

## 2023-01-23 ENCOUNTER — Other Ambulatory Visit: Payer: Self-pay

## 2023-01-23 MED ORDER — RINVOQ 30 MG PO TB24
1.0000 | ORAL_TABLET | Freq: Every day | ORAL | 0 refills | Status: DC
  Filled 2023-01-24: qty 30, 30d supply, fill #0

## 2023-01-23 NOTE — Progress Notes (Signed)
Specialty Pharmacy Ongoing Clinical Assessment Note  Theresa Farmer is a 42 y.o. female who is being followed by the specialty pharmacy service for RxSp Ulcerative Colitis   Patient's specialty medication(s) reviewed today: Upadacitinib   Missed doses in the last 4 weeks: 0   Patient/Caregiver did not have any additional questions or concerns.   Therapeutic benefit summary: Patient is achieving benefit   Adverse events/side effects summary: No adverse events/side effects   Patient's therapy is appropriate to: Continue    Goals Addressed             This Visit's Progress    Minimize recurrence of flares       Patient is  unable to determine if she is well controlled without a colonoscopy and she has not had one recently to assess . Patient will maintain adherence         Follow up:  6 months  Servando Snare Specialty Pharmacist

## 2023-01-23 NOTE — Progress Notes (Signed)
Specialty Pharmacy Refill Coordination Note  Theresa Farmer is a 42 y.o. female contacted today regarding refills of specialty medication(s) Upadacitinib   Patient requested [name] at [facility] at [place] date: 01/26/23   Medication will be filled on 01/26/23. *Pending refill request*

## 2023-01-24 ENCOUNTER — Other Ambulatory Visit: Payer: Self-pay | Admitting: Pharmacist

## 2023-01-24 ENCOUNTER — Other Ambulatory Visit: Payer: Self-pay

## 2023-01-24 MED ORDER — RINVOQ 30 MG PO TB24
1.0000 | ORAL_TABLET | Freq: Every day | ORAL | 0 refills | Status: DC
  Filled 2023-01-24: qty 30, 30d supply, fill #0

## 2023-01-25 ENCOUNTER — Other Ambulatory Visit: Payer: Self-pay

## 2023-01-26 ENCOUNTER — Other Ambulatory Visit (HOSPITAL_COMMUNITY): Payer: Self-pay

## 2023-01-26 ENCOUNTER — Telehealth: Payer: Self-pay | Admitting: Pharmacy Technician

## 2023-01-26 ENCOUNTER — Other Ambulatory Visit: Payer: Self-pay

## 2023-01-26 NOTE — Telephone Encounter (Signed)
Pharmacy Patient Advocate Encounter   Received notification from Patient Pharmacy that prior authorization for [facility] 30MG  is required/requested.   Insurance verification completed.   The patient is insured through [facility] .   Per test claim: PA required; PA submitted to above mentioned insurance via CoverMyMeds Key/confirmation #/EOC [id number] Status is pending

## 2023-01-29 ENCOUNTER — Other Ambulatory Visit: Payer: Self-pay

## 2023-01-29 ENCOUNTER — Other Ambulatory Visit (HOSPITAL_COMMUNITY): Payer: Self-pay

## 2023-01-29 NOTE — Telephone Encounter (Signed)
Dr Adela Lank-  Patient was last seen 09/2021 for colitis follow up. At that time, she was told to continue Rinvoq 30 mg until September 2023, then decrease to 15 mg thereafter with office follow up in 4 months as well as repeat colonoscopy. Unfortunately, office visit and colonoscopy not completed. As below, patient requested refills of Rinvoq 30 mg which was approved but with a start date of 03/23/23. Insurance seems to want a reloading dosage at this point because she has not had medication filled since 10/03/22.  I will work to get her on the schedule for follow up. Can she see an extender or does she need to see you specifically at this point?  _____________________________  Ricke Hey, CPhT  Missy Sabins, [place] hours ago (1:53 PM)   This PA has been approved but with a start date of 1.31.25 for the 30mg . Pt has not filled medications since 8.13.24. To insurance this is showing non compliance, as if patient must do a restart first of loading (45mg ). Pt last office visit was a year. Will need updated chart notes to support request for getting 30mg  approved.

## 2023-01-29 NOTE — Telephone Encounter (Signed)
Pharmacy Patient Advocate Encounter  Received notification from [facility] that Prior Authorization for RINVOQ 30MG  has been APPROVED from 12.7.24 to 5.31.25   PA #/Case ID/Reference #:

## 2023-01-30 ENCOUNTER — Other Ambulatory Visit: Payer: Self-pay

## 2023-01-30 NOTE — Telephone Encounter (Signed)
Thanks Dottie, Yes she is overdue for follow up. She can see me or APP.  She should reload with RInvoq and then done with that course would transition to 30mg  / day. She flared on lower dose Rinvoq (15mg ) so I would not use that again. Thanks

## 2023-01-30 NOTE — Telephone Encounter (Signed)
I have spoken to patient regarding Rinvoq and have scheduled her to see Dr Adela Lank on 02/06/23 in the office for follow up. Patient says that she has not missed any Rinvoq as insurance claims. She states she went around 2 weeks without it due to sickness at one point so had about 2 weeks extra, but past that, she has been taking as prescribed.

## 2023-02-06 ENCOUNTER — Telehealth: Payer: Self-pay

## 2023-02-06 ENCOUNTER — Ambulatory Visit: Payer: Commercial Managed Care - PPO | Admitting: Gastroenterology

## 2023-02-06 ENCOUNTER — Other Ambulatory Visit: Payer: Self-pay

## 2023-02-06 ENCOUNTER — Other Ambulatory Visit (HOSPITAL_COMMUNITY): Payer: Self-pay

## 2023-02-06 ENCOUNTER — Encounter: Payer: Self-pay | Admitting: Gastroenterology

## 2023-02-06 ENCOUNTER — Other Ambulatory Visit (INDEPENDENT_AMBULATORY_CARE_PROVIDER_SITE_OTHER): Payer: Commercial Managed Care - PPO

## 2023-02-06 VITALS — BP 122/86 | HR 72 | Ht 66.0 in | Wt 237.0 lb

## 2023-02-06 DIAGNOSIS — Z79899 Other long term (current) drug therapy: Secondary | ICD-10-CM | POA: Diagnosis not present

## 2023-02-06 DIAGNOSIS — K523 Indeterminate colitis: Secondary | ICD-10-CM

## 2023-02-06 LAB — COMPREHENSIVE METABOLIC PANEL
ALT: 11 U/L (ref 0–35)
AST: 13 U/L (ref 0–37)
Albumin: 4 g/dL (ref 3.5–5.2)
Alkaline Phosphatase: 76 U/L (ref 39–117)
BUN: 12 mg/dL (ref 6–23)
CO2: 29 meq/L (ref 19–32)
Calcium: 8.4 mg/dL (ref 8.4–10.5)
Chloride: 103 meq/L (ref 96–112)
Creatinine, Ser: 1.27 mg/dL — ABNORMAL HIGH (ref 0.40–1.20)
GFR: 52.54 mL/min — ABNORMAL LOW (ref >60.00)
Glucose, Bld: 86 mg/dL (ref 70–99)
Potassium: 3.7 meq/L (ref 3.5–5.1)
Sodium: 137 meq/L (ref 135–145)
Total Bilirubin: 0.4 mg/dL (ref 0.2–1.2)
Total Protein: 7.4 g/dL (ref 6.0–8.3)

## 2023-02-06 LAB — CBC WITH DIFFERENTIAL/PLATELET
Basophils Absolute: 0 10*3/uL (ref 0.0–0.1)
Basophils Relative: 0.3 % (ref 0.0–3.0)
Eosinophils Absolute: 0.1 10*3/uL (ref 0.0–0.7)
Eosinophils Relative: 1 % (ref 0.0–5.0)
HCT: 39.1 % (ref 36.0–46.0)
Hemoglobin: 12.8 g/dL (ref 12.0–15.0)
Lymphocytes Relative: 21.7 % (ref 12.0–46.0)
Lymphs Abs: 2.2 10*3/uL (ref 0.7–4.0)
MCHC: 32.8 g/dL (ref 30.0–36.0)
MCV: 81.2 fL (ref 78.0–100.0)
Monocytes Absolute: 0.6 10*3/uL (ref 0.1–1.0)
Monocytes Relative: 6.3 % (ref 3.0–12.0)
Neutro Abs: 7.2 10*3/uL (ref 1.4–7.7)
Neutrophils Relative %: 70.7 % (ref 43.0–77.0)
Platelets: 327 10*3/uL (ref 150.0–400.0)
RBC: 4.82 Mil/uL (ref 3.87–5.11)
RDW: 13 % (ref 11.5–15.5)
WBC: 10.2 10*3/uL (ref 4.0–10.5)

## 2023-02-06 MED ORDER — RINVOQ 30 MG PO TB24
1.0000 | ORAL_TABLET | Freq: Every day | ORAL | 3 refills | Status: DC
  Filled 2023-02-06: qty 30, 30d supply, fill #0

## 2023-02-06 NOTE — Patient Instructions (Addendum)
Please go to the lab in the basement of our building to have lab work done as you leave today. Hit "B" for basement when you get on the elevator.  When the doors open the lab is on your left.  We will call you with the results. Thank you.  Please resume RINVOQ 30 mg: Take once daily  You will be due for a fecal calprotectin next month. Please go to the lab in mid January.  Our lab is located in the basement of our building, located at [address]. They are open Monday through Friday from 7:30 am to 5:00 pm. You do not need an appointment.  You will need a colonoscopy in March or April. Please call in February to schedule.  Thank you for entrusting me with your care and for choosing [facility], Dr. Ileene Patrick    If your blood pressure at your visit was 140/90 or greater, please contact your primary care physician to follow up on this. ______________________________________________________  If you are age 41 or older, your body mass index should be between 23-30. Your Body mass index is 38.25 kg/m. If this is out of the aforementioned range listed, please consider follow up with your Primary Care Provider.  If you are age 28 or younger, your body mass index should be between 19-25. Your Body mass index is 38.25 kg/m. If this is out of the aformentioned range listed, please consider follow up with your Primary Care Provider.  ________________________________________________________  The [facility] providers would like to encourage you to use [facility] to communicate with providers for non-urgent requests or questions.  Due to long hold times on the telephone, sending your provider a message by [facility] may be a faster and more efficient way to get a response.  Please allow 48 business hours for a response.  Please remember that this is for non-urgent requests.  _______________________________________________________  Due to recent changes in healthcare laws, you may see the  results of your imaging and laboratory studies on MyChart before your provider has had a chance to review them.  We understand that in some cases there may be results that are confusing or concerning to you. Not all laboratory results come back in the same time frame and the provider may be waiting for multiple results in order to interpret others.  Please give [place] 48 hours in order for your provider to thoroughly review all the results before contacting the office for clarification of your results.

## 2023-02-06 NOTE — Progress Notes (Signed)
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 Inflammation most in right and transverse colon, very mild in left colon. Had paradoxical worsening on Lialda. 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 Eventually placed on Humira and had some initial improvement but had some elevated inflammatory markers which led to testing drug levels. She had subtherapeutic Humira levels (2.4) with low level AB present (26). Inflammatory markers were positive. She was started on methotrexate 15mg  po q week in May 2017 along with folic acid 1mg . Humira was changed to 40mg  weekly at that time. Her antibodies to Humira went away and her Humira level went from 2.4 to 5.7. Follow up colonoscopy in 2018 showed mucosal healing. Patient self-stopped all therapy in 2018 as she was feeling well, haven't seen her for some time.  She represented in 09/2019 on no medical therapy, she had fear of needles and declined Humira.  Placed on steroids and then transition to [facility] Sept 2021. Over time she flared through [facility], fecal calprotectin with active inflammation on colonoscopy despite escalation in dosing of Entyvio. Transitioned to Rinvoq in July 2023.   SINCE LAST VISIT   I have not seen her since last August.  She has been maintained on Rinvoq 30 mg daily since have last seen her.  Recall she did well with initial loading dose and last November had a flare on 15 mg daily associated with elevated fecal calprotectin to 900s.  We increased her dose of Rinvoq to 30 mg daily and that resolved her symptoms.  She has been on that for maintenance since that time and generally doing pretty well.  She states she was in and out of the country traveling and developed an infection and needed to hold her Rinvoq for a short period of time, I was not aware of this.  She had resumed it and was working well but ran out of it about 2 to 3 weeks ago.  She states she has not  had any flares of her colitis on maintenance dosing and tolerates it well.  She has no blood in her stools, no urgency with her stools, occasional loose stools but that is usually dietary related.  She is pretty happy with the regimen.  Her shingles vaccine is up-to-date, pneumococcal vaccine up-to-date.  She has not had a flu shot yet this year.  She is due for TB screening.  She is happy with the regimen and wants a refill of the Rinvoq.  Insurance questioned if she needed to reloading dose as they were not sure how long she had been off of it.   Prior workup:  Colonoscopy 04/07/2016 - mild ileal inflammation, small cecal patch of inflammation - otherwise normal colon - all biopsies returned as normal, in remission   Colonoscopy 05/26/20 - The perianal and digital rectal examinations were normal. - The terminal ileum appeared normal. - Patchy mild inflammation characterized by small patchy areas of erosions, erythema and granularity was found in the ascending colon and in the cecum. Overall severity is mild - Internal hemorrhoids were found during retroflexion. The hemorrhoids were small. - The exam was otherwise without abnormality. No inflammation of the rest of the colon appreciated. - Biopsies were taken with a cold forceps from the right colon, left colon, transverse colon and rectum. These biopsy specimens were sent to Pathology.   1. Surgical [P], right colon - INACTIVE CHRONIC NONSPECIFIC COLITIS CHARACTERIZED BY MILD ARCHITECTURAL CHANGES. SEE NOTE -  NEGATIVE FOR GRANULOMAS OR DYSPLASIA 2. Surgical [P], transverse colon - COLONIC MUCOSA WITH NO SPECIFIC HISTOPATHOLOGIC CHANGES - NEGATIVE FOR ACUTE INFLAMMATION, FEATURES OF CHRONICITY, GRANULOMAS OR DYSPLASIA 3. Surgical [P], left colon - COLONIC MUCOSA WITHIN NORMAL LIMITS; HOWEVER, WELL-HEALED CHRONIC COLITIS CANNOT BE RULED OUT - NEGATIVE FOR GRANULOMAS OR DYSPLASIA   Entyvio levels 07/08/20 - level of 6.6, AB undetectable    Fecal calprotectin of 486 on 12/15/20     Colonoscopy 06/10/21: The perianal and digital rectal examinations were normal. - The terminal ileum appeared normal. - Patchy inflammation characterized by erythema, friability and granularity was found in the sigmoid colon, in the transverse colon, in the ascending colon and in the cecum. This was moderate in the right colon and mild in the transverse and sigmoid colon. Rectum was normal. Biopsies were taken with a cold forceps for histology. - Internal hemorrhoids were found during retroflexion. The hemorrhoids were small. - The exam was otherwise without abnormality.   1. Surgical [P], right colon - CHRONIC COLITIS - NO ACUTE INFLAMMATION, GRANULOMATA OR MALIGNANCY IDENTIFIED 2. Surgical [P], colon, transverse - CHRONIC COLITIS - NO ACUTE INFLAMMATION, GRANULOMATA OR MALIGNANCY IDENTIFIED 3. Surgical [P], left colon - CHRONIC COLITIS, FOCALLY ACTIVE - NO GRANULOMATA OR MALIGNANCY IDENTIFIED   Fecal calprotectin Sept 2023 - 55  Fecal calprotectin November 2023 - 942  Increased Rinvoq from 15mg  / day to 30mg  / day Nov 2023   Past Medical History:  Diagnosis Date   Allergy    Endometriosis    Gastritis    GERD (gastroesophageal reflux disease)    H/O seasonal allergies    IBS (irritable bowel syndrome)    Indeterminate colitis    Ulcerative colitis (HCC)      Past Surgical History:  Procedure Laterality Date   COLONOSCOPY  11/2014, 03/2016   GANGLION CYST EXCISION Left 04/15/2018   Procedure: REMOVAL VOLAR GANGLION OF LEFT WRIST;  Surgeon: Betha Loa, MD;  Location: [facility];  Service: Orthopedics;  Laterality: Left;   TUBAL LIGATION     WISDOM TOOTH EXTRACTION     Family History  Problem Relation Age of Onset   Rheum arthritis Mother    Immunodeficiency Father    Bullous pemphigoid Father    Lupus Sister    Colon cancer Other        cousins and uncles   Diabetes Other        several maternal  relatives   Hypertension Other        several paternal and maternal relatives   Irritable bowel syndrome Other        mother   Breast cancer Maternal Great-grandmother    Colon polyps Neg Hx    Esophageal cancer Neg Hx    Rectal cancer Neg Hx    Stomach cancer Neg Hx    Social History   Tobacco Use   Smoking status: Never   Smokeless tobacco: Never  Vaping Use   Vaping status: Never Used  Substance Use Topics   Alcohol use: Not Currently   Drug use: Yes    Types: Marijuana   Current Outpatient Medications  Medication Sig Dispense Refill   acetaminophen (TYLENOL) 500 MG tablet Take 2 tablets (1,000 mg total) by mouth every 6 (six) hours as needed. 30 tablet 0   cetirizine (ZYRTEC) 10 MG tablet Take 10 mg by mouth daily as needed for allergies.     Cholecalciferol (D-3-5) 5000 units capsule Take 2 capsules (10,000 Units total) by mouth  daily. (Patient taking differently: Take 5,000 Units by mouth daily.)     cyclobenzaprine (FLEXERIL) 10 MG tablet Take 1 tablet (10 mg total) by mouth 3 (three) times daily as needed for muscle spasms. 30 tablet 0   fluticasone (FLONASE) 50 MCG/ACT nasal spray Place 1 spray into both nostrils daily as needed for allergies. 48 g 2   ondansetron (ZOFRAN-ODT) 4 MG disintegrating tablet Take 1 tablet (4 mg total) by mouth every 8 (eight) hours as needed for nausea or vomiting. 20 tablet 0   polyethylene glycol (MIRALAX) 17 g packet Take 17 g by mouth daily. Titrate as needed (Patient taking differently: Take 17 g by mouth daily as needed.) 14 each 0   Upadacitinib ER (RINVOQ) 30 MG TB24 Take 1 tablet by mouth daily. 30 tablet 0   vitamin C (ASCORBIC ACID) 500 MG tablet Take 1 tablet (500 mg total) by mouth daily.     Current Facility-Administered Medications  Medication Dose Route Frequency Provider Last Rate Last Admin   levonorgestrel (MIRENA) 20 MCG/DAY IUD 1 each  1 each Intrauterine Once Chrzanowski, Jami B, NP       Allergies  Allergen Reactions    Flagyl [Metronidazole] Nausea And Vomiting    Nausea and vomiting associated with oral Flagyl pills.  She is able to tolerate intravaginal application of Flagyl gel.   Keflex [Cephalexin]     hives     Review of Systems: All systems reviewed and negative except where noted in HPI.   Lab Results  Component Value Date   WBC 8.0 08/01/2022   HGB 12.4 08/01/2022   HCT 38.4 08/01/2022   MCV 83.3 08/01/2022   PLT 296 08/01/2022    Lab Results  Component Value Date   NA 139 08/01/2022   CL 105 08/01/2022   K 4.2 08/01/2022   CO2 26 08/01/2022   BUN 11 08/01/2022   CREATININE 1.23 (H) 08/01/2022   EGFR 57 (L) 08/01/2022   CALCIUM 8.5 (L) 08/01/2022   ALBUMIN 4.0 12/26/2021   GLUCOSE 93 08/01/2022    Lab Results  Component Value Date   ALT 16 12/26/2021   AST 16 12/26/2021   ALKPHOS 67 12/26/2021   BILITOT 0.5 12/26/2021     Physical Exam: BP 122/86   Pulse 72   Ht 5\' 6"  (1.676 m)   Wt 237 lb (107.5 kg)   BMI 38.25 kg/m  Constitutional: Pleasant,well-developed, female in no acute distress. Neurological: Alert and oriented to person place and time. Psychiatric: Normal mood and affect. Behavior is normal.   ASSESSMENT: 42 y.o. female here for assessment of the following  1. Colitis, indeterminate   2. High risk medication use    As above, failed a few regimens (Humira, Entyvio) and has been on Rinvoq for the past year and a half and generally doing really well on it.  On the lower dose 15 mg daily she had a elevated fecal calprotectin 900s, we increased the dose back to 30 mg daily and no symptoms since then her flares for the past year.  As above she ran out of Rinvoq about 2 weeks ago, I think we can just resume her 30 mg dosing daily and do not need to reload her at this time at the higher dosing.  We discussed risks of the drug, she understands and wishes to continue.  We discussed importance of compliance, she tolerates the medicine well, and clinically  appears working well for her.  She is due for some  basic labs today and for her TB testing, will send QuantiFERON gold.  I would like to do a fecal calprotectin in another month or 2 after she has been on stable dosing of Rinvoq to ensure this is normalized.  I also recommend a colonoscopy in the next 3 to 4 months after she has been on stable dosing for a bit to assess for mucosal healing on the regimen.  We discussed colonoscopy and what that entails, she is agreeable to proceed.  Otherwise we discussed her healthcare maintenance and all vaccines up-to-date except for yearly flu shot.  I offered her flu shot today, she is declining today but states she will get it on her own in the upcoming weeks.   PLAN: - resume Rinvoq 30mg  / day  - lab for CBC, CMET, quantiferon gold - lab for fecal calprotectin - do in 1 month - colonoscopy March / April - will schedule in a few months - vaccines UTD but flu shot - she declines that today - f/u 6 months or sooner with issues   Harlin Rain, MD [facility]

## 2023-02-07 ENCOUNTER — Other Ambulatory Visit (HOSPITAL_COMMUNITY): Payer: Self-pay

## 2023-02-07 ENCOUNTER — Other Ambulatory Visit: Payer: Self-pay | Admitting: Pharmacist

## 2023-02-07 ENCOUNTER — Other Ambulatory Visit: Payer: Self-pay

## 2023-02-07 ENCOUNTER — Encounter: Payer: Self-pay | Admitting: Family Medicine

## 2023-02-07 MED ORDER — RINVOQ 30 MG PO TB24
1.0000 | ORAL_TABLET | Freq: Every day | ORAL | 3 refills | Status: DC
  Filled 2023-02-07: qty 30, 30d supply, fill #0
  Filled 2023-03-07: qty 30, 30d supply, fill #1
  Filled 2023-04-17 – 2023-05-15 (×3): qty 30, 30d supply, fill #2
  Filled 2023-06-01: qty 30, 30d supply, fill #3
  Filled 2023-07-02: qty 30, 30d supply, fill #4
  Filled 2023-07-31: qty 30, 30d supply, fill #5
  Filled 2023-08-27 – 2023-08-29 (×2): qty 30, 30d supply, fill #6
  Filled 2023-09-28 – 2023-11-06 (×4): qty 30, 30d supply, fill #7
  Filled 2023-12-03: qty 30, 30d supply, fill #8
  Filled 2023-12-27: qty 30, 30d supply, fill #9
  Filled 2024-01-29: qty 30, 30d supply, fill #10

## 2023-02-07 NOTE — Progress Notes (Signed)
Spoke with patient & aware of new pick up date. [facility] got [facility] Approve & $0 copay

## 2023-02-08 LAB — QUANTIFERON-TB GOLD PLUS
Mitogen-NIL: 4.02 [IU]/mL
NIL: 0.04 [IU]/mL
QuantiFERON-TB Gold Plus: NEGATIVE
TB1-NIL: 0 [IU]/mL
TB2-NIL: 0.01 [IU]/mL

## 2023-02-08 NOTE — Telephone Encounter (Signed)
ERROR

## 2023-02-12 ENCOUNTER — Other Ambulatory Visit (HOSPITAL_COMMUNITY): Payer: Self-pay

## 2023-02-26 ENCOUNTER — Other Ambulatory Visit (HOSPITAL_COMMUNITY): Payer: Self-pay

## 2023-03-01 ENCOUNTER — Other Ambulatory Visit: Payer: Self-pay

## 2023-03-05 ENCOUNTER — Telehealth: Payer: Self-pay

## 2023-03-05 ENCOUNTER — Other Ambulatory Visit: Payer: Self-pay

## 2023-03-05 DIAGNOSIS — Z79899 Other long term (current) drug therapy: Secondary | ICD-10-CM

## 2023-03-05 DIAGNOSIS — K50119 Crohn's disease of large intestine with unspecified complications: Secondary | ICD-10-CM

## 2023-03-05 NOTE — Telephone Encounter (Signed)
-----   Message from [facility] Marylu Lund H sent at 02/06/2023  2:45 PM EST ----- Regarding: due for fecal calprotectin in mid January Remind patient to go to the lab in mid January.  Put in order for fecal calprotectin

## 2023-03-07 ENCOUNTER — Other Ambulatory Visit: Payer: Self-pay

## 2023-03-07 NOTE — Progress Notes (Signed)
 Specialty Pharmacy Refill Coordination Note  Theresa Farmer is a 43 y.o. female contacted today regarding refills of specialty medication(s) Upadacitinib  (Rinvoq )   Patient requested [facility] at [facility] at [place] date: 03/16/23   Medication will be filled on 01.23.25.

## 2023-03-08 NOTE — Telephone Encounter (Signed)
Called and spoke to patient.  She will go to the lab one day this week or next for stool test

## 2023-03-15 ENCOUNTER — Other Ambulatory Visit: Payer: Self-pay

## 2023-03-20 ENCOUNTER — Telehealth: Payer: Self-pay

## 2023-03-20 NOTE — Telephone Encounter (Signed)
-----   Message from [facility] Marylu Lund H sent at 02/06/2023  2:53 PM EST ----- Regarding: due for Colonoscopy Did patient call back to schedule Colonoscopy?  If not call patient to schedule procedure in late March or April for Colitis - on Rinvoq

## 2023-03-20 NOTE — Telephone Encounter (Signed)
Called and spoke to patient.  She has not had time to schedule her colonoscopy and could not schedule as she does not know her calendar for April. She said she is not available to have it done in March. She asked that we call her next week to get her scheduled in April.  I gave her the days that Dr. Adela Lank will be in the [facility] so she can look into which of the days might work for her. Reminded placed to call her next Wednesday

## 2023-03-26 ENCOUNTER — Other Ambulatory Visit: Payer: Self-pay

## 2023-03-26 NOTE — Progress Notes (Signed)
03/26/23-CA: Rinvoq  Patient will pick up 02/03 or 02/04.

## 2023-03-28 ENCOUNTER — Telehealth: Payer: Self-pay

## 2023-03-28 ENCOUNTER — Other Ambulatory Visit (HOSPITAL_COMMUNITY): Payer: Self-pay

## 2023-03-28 NOTE — Telephone Encounter (Signed)
-----   Message from [facility] Clarita H sent at 03/20/2023  9:40 AM EST ----- Regarding: call pt to schedule Colon with [facility] in April call pt to schedule Colon with [facility] in April. Called her last week but she couldn't schedule before she looked at her April calendar

## 2023-03-28 NOTE — Telephone Encounter (Signed)
 Called and scheduled patient for colonoscopy in the [facility] with Dr. General Kenner on 4-9 and a PV by phone on on 3-26

## 2023-04-05 ENCOUNTER — Other Ambulatory Visit: Payer: Self-pay

## 2023-04-17 ENCOUNTER — Other Ambulatory Visit: Payer: Self-pay

## 2023-04-17 NOTE — Progress Notes (Signed)
 Specialty Pharmacy Refill Coordination Note  Theresa Farmer is a 43 y.o. female contacted today regarding refills of specialty medication(s) Upadacitinib (Rinvoq)   Patient requested [name] at [facility] at [place] date: 04/19/23   Medication will be filled on 04/18/23.

## 2023-05-11 ENCOUNTER — Other Ambulatory Visit: Payer: Self-pay

## 2023-05-11 ENCOUNTER — Other Ambulatory Visit (HOSPITAL_COMMUNITY): Payer: Self-pay

## 2023-05-11 NOTE — Progress Notes (Signed)
 Patient never picked up medication and has refill call 3/25. Returned to [employer].

## 2023-05-15 ENCOUNTER — Other Ambulatory Visit (HOSPITAL_COMMUNITY): Payer: Self-pay

## 2023-05-16 ENCOUNTER — Ambulatory Visit (AMBULATORY_SURGERY_CENTER): Payer: Commercial Managed Care - PPO

## 2023-05-16 VITALS — Ht 66.0 in | Wt 230.0 lb

## 2023-05-16 DIAGNOSIS — K529 Noninfective gastroenteritis and colitis, unspecified: Secondary | ICD-10-CM

## 2023-05-16 DIAGNOSIS — K50119 Crohn's disease of large intestine with unspecified complications: Secondary | ICD-10-CM

## 2023-05-16 DIAGNOSIS — K523 Indeterminate colitis: Secondary | ICD-10-CM

## 2023-05-16 MED ORDER — SUTAB 1479-225-188 MG PO TABS: ORAL_TABLET | ORAL | 0 refills | Status: DC

## 2023-05-16 NOTE — Progress Notes (Signed)
 No egg or soy allergy known to patient  No issues known to pt with past sedation with any surgeries or procedures Patient denies ever being told they had issues or difficulty with intubation  No FH of Malignant Hyperthermia Pt is not on diet pills; pt states no GLP-1 medications Pt is not on home 02  Pt is not on blood thinners  Pt states occasional issues with constipation  No A fib or A flutter Have any cardiac testing pending--no Ambulates indepedently.

## 2023-05-21 ENCOUNTER — Ambulatory Visit (INDEPENDENT_AMBULATORY_CARE_PROVIDER_SITE_OTHER): Admitting: Family Medicine

## 2023-05-21 ENCOUNTER — Encounter: Payer: Self-pay | Admitting: Family Medicine

## 2023-05-21 VITALS — BP 124/76 | HR 72 | Ht 66.0 in | Wt 228.0 lb

## 2023-05-21 DIAGNOSIS — Z0001 Encounter for general adult medical examination with abnormal findings: Secondary | ICD-10-CM | POA: Diagnosis not present

## 2023-05-21 DIAGNOSIS — Z0000 Encounter for general adult medical examination without abnormal findings: Secondary | ICD-10-CM | POA: Diagnosis not present

## 2023-05-21 DIAGNOSIS — R7989 Other specified abnormal findings of blood chemistry: Secondary | ICD-10-CM | POA: Diagnosis not present

## 2023-05-21 DIAGNOSIS — M5442 Lumbago with sciatica, left side: Secondary | ICD-10-CM | POA: Diagnosis not present

## 2023-05-21 DIAGNOSIS — Z1231 Encounter for screening mammogram for malignant neoplasm of breast: Secondary | ICD-10-CM

## 2023-05-21 MED ORDER — CELECOXIB 200 MG PO CAPS
200.0000 mg | ORAL_CAPSULE | Freq: Two times a day (BID) | ORAL | 3 refills | Status: DC
Start: 2023-05-21 — End: 2023-08-09

## 2023-05-21 MED ORDER — GABAPENTIN 300 MG PO CAPS: 300.0000 mg | ORAL_CAPSULE | Freq: Three times a day (TID) | ORAL | 3 refills | Status: AC | PRN

## 2023-05-21 NOTE — Progress Notes (Signed)
 Subjective:    Patient ID: Theresa Farmer, female    DOB: 06-17-1980, 43 y.o.   MRN: [id number]  Back Pain  Hip Pain    Patient is a very sweet 43 year old African-American female who presents today for complete physical exam.  I originally saw the patient in June of last year for left-sided sciatica.  She states that over the last 4 to 5 weeks it has become constant.  She is dealing with constant pain in her left posterior hip.  Is radiating down her leg.  It hurts for her to sit.  In fact, during her physical exam today, the patient has to stand the entire time because of discomfort and pain radiating down her left leg.  She has been taking ibuprofen and Advil over the last month.  Unfortunately on her lab work from December, she had an elevated creatinine with a GFR in the 50s.  This may be contraindicated for her.  She is due for a mammogram.  She gets her Pap smear through her gynecologist. Past Medical History:  Diagnosis Date   Allergy    Endometriosis    Gastritis    GERD (gastroesophageal reflux disease)    H/O seasonal allergies    IBS (irritable bowel syndrome)    Indeterminate colitis    Ulcerative colitis (HCC)    Past Surgical History:  Procedure Laterality Date   COLONOSCOPY  11/2014, 03/2016   GANGLION CYST EXCISION Left 04/15/2018   Procedure: REMOVAL VOLAR GANGLION OF LEFT WRIST;  Surgeon: Betha Loa, MD;  Location: [facility];  Service: Orthopedics;  Laterality: Left;   TUBAL LIGATION     WISDOM TOOTH EXTRACTION     Current Outpatient Medications on File Prior to Visit  Medication Sig Dispense Refill   acetaminophen (TYLENOL) 500 MG tablet Take 2 tablets (1,000 mg total) by mouth every 6 (six) hours as needed. 30 tablet 0   cetirizine (ZYRTEC) 10 MG tablet Take 10 mg by mouth daily as needed for allergies.     Cholecalciferol (D-3-5) 5000 units capsule Take 2 capsules (10,000 Units total) by mouth daily. (Patient taking differently: Take 5,000 Units  by mouth daily.)     cyclobenzaprine (FLEXERIL) 10 MG tablet Take 1 tablet (10 mg total) by mouth 3 (three) times daily as needed for muscle spasms. 30 tablet 0   fluticasone (FLONASE) 50 MCG/ACT nasal spray Place 1 spray into both nostrils daily as needed for allergies. 48 g 2   polyethylene glycol (MIRALAX) 17 g packet Take 17 g by mouth daily. Titrate as needed (Patient taking differently: Take 17 g by mouth daily as needed.) 14 each 0   Sodium Sulfate-Mag Sulfate-KCl (SUTAB) [phone] MG TABS Use as directed for colonoscopy. MANUFACTURER CODES!! BIN: [id number] PCN: CN GROUP: [id number] MEMBER ID: [id number];[employer] AS SECONDARY INSURANCE ;NO PRIOR AUTHORIZATION 24 tablet 0   Upadacitinib ER (RINVOQ) 30 MG TB24 Take 1 tablet by mouth daily. 90 tablet 3   vitamin C (ASCORBIC ACID) 500 MG tablet Take 1 tablet (500 mg total) by mouth daily.     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  Tobacco Use   Smoking status: Never   Smokeless tobacco: Never  Vaping Use   Vaping status: Never Used  Substance and Sexual Activity   Alcohol use: Not Currently   Drug use: Yes    Frequency: 2.0 times per week    Types: Marijuana   Sexual activity: Yes    Partners: Male    Birth control/protection: Surgical    Comment: tubal  Other Topics Concern   Not on file  Social History Narrative   Not on file   Social Drivers of Health   Financial Resource Strain: Not on file  Food Insecurity: Not on file  Transportation Needs: Not on file   Physical Activity: Not on file  Stress: Not on file  Social Connections: Not on file  Intimate Partner Violence: Not on file      Review of Systems  Musculoskeletal:  Positive for back pain.  All other systems reviewed and are negative.      Objective:   Physical Exam Vitals reviewed.  Constitutional:      General: She is not in acute distress.    Appearance: Normal appearance. She is obese. She is not ill-appearing, toxic-appearing or diaphoretic.  HENT:     Right Ear: Tympanic membrane and ear canal normal.     Left Ear: Tympanic membrane and ear canal normal.     Nose: Nose normal. No congestion or rhinorrhea.     Mouth/Throat:     Mouth: Mucous membranes are moist.     Pharynx: Oropharynx is clear. No oropharyngeal exudate or posterior oropharyngeal erythema.  Eyes:     General:        Right eye: No discharge.        Left eye: No discharge.     Conjunctiva/sclera: Conjunctivae normal.     Pupils: Pupils are equal, round, and reactive to light.  Neck:     Vascular: No carotid bruit.  Cardiovascular:     Rate and Rhythm: Normal rate and regular rhythm.     Heart sounds: Normal heart sounds. No murmur heard.    No friction rub. No gallop.  Pulmonary:     Effort: Pulmonary effort is normal. No respiratory distress.     Breath sounds: Normal breath sounds. No wheezing, rhonchi or rales.  Abdominal:     General: Bowel sounds are normal. There is no distension.     Palpations: Abdomen is soft.     Tenderness: There is no abdominal tenderness. There is no guarding or rebound.  Musculoskeletal:     Cervical back: Normal range of motion. No rigidity.     Lumbar back: Spasms present. No tenderness or bony tenderness. Decreased range of motion. Negative right straight leg raise test and negative left straight leg raise test.     Right lower leg: No edema.     Left lower leg: No edema.  Lymphadenopathy:     Cervical: No cervical adenopathy.  Skin:    Findings: No  erythema or rash.  Neurological:     General: No focal deficit present.     Mental Status: She is alert and oriented to person, place, and time. Mental status is at baseline.     Cranial Nerves: No cranial nerve deficit.     Motor: No weakness.     Coordination: Coordination normal.     Gait:  Gait normal.  Psychiatric:        Mood and Affect: Mood normal.        Behavior: Behavior normal.        Thought Content: Thought content normal.        Judgment: Judgment normal.           Assessment & Plan:  Acute left-sided low back pain with left-sided sciatica - Plan: Ambulatory referral to Physical Therapy  Encounter for screening mammogram for malignant neoplasm of breast - Plan: MM 3D SCREENING MAMMOGRAM BILATERAL BREAST  Elevated serum creatinine - Plan: CBC with Differential/Platelet, COMPLETE METABOLIC PANEL WITHOUT GFR, Lipid panel, Protein / Creatinine Ratio, Urine  General medical exam - Plan: CBC with Differential/Platelet, COMPLETE METABOLIC PANEL WITHOUT GFR, Lipid panel, Protein / Creatinine Ratio, Urine Patient is in significant discomfort.  First I will refer her to physical therapy.  Second we will recheck her renal function.  Assuming her renal function is better, she can use Celebrex 200 mg twice daily for back pain.  She can also use gabapentin 300 mg 3 times a day as needed for nerve pain.  If her renal function has deteriorated further, she should not use NSAIDs.  Instead she can use Tylenol or muscle relaxers.  If the back pain does not improve we will need to proceed with an MRI and possible corticosteroid injection.  The patient for mammogram.  With regards to her renal function I will also check a protein creatinine ratio.  If the renal function is worse I will plan to get a renal ultrasound.

## 2023-05-22 LAB — CBC WITH DIFFERENTIAL/PLATELET
Absolute Lymphocytes: 3492 {cells}/uL (ref 850–3900)
Absolute Monocytes: 459 {cells}/uL (ref 200–950)
Basophils Absolute: 27 {cells}/uL (ref 0–200)
Basophils Relative: 0.3 %
Eosinophils Absolute: 63 {cells}/uL (ref 15–500)
Eosinophils Relative: 0.7 %
HCT: 40.1 % (ref 35.0–45.0)
Hemoglobin: 12.9 g/dL (ref 11.7–15.5)
MCH: 26.3 pg — ABNORMAL LOW (ref 27.0–33.0)
MCHC: 32.2 g/dL (ref 32.0–36.0)
MCV: 81.8 fL (ref 80.0–100.0)
MPV: 10.2 fL (ref 7.5–12.5)
Monocytes Relative: 5.1 %
Neutro Abs: 4959 {cells}/uL (ref 1500–7800)
Neutrophils Relative %: 55.1 %
Platelets: 332 10*3/uL (ref 140–400)
RBC: 4.9 10*6/uL (ref 3.80–5.10)
RDW: 12.3 % (ref 11.0–15.0)
Total Lymphocyte: 38.8 %
WBC: 9 10*3/uL (ref 3.8–10.8)

## 2023-05-22 LAB — COMPLETE METABOLIC PANEL WITHOUT GFR
AG Ratio: 1.2 (calc) (ref 1.0–2.5)
ALT: 11 U/L (ref 6–29)
AST: 15 U/L (ref 10–30)
Albumin: 4.1 g/dL (ref 3.6–5.1)
Alkaline phosphatase (APISO): 72 U/L (ref 31–125)
BUN/Creatinine Ratio: 15 (calc) (ref 6–22)
BUN: 16 mg/dL (ref 7–25)
CO2: 25 mmol/L (ref 20–32)
Calcium: 9.3 mg/dL (ref 8.6–10.2)
Chloride: 105 mmol/L (ref 98–110)
Creat: 1.1 mg/dL — ABNORMAL HIGH (ref 0.50–0.99)
Globulin: 3.4 g/dL (ref 1.9–3.7)
Glucose, Bld: 84 mg/dL (ref 65–99)
Potassium: 4.7 mmol/L (ref 3.5–5.3)
Sodium: 138 mmol/L (ref 135–146)
Total Bilirubin: 0.4 mg/dL (ref 0.2–1.2)
Total Protein: 7.5 g/dL (ref 6.1–8.1)

## 2023-05-22 LAB — LIPID PANEL
Cholesterol: 169 mg/dL (ref <200)
HDL: 41 mg/dL — ABNORMAL LOW (ref >=50)
LDL Cholesterol (Calc): 114 mg/dL — ABNORMAL HIGH
Non-HDL Cholesterol (Calc): 128 mg/dL (ref <130)
Total CHOL/HDL Ratio: 4.1 (calc) (ref <5.0)
Triglycerides: 62 mg/dL (ref <150)

## 2023-05-22 LAB — PROTEIN / CREATININE RATIO, URINE
Creatinine, Urine: 123 mg/dL (ref 20–275)
Protein/Creat Ratio: 73 mg/g{creat} (ref 24–184)
Protein/Creatinine Ratio: 0.073 mg/mg{creat} (ref 0.024–0.184)
Total Protein, Urine: 9 mg/dL (ref 5–24)

## 2023-05-24 ENCOUNTER — Other Ambulatory Visit: Payer: Self-pay

## 2023-05-24 MED ORDER — FLUTICASONE PROPIONATE 50 MCG/ACT NA SUSP: 1.0000 | Freq: Every day | NASAL | 2 refills | Status: AC | PRN

## 2023-05-27 ENCOUNTER — Encounter: Payer: Self-pay | Admitting: Certified Registered Nurse Anesthetist

## 2023-05-29 ENCOUNTER — Encounter: Payer: Self-pay | Admitting: Gastroenterology

## 2023-05-29 ENCOUNTER — Other Ambulatory Visit: Payer: Commercial Managed Care - PPO

## 2023-05-30 ENCOUNTER — Encounter: Payer: Self-pay | Admitting: Gastroenterology

## 2023-05-30 ENCOUNTER — Ambulatory Visit: Payer: Commercial Managed Care - PPO | Admitting: Gastroenterology

## 2023-05-30 VITALS — BP 131/88 | HR 51 | Temp 97.9°F | Resp 11 | Ht 66.0 in | Wt 230.0 lb

## 2023-05-30 DIAGNOSIS — K529 Noninfective gastroenteritis and colitis, unspecified: Secondary | ICD-10-CM | POA: Diagnosis not present

## 2023-05-30 DIAGNOSIS — K523 Indeterminate colitis: Secondary | ICD-10-CM | POA: Diagnosis not present

## 2023-05-30 DIAGNOSIS — K648 Other hemorrhoids: Secondary | ICD-10-CM | POA: Diagnosis not present

## 2023-05-30 DIAGNOSIS — K6389 Other specified diseases of intestine: Secondary | ICD-10-CM

## 2023-05-30 MED ORDER — SODIUM CHLORIDE 0.9 % IV SOLN: 500.0000 mL | INTRAVENOUS | Status: DC

## 2023-05-30 NOTE — Patient Instructions (Signed)

## 2023-05-30 NOTE — Progress Notes (Signed)
 Pt's states no medical or surgical changes since previsit or office visit.

## 2023-05-30 NOTE — Progress Notes (Signed)
 [facility] History and Physical   Primary Care Physician:  Donita Brooks, MD   Reason for Procedure:   Indeterminate colitis - surveillance  Plan:    colonoscopy     HPI: Theresa Farmer is a 43 y.o. female  here for colonoscopy  surveillance - last exam 2023, now on Rinvoq and clinically doing better. Here for surveillance exam for her colitis.   Patient denies any bowel symptoms at this time. No family history of colon cancer known. Otherwise feels well without any cardiopulmonary symptoms.   I have discussed risks / benefits of anesthesia and endoscopic procedure with Theresa Farmer and they wish to proceed with the exams as outlined today.    Past Medical History:  Diagnosis Date   Allergy    Endometriosis    Gastritis    GERD (gastroesophageal reflux disease)    H/O seasonal allergies    IBS (irritable bowel syndrome)    Indeterminate colitis    Ulcerative colitis (HCC)     Past Surgical History:  Procedure Laterality Date   COLONOSCOPY  11/2014, 03/2016   GANGLION CYST EXCISION Left 04/15/2018   Procedure: REMOVAL VOLAR GANGLION OF LEFT WRIST;  Surgeon: Betha Loa, MD;  Location: [facility];  Service: Orthopedics;  Laterality: Left;   TUBAL LIGATION     WISDOM TOOTH EXTRACTION      Prior to Admission medications   Medication Sig Start Date End Date Taking? Authorizing Provider  acetaminophen (TYLENOL) 500 MG tablet Take 2 tablets (1,000 mg total) by mouth every 6 (six) hours as needed. 09/09/21  Yes Carlisle Beers, FNP  cetirizine (ZYRTEC) 10 MG tablet Take 10 mg by mouth daily as needed for allergies.   Yes [provider]  Cholecalciferol (D-3-5) 5000 units capsule Take 2 capsules (10,000 Units total) by mouth daily. Patient taking differently: Take 5,000 Units by mouth daily. 06/15/17  Yes Dickey, Velna Hatchet, MD  fluticasone [facility]) 50 MCG/ACT nasal spray Place 1 spray into both nostrils daily as needed for allergies.  05/24/23  Yes Donita Brooks, MD  vitamin C (ASCORBIC ACID) 500 MG tablet Take 1 tablet (500 mg total) by mouth daily. 06/15/17  Yes Littlefield, Velna Hatchet, MD  celecoxib (CELEBREX) 200 MG capsule Take 1 capsule (200 mg total) by mouth 2 (two) times daily. 05/21/23   Donita Brooks, MD  cyclobenzaprine (FLEXERIL) 10 MG tablet Take 1 tablet (10 mg total) by mouth 3 (three) times daily as needed for muscle spasms. 08/07/22   Donita Brooks, MD  gabapentin (NEURONTIN) 300 MG capsule Take 1 capsule (300 mg total) by mouth 3 (three) times daily as needed (nerve pain). 05/21/23   Donita Brooks, MD  polyethylene glycol (MIRALAX) 17 g packet Take 17 g by mouth daily. Titrate as needed Patient taking differently: Take 17 g by mouth daily as needed. 11/30/20   Lisle Skillman, Willaim Rayas, MD  Upadacitinib ER (RINVOQ) 30 MG TB24 Take 1 tablet by mouth daily. 02/07/23   Quentin Angst, MD    Current Outpatient Medications  Medication Sig Dispense Refill   acetaminophen (TYLENOL) 500 MG tablet Take 2 tablets (1,000 mg total) by mouth every 6 (six) hours as needed. 30 tablet 0   cetirizine (ZYRTEC) 10 MG tablet Take 10 mg by mouth daily as needed for allergies.     Cholecalciferol (D-3-5) 5000 units capsule Take 2 capsules (10,000 Units total) by mouth daily. (Patient taking differently: Take 5,000 Units by mouth daily.)  fluticasone (FLONASE) 50 MCG/ACT nasal spray Place 1 spray into both nostrils daily as needed for allergies. 48 g 2   vitamin C (ASCORBIC ACID) 500 MG tablet Take 1 tablet (500 mg total) by mouth daily.     celecoxib (CELEBREX) 200 MG capsule Take 1 capsule (200 mg total) by mouth 2 (two) times daily. 60 capsule 3   cyclobenzaprine (FLEXERIL) 10 MG tablet Take 1 tablet (10 mg total) by mouth 3 (three) times daily as needed for muscle spasms. 30 tablet 0   gabapentin (NEURONTIN) 300 MG capsule Take 1 capsule (300 mg total) by mouth 3 (three) times daily as needed (nerve pain). 30 capsule 3    polyethylene glycol (MIRALAX) 17 g packet Take 17 g by mouth daily. Titrate as needed (Patient taking differently: Take 17 g by mouth daily as needed.) 14 each 0   Upadacitinib ER (RINVOQ) 30 MG TB24 Take 1 tablet by mouth daily. 90 tablet 3   Current Facility-Administered Medications  Medication Dose Route Frequency Provider Last Rate Last Admin   0.9 %  sodium chloride infusion  500 mL Intravenous Continuous Zellie Jenning, Willaim Rayas, MD       levonorgestrel (MIRENA) 20 MCG/DAY IUD 1 each  1 each Intrauterine Once Chrzanowski, Jami B, NP        Allergies as of 05/30/2023 - Review Complete 05/30/2023  Allergen Reaction Noted   Flagyl [metronidazole] Nausea And Vomiting 07/29/2022   Keflex [cephalexin] Hives 01/26/2012    Family History  Problem Relation Age of Onset   Rheum arthritis Mother    Immunodeficiency Father    Bullous pemphigoid Father    Lupus Sister    Colon cancer Other        cousins and uncles   Diabetes Other        several maternal relatives   Hypertension Other        several paternal and maternal relatives   Irritable bowel syndrome Other        mother   Breast cancer Maternal Great-grandmother    Colon polyps Neg Hx    Esophageal cancer Neg Hx    Rectal cancer Neg Hx    Stomach cancer Neg Hx     Social History   Socioeconomic History   Marital status: Married    Spouse name: Not on file   Number of children: 4   Years of education: Not on file   Highest education level: Not on file  Occupational History   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  Tobacco Use   Smoking status: Never   Smokeless tobacco: Never  Vaping Use   Vaping status: Never Used  Substance and Sexual Activity   Alcohol use: Not Currently   Drug use: Yes    Frequency: 2.0 times per week    Types: Marijuana   Sexual activity: Yes    Partners: Male    Birth control/protection: Surgical    Comment: tubal  Other Topics Concern   Not on file  Social History  Narrative   Not on file   Social Drivers of Health   Financial Resource Strain: Not on file  Food Insecurity: Not on file  Transportation Needs: Not on file  Physical Activity: Not on file  Stress: Not on file  Social Connections: Not on file  Intimate Partner Violence: Not on file    Review of Systems: All other review of systems negative except as mentioned in the HPI.  Physical Exam: Vital signs BP 128/75  Temp 97.9 F (36.6 C)   Ht 5\' 6"  (1.676 m)   Wt 230 lb (104.3 kg)   SpO2 97%   BMI 37.12 kg/m   General:   Alert,  Well-developed, pleasant and cooperative in NAD Lungs:  Clear throughout to auscultation.   Heart:  Regular rate and rhythm Abdomen:  Soft, nontender and nondistended.   Neuro/Psych:  Alert and cooperative. Normal mood and affect. A and O x 3  Harlin Rain, MD [facility]

## 2023-05-30 NOTE — Progress Notes (Signed)
 Called to room to assist during endoscopic procedure.  Patient ID and intended procedure confirmed with present staff. Received instructions for my participation in the procedure from the performing physician.

## 2023-05-30 NOTE — Progress Notes (Signed)
 Report given to PACU, vss

## 2023-05-30 NOTE — Op Note (Signed)
 [facility] Patient Name: Theresa Farmer Procedure Date: 05/30/2023 8:52 AM MRN: [id number] Endoscopist: Viviann Spare P. 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 Has missed dosing recently due to viral                            illness. She is clinically feeling well with good                            control of colitis symptoms. Medicines:                Monitored Anesthesia Care Procedure:                Pre-Anesthesia Assessment:                           - Prior to the procedure, a History and Physical                            was performed, and patient medications and                            allergies were reviewed. The patient's tolerance of                            previous anesthesia was also reviewed. The risks                            and benefits of the procedure and the sedation                            options and risks were discussed with the patient.                            All questions were answered, and informed consent                            was obtained. Prior Anticoagulants: The patient has                            taken no anticoagulant or antiplatelet agents. ASA                            Grade Assessment: II - A patient with mild systemic  disease. After reviewing the risks and benefits,                            the patient was deemed in satisfactory condition to                            undergo the procedure.                           After obtaining informed consent, the colonoscope                            was passed under direct vision. Throughout the                            procedure, the patient's blood  pressure, pulse, and                            oxygen saturations were monitored continuously. The                            Olympus Scope [id number] was introduced through the                            anus and advanced to the the terminal ileum, with                            identification of the appendiceal orifice and IC                            valve. The colonoscopy was performed without                            difficulty. The patient tolerated the procedure                            well. The quality of the bowel preparation was                            adequate. The terminal ileum, ileocecal valve,                            appendiceal orifice, and rectum were photographed. Scope In: 9:01:45 AM Scope Out: 9:16:38 AM Scope Withdrawal Time: 0 hours 10 minutes 32 seconds  Total Procedure Duration: 0 hours 14 minutes 53 seconds  Findings:                 The perianal and digital rectal examinations were                            normal.                           The terminal ileum appeared normal.  Patchy mild inflammation characterized by erosions,                            erythema, friability and granularity was found at                            the hepatic flexure, in the ascending colon and in                            the cecum. Biopsies were taken with a cold forceps                            for histology.                           Internal hemorrhoids were found during                            retroflexion. The hemorrhoids were small.                           The exam was otherwise without abnormality.                           Biopsies were taken with a cold forceps for                            histology of the transverse and left colon for                            surveillance as well. Complications:            No immediate complications. Estimated blood loss:                            Minimal. Estimated Blood Loss:      Estimated blood loss was minimal. Impression:               - The examined portion of the ileum was normal.                           - Patchy mild inflammation was found at the hepatic                            flexure, in the ascending colon and in the cecum                            secondary to colitis. Biopsied.                           - Internal hemorrhoids.                           - The examination was otherwise normal.                           -  Biopsies were taken with a cold forceps for                            histology.                           Overall, mildly active inflammation in the right                            colon. Unclear if this is related to missing dosing                            intermittently (she has not taken it with recent                            infectious illnesses) or failure of Rinvoq. Recommendation:           - Patient has a contact number available for                            emergencies. The signs and symptoms of potential                            delayed complications were discussed with the                            patient. Return to normal activities tomorrow.                            Written discharge instructions were provided to the                            patient.                           - Resume previous diet.                           - Continue present medications.                           - Take Rinvoq daily unless active infection /                            illness                           - Await pathology results.                           - Anticipate fecal calprotectin in a few months to                            trend Viviann Spare P. Cambreigh Dearing, MD 05/30/2023 9:26:00 AM This report has been signed electronically.

## 2023-05-31 ENCOUNTER — Telehealth: Payer: Self-pay

## 2023-05-31 NOTE — Telephone Encounter (Signed)
  Follow up Call-     05/30/2023    8:26 AM 06/10/2021   12:59 PM  Call back number  Post procedure Call Back phone  # [phone]  Permission to leave phone message Yes Yes     Patient questions:  Do you have a fever, pain , or abdominal swelling? No. Pain Score  0 *  Have you tolerated food without any problems? Yes.    Have you been able to return to your normal activities? Yes.    Do you have any questions about your discharge instructions: Diet   No. Medications  No. Follow up visit  No.  Do you have questions or concerns about your Care? No.  Actions: * If pain score is 4 or above: No action needed, pain <4.

## 2023-06-01 ENCOUNTER — Other Ambulatory Visit: Payer: Self-pay | Admitting: Pharmacy Technician

## 2023-06-01 ENCOUNTER — Other Ambulatory Visit: Payer: Self-pay

## 2023-06-01 ENCOUNTER — Ambulatory Visit
Admission: RE | Admit: 2023-06-01 | Discharge: 2023-06-01 | Disposition: A | Discharge status: Home / Self Care | Source: Ambulatory Visit | Attending: Family Medicine | Admitting: Family Medicine

## 2023-06-01 ENCOUNTER — Other Ambulatory Visit: Payer: Self-pay | Admitting: Family Medicine

## 2023-06-01 ENCOUNTER — Encounter: Payer: Self-pay | Admitting: Gastroenterology

## 2023-06-01 DIAGNOSIS — N6310 Unspecified lump in the right breast, unspecified quadrant: Secondary | ICD-10-CM

## 2023-06-01 DIAGNOSIS — N6313 Unspecified lump in the right breast, lower outer quadrant: Secondary | ICD-10-CM | POA: Diagnosis not present

## 2023-06-01 DIAGNOSIS — K50119 Crohn's disease of large intestine with unspecified complications: Secondary | ICD-10-CM

## 2023-06-01 DIAGNOSIS — K529 Noninfective gastroenteritis and colitis, unspecified: Secondary | ICD-10-CM

## 2023-06-01 LAB — SURGICAL PATHOLOGY

## 2023-06-01 NOTE — Progress Notes (Signed)
 Specialty Pharmacy Refill Coordination Note  Theresa Farmer is a 43 y.o. female contacted today regarding refills of specialty medication(s) Upadacitinib (Rinvoq)   Patient requested (Patient-Rptd) Delivery   Delivery date: (Patient-Rptd) 06/08/23   Verified address: (Patient-Rptd) [address] [place] [address]   Medication will be filled on 06/07/23.

## 2023-06-07 ENCOUNTER — Other Ambulatory Visit: Payer: Self-pay

## 2023-06-15 ENCOUNTER — Ambulatory Visit (HOSPITAL_COMMUNITY): Attending: Family Medicine

## 2023-06-15 ENCOUNTER — Other Ambulatory Visit: Payer: Self-pay

## 2023-06-15 ENCOUNTER — Encounter (HOSPITAL_COMMUNITY): Payer: Self-pay

## 2023-06-15 DIAGNOSIS — R29898 Other symptoms and signs involving the musculoskeletal system: Secondary | ICD-10-CM | POA: Insufficient documentation

## 2023-06-15 DIAGNOSIS — M5442 Lumbago with sciatica, left side: Secondary | ICD-10-CM | POA: Insufficient documentation

## 2023-06-15 NOTE — Therapy (Signed)
 OUTPATIENT PHYSICAL THERAPY THORACOLUMBAR EVALUATION   Patient Name: Theresa Farmer MRN: [id number] DOB:29-Nov-1980, 43 y.o., female Today's Date: 06/19/2023  END OF SESSION:    Past Medical History:  Diagnosis Date   Allergy    Endometriosis    Gastritis    GERD (gastroesophageal reflux disease)    H/O seasonal allergies    IBS (irritable bowel syndrome)    Indeterminate colitis    Ulcerative colitis (HCC)    Past Surgical History:  Procedure Laterality Date   COLONOSCOPY  11/2014, 03/2016   GANGLION CYST EXCISION Left 04/15/2018   Procedure: REMOVAL VOLAR GANGLION OF LEFT WRIST;  Surgeon: Brunilda Capra, MD;  Location: [facility];  Service: Orthopedics;  Laterality: Left;   TUBAL LIGATION     WISDOM TOOTH EXTRACTION     Patient Active Problem List   Diagnosis Date Noted   Viral URI with cough 05/08/2022   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   Ulcerative colitis (HCC) 10/18/2020   Secondary hypertension - Steroid induced  01/09/2020   De Quervain's tenosynovitis 11/21/2017   Ganglion of left wrist 11/21/2017   IBD (inflammatory bowel disease) 12/15/2014   Situational anxiety 12/15/2014   Class 2 obesity 01/11/2013   Constipation 01/11/2013    PCP: Austine Lefort, MD  REFERRING PROVIDER: Austine Lefort, MD  REFERRING DIAG: [phone] (ICD-10-CM) - Acute left-sided low back pain with left-sided sciatica  Rationale for Evaluation and Treatment: Rehabilitation  THERAPY DIAG:  Acute left-sided low back pain with left-sided sciatica  Weakness of left lower extremity  ONSET DATE: December of 2024  SUBJECTIVE:                                                                                                                                                                                           SUBJECTIVE STATEMENT: Pt reports back pain has been an issue for a few months now but describes sciatic pain on left side started a couple of months after the start  of the low back pain. Pt reports having to alter normal behavior for functional transfers and bed mobility because of the back pain. She says she has to stand for a few seconds and shake out the pain before beginning to walk as back an hip feel locked up.   PERTINENT HISTORY:  Chrons disease  PAIN:  Are you having pain? Yes: NPRS scale: 7/10 Pain location: left low back, and L4 dermatome all the way to the foot Pain description: locked up,  Aggravating factors: hard seats, laying down Relieving factors: moving around, standing, specific chairs  PRECAUTIONS: None  RED FLAGS: None   WEIGHT BEARING RESTRICTIONS:  No  FALLS:  Has patient fallen in last 6 months? 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  DIAGNOSTIC FINDINGS:  CLINICAL DATA:  Pain   EXAM: LUMBAR SPINE - COMPLETE 4+ VIEW   COMPARISON:  None Available.   FINDINGS: Trace retrolisthesis of L3 versus L4. No degenerative disc disease identified. Mild lower lumbar facet degenerative changes not excluded. No other bony or soft tissue abnormalities are noted. An IUD is identified in the pelvis.   IMPRESSION: 1. Trace retrolisthesis of L3 versus L4. 2. Mild lower lumbar facet degenerative changes not excluded.    PATIENT SURVEYS:  Modified Oswestry 18/50   COGNITION: Overall cognitive status: Within functional limits for tasks assessed     SENSATION: Numbness and tingling when she massages leg   PALPATION: Pt tender to palpation to lumbar spine and left gluteal region.  LUMBAR ROM:   AROM eval  Flexion 65, pain  Extension 6  Right lateral flexion 20  Left lateral flexion 15  Right rotation 100%  Left rotation 100%    (Blank rows = not tested)  LOWER EXTREMITY ROM:     Active  Right eval Left eval  Hip flexion    Hip extension    Hip abduction    Hip adduction    Hip internal rotation    Hip external rotation    Knee flexion    Knee extension    Ankle dorsiflexion    Ankle plantarflexion    Ankle inversion    Ankle eversion     (Blank rows = not tested)  LOWER EXTREMITY MMT:    MMT Right eval Left eval  Hip flexion 4- 4-  Hip extension 3+ 3+  Hip abduction 3+ 3  Hip adduction 3 3  Hip internal rotation    Hip external rotation    Knee flexion    Knee extension    Ankle dorsiflexion 5 5  Ankle plantarflexion    Ankle inversion    Ankle eversion     (Blank rows = not tested)  LUMBAR SPECIAL TESTS:  Slump test: Positive, left   FUNCTIONAL TESTS:  5 times sit to stand: 24.52  GAIT: Distance walked: 50 feet Assistive device utilized: None Level of assistance: Complete Independence Comments: Pt demonstrates antalgic gait pattern especially after just getting up from a chair or from lying down.   TREATMENT DATE:  06/15/2023  Evaluation: -ROM measured, Strength assessed, HEP prescribed, pt educated on prognosis, findings, and importance of HEP compliance if given.                                                                                      PATIENT EDUCATION:  Education details: Pt was educated on findings of PT evaluation, prognosis, frequency of therapy visits and rationale, attendance policy, and HEP if given.   Person educated: Patient Education method: Explanation, Verbal cues, and Handouts Education  comprehension: verbalized understanding and needs further education  HOME EXERCISE PROGRAM: Access Code: [email or web address] URL: [email or web address] Date: 06/15/2023 Prepared by: Armond Bertin  Exercises - Seated Slump Nerve Glide  - 1 x daily - 7 x weekly - 3 sets - 10 reps - Supine Posterior Pelvic Tilt  - 1 x daily - 7 x weekly - 3 sets - 10 reps - 3  second hold - Supine Bridge  - 1 x daily - 7 x weekly - 3 sets - 10 reps  ASSESSMENT:  CLINICAL IMPRESSION: Patient is a 43 y.o. female who was seen today for physical therapy evaluation and treatment for M54.42 (ICD-10-CM) - Acute left-sided low back pain with left-sided sciatica.   Patient demonstrates low back pain and left hip pain. Pt also demonstrates decreased bilateral LE strength, and impaired balance. Patient also demonstrates difficulty with ambulation during today's session with decreased stride length and velocity noted at the initiation of gait. Patient also demonstrates decreased performance with bed mobility and functional transfers due to pain. Patient requires increased time for transfers and multiple standing breaks due to pain. Patient would benefit from skilled physical therapy for decreased low back and left hip pain, increased endurance with ambulation, increased LE strength, and improved gait quality, for return to higher level of function with ADLs, and progress towards therapy goals.   OBJECTIVE IMPAIRMENTS: Abnormal gait, decreased endurance, decreased mobility, difficulty walking, decreased ROM, decreased strength, and pain.   ACTIVITY LIMITATIONS: carrying, lifting, bending, sitting, stairs, bed mobility, and locomotion level  PARTICIPATION LIMITATIONS: driving, shopping, community activity, occupation, and yard work  PERSONAL FACTORS: Past/current experiences are also affecting patient's functional outcome.   REHAB POTENTIAL: Good  CLINICAL DECISION MAKING: Stable/uncomplicated  EVALUATION COMPLEXITY: Low   GOALS: Goals reviewed with patient? No  SHORT TERM GOALS: Target date: 07/06/23  Pt will be independent with HEP in order to demonstrate participation in Physical Therapy POC.  Baseline: Goal status: INITIAL  2.  Pt will report 5/10 pain with mobility in order to demonstrate improved pain with ADLs.  Baseline:  Goal status: INITIAL  LONG TERM  GOALS: Target date: 07/27/23  Pt will improve 5TSTS by at least 2.3 seconds in order to demonstrate improved functional strength to return to desired activities.  Baseline: see objective.  Goal status: INITIAL  2.  Pt will improve with transfer from lying down to initiation of gait with no standing break in order to demonstrate improved functional ambulatory capacity in community setting.  Baseline: pt needs about 15 seconds to shake the pain out of left hip and to unlock hip.  Goal status: INITIAL  3.  Pt will improve Modified Oswestry score by 6 in order to demonstrate improved pain with functional goals and outcomes. Baseline: see objective.  Goal status: INITIAL  4.  Pt will report 3/10 pain with mobility in order to demonstrate reduced pain with ADLs lasting greater than 30 minutes.  Baseline: see objective.  Goal status: INITIAL   PLAN:  PT FREQUENCY: 2x/week  PT DURATION: 6 weeks  PLANNED INTERVENTIONS: 97110-Therapeutic exercises, 97530- Therapeutic activity, [id number]- Neuromuscular re-education, 97535- Self Care, [id number]- Manual therapy, [phone]- Gait training, Patient/Family education, Balance training, Spinal mobilization, Cryotherapy, and Moist heat.  PLAN FOR NEXT SESSION: Consider , re education of slump glides, manual therapy for low back and left hip   Armond Bertin, PT, DPT [facility] Office: [phone] 7:31 AM, 06/19/23

## 2023-06-19 ENCOUNTER — Ambulatory Visit (HOSPITAL_COMMUNITY)

## 2023-06-19 DIAGNOSIS — R29898 Other symptoms and signs involving the musculoskeletal system: Secondary | ICD-10-CM

## 2023-06-19 DIAGNOSIS — M5442 Lumbago with sciatica, left side: Secondary | ICD-10-CM

## 2023-06-19 NOTE — Therapy (Signed)
 OUTPATIENT PHYSICAL THERAPY THORACOLUMBAR TREATMENT   Patient Name: Theresa Farmer MRN: [id number] DOB:30-Oct-1980, 43 y.o., female Today's Date: 06/19/2023  END OF SESSION:  PT End of Session - 06/19/23 0720     Visit Number 2    Number of Visits 12    Date for PT Re-Evaluation 07/27/23    Authorization Type [facility] [employer] PPO    Authorization Time Period no auth required    Progress Note Due on Visit 10    PT Start Time 0716    PT Stop Time 0754    PT Time Calculation (min) 38 min    Activity Tolerance Patient tolerated treatment well;Patient limited by pain    Behavior During Therapy [facility] for tasks assessed/performed              Past Medical History:  Diagnosis Date   Allergy    Endometriosis    Gastritis    GERD (gastroesophageal reflux disease)    H/O seasonal allergies    IBS (irritable bowel syndrome)    Indeterminate colitis    Ulcerative colitis (HCC)    Past Surgical History:  Procedure Laterality Date   COLONOSCOPY  11/2014, 03/2016   GANGLION CYST EXCISION Left 04/15/2018   Procedure: REMOVAL VOLAR GANGLION OF LEFT WRIST;  Surgeon: Brunilda Capra, MD;  Location: [facility];  Service: Orthopedics;  Laterality: Left;   TUBAL LIGATION     WISDOM TOOTH EXTRACTION     Patient Active Problem List   Diagnosis Date Noted   Viral URI with cough 05/08/2022   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   Ulcerative colitis (HCC) 10/18/2020   Secondary hypertension - Steroid induced  01/09/2020   De Quervain's tenosynovitis 11/21/2017   Ganglion of left wrist 11/21/2017   IBD (inflammatory bowel disease) 12/15/2014   Situational anxiety 12/15/2014   Class 2 obesity 01/11/2013   Constipation 01/11/2013    PCP: Austine Lefort, MD  REFERRING PROVIDER: Austine Lefort, MD  REFERRING DIAG: [phone] (ICD-10-CM) - Acute left-sided low back pain with left-sided sciatica  Rationale for Evaluation and Treatment: Rehabilitation  THERAPY DIAG:  Acute  left-sided low back pain with left-sided sciatica  Weakness of left lower extremity  ONSET DATE: December of 2024  SUBJECTIVE:                                                                                                                                                                                           SUBJECTIVE STATEMENT: 06/19/23 Patient reports 8/10 pain on arrival; hurts most sitting to drive.  Moving is better; still has to take Tylenol  for pain. No change in pain  yet with HEP   EVAL: Pt reports back pain has been an issue for a few months now but describes sciatic pain on left side started a couple of months after the start of the low back pain. Pt reports having to alter normal behavior for functional transfers and bed mobility because of the back pain. She says she has to stand for a few seconds and shake out the pain before beginning to walk as back an hip feel locked up.   PERTINENT HISTORY:  Chrons disease  PAIN:  Are you having pain? Yes: NPRS scale: 7/10 Pain location: left low back, and L4 dermatome all the way to the foot Pain description: locked up,  Aggravating factors: hard seats, laying down Relieving factors: moving around, standing, specific chairs  PRECAUTIONS: None  RED FLAGS: None   WEIGHT BEARING RESTRICTIONS: No  FALLS:  Has patient fallen in last 6 months? 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  DIAGNOSTIC FINDINGS:  CLINICAL DATA:  Pain   EXAM: LUMBAR SPINE - COMPLETE 4+ VIEW   COMPARISON:  None Available.   FINDINGS: Trace retrolisthesis of L3 versus L4. No degenerative disc disease identified. Mild lower lumbar facet degenerative changes  not excluded. No other bony or soft tissue abnormalities are noted. An IUD is identified in the pelvis.   IMPRESSION: 1. Trace retrolisthesis of L3 versus L4. 2. Mild lower lumbar facet degenerative changes not excluded.    PATIENT SURVEYS:  Modified Oswestry 18/50   COGNITION: Overall cognitive status: Within functional limits for tasks assessed     SENSATION: Numbness and tingling when she massages leg   PALPATION: Pt tender to palpation to lumbar spine and left gluteal region.  LUMBAR ROM:   AROM eval  Flexion 65, pain  Extension 6  Right lateral flexion 20  Left lateral flexion 15  Right rotation 100%  Left rotation 100%   (Blank rows = not tested)  LOWER EXTREMITY ROM:     Active  Right eval Left eval  Hip flexion    Hip extension    Hip abduction    Hip adduction    Hip internal rotation    Hip external rotation    Knee flexion    Knee extension    Ankle dorsiflexion    Ankle plantarflexion    Ankle inversion    Ankle eversion     (Blank rows = not tested)  LOWER EXTREMITY MMT:    MMT Right eval Left eval  Hip flexion 4- 4-  Hip extension 3+ 3+  Hip abduction 3+ 3  Hip adduction 3 3  Hip internal rotation    Hip external rotation    Knee flexion    Knee extension    Ankle dorsiflexion 5 5  Ankle plantarflexion    Ankle inversion    Ankle eversion     (Blank rows = not tested)  LUMBAR SPECIAL TESTS:  Slump test: Positive, left   FUNCTIONAL TESTS:  5 times sit to stand: 24.52  GAIT: Distance walked: 50 feet Assistive device utilized: None Level of assistance: Complete Independence Comments: Pt demonstrates antalgic gait pattern especially after just getting up from a chair or from lying down.   TREATMENT DATE:  06/19/23 Prone lying-increased left leg radicular symptoms down to foot N/T Prone lying with pillow under hip centralizes pain to left hip and decreases to 6/10 x 5 min with moist heat Prone glute sets 5" hold x 10 does  not increase pain Manual grad 2 and 3 mobs to Lumbar spine 2 sets of 10 reps decreases pain to 2/10 Trial of prone lying without pillow slight increase in pain so returned to using pillow under hips Educated on sitting with lumbar roll and spine anatomy  06/15/2023  Evaluation: -ROM measured, Strength assessed, HEP prescribed, pt educated on prognosis, findings, and importance of HEP compliance if given.                                                                                      PATIENT EDUCATION:  Education details: Pt was educated on findings of PT evaluation, prognosis, frequency of therapy visits and rationale, attendance policy, and HEP if given.   Person educated: Patient Education method: Explanation, Verbal cues, and Handouts Education comprehension: verbalized understanding and needs further education  HOME EXERCISE PROGRAM: Access Code: [email or web address] URL: [email or web address] Date: 06/15/2023 Prepared by: Armond Bertin  Exercises - Seated Slump Nerve Glide  - 1 x daily - 7 x weekly - 3 sets - 10 reps - Supine Posterior Pelvic Tilt  - 1 x daily - 7 x weekly - 3 sets - 10 reps - 3 second hold - Supine Bridge  - 1 x daily - 7 x weekly - 3 sets - 10 reps  ASSESSMENT:  CLINICAL IMPRESSION: Patient with high pain levels on arrival so trial of prone lying; increased radicular symptoms; put pillow under hips and this decreased pain and centralized to left glute.  Continued with therapy in this position and added mobilizations with continued decrease in pain.  Educated patient on spine anatomy, sitting with a towel roll to maintain good posture and the importance of good posturing and lifting mechanics; centralization of pain symptoms.  Patient will benefit from continued skilled therapy services to address deficits and promote return to optimal function.      Eval:Patient is a 43 y.o. female who was seen today for physical therapy evaluation and treatment for  M54.42 (ICD-10-CM) - Acute left-sided low back pain with left-sided sciatica.   Patient demonstrates low back pain and left hip pain. Pt also demonstrates decreased bilateral LE strength, and impaired balance. Patient also demonstrates difficulty with ambulation during today's session with decreased stride length and velocity noted at the initiation of gait. Patient also demonstrates decreased performance with bed mobility and functional transfers due to pain. Patient requires increased time for transfers and multiple standing breaks due to pain. Patient would benefit from skilled physical therapy for decreased low back and left hip pain, increased endurance with ambulation, increased LE strength, and improved gait quality, for return to higher level of function with ADLs, and progress towards therapy goals.   OBJECTIVE IMPAIRMENTS: Abnormal gait, decreased endurance, decreased mobility, difficulty walking, decreased ROM, decreased strength, and pain.   ACTIVITY LIMITATIONS: carrying, lifting, bending, sitting, stairs, bed mobility, and locomotion level  PARTICIPATION LIMITATIONS: driving, shopping, community activity, occupation, and yard work  PERSONAL FACTORS: Past/current experiences are also affecting patient's functional outcome.   REHAB POTENTIAL: Good  CLINICAL DECISION MAKING: Stable/uncomplicated  EVALUATION COMPLEXITY: Low   GOALS: Goals reviewed with patient? No  SHORT TERM GOALS: Target date: 07/06/23  Pt will be independent with HEP in order to demonstrate participation in Physical Therapy POC.  Baseline: Goal status: INITIAL  2.  Pt will report 5/10 pain with mobility in order to demonstrate improved pain with ADLs.  Baseline:  Goal status: INITIAL  LONG TERM GOALS: Target date: 07/27/23  Pt will improve 5TSTS by at least 2.3 seconds in order to demonstrate improved functional strength to return to desired activities.  Baseline: see objective.  Goal status:  INITIAL  2.  Pt will improve with transfer from lying down to initiation of gait with no standing break in order to demonstrate improved functional ambulatory capacity in community setting.  Baseline: pt needs about 15 seconds to shake the pain out of left hip and to unlock hip.  Goal status: INITIAL  3.  Pt will improve Modified Oswestry score by 6 in order to demonstrate improved pain with functional goals and outcomes. Baseline: see objective.  Goal status: INITIAL  4.  Pt will report 3/10 pain with mobility in order to demonstrate reduced pain with ADLs lasting greater than 30 minutes.  Baseline: see objective.  Goal status: INITIAL   PLAN:  PT FREQUENCY: 2x/week  PT DURATION: 6 weeks  PLANNED INTERVENTIONS: 97110-Therapeutic exercises, 97530- Therapeutic activity, [id number]- Neuromuscular re-education, 97535- Self Care, [id number]- Manual therapy, [phone]- Gait training, Patient/Family education, Balance training, Spinal mobilization, Cryotherapy, and Moist heat.  PLAN FOR NEXT SESSION:  re education of slump glides, manual therapy for low back and left hip; try to progress to prone lying without pillow under hips without radicular symptoms.     8:01 AM, 06/19/23 Laquan Ludden Small Klara Stjames MPT [facility] [place] [phone]

## 2023-06-28 ENCOUNTER — Ambulatory Visit (HOSPITAL_COMMUNITY): Attending: Family Medicine

## 2023-06-28 ENCOUNTER — Encounter (HOSPITAL_COMMUNITY): Payer: Self-pay

## 2023-06-28 DIAGNOSIS — R29898 Other symptoms and signs involving the musculoskeletal system: Secondary | ICD-10-CM | POA: Diagnosis not present

## 2023-06-28 DIAGNOSIS — M5442 Lumbago with sciatica, left side: Secondary | ICD-10-CM | POA: Insufficient documentation

## 2023-06-28 NOTE — Therapy (Signed)
 OUTPATIENT PHYSICAL THERAPY THORACOLUMBAR TREATMENT   Patient Name: Theresa Farmer MRN: [id number] DOB:09/18/1980, 43 y.o., female Today's Date: 06/28/2023  END OF SESSION:  PT End of Session - 06/28/23 0803     Visit Number 3    Number of Visits 12    Date for PT Re-Evaluation 07/27/23    Authorization Type [place] [employer] PPO    Progress Note Due on Visit 10    PT Start Time 0802    PT Stop Time 0843    PT Time Calculation (min) 41 min    Activity Tolerance Patient tolerated treatment well;Patient limited by pain    Behavior During Therapy [facility] for tasks assessed/performed              Past Medical History:  Diagnosis Date   Allergy    Endometriosis    Gastritis    GERD (gastroesophageal reflux disease)    H/O seasonal allergies    IBS (irritable bowel syndrome)    Indeterminate colitis    Ulcerative colitis (HCC)    Past Surgical History:  Procedure Laterality Date   COLONOSCOPY  11/2014, 03/2016   GANGLION CYST EXCISION Left 04/15/2018   Procedure: REMOVAL VOLAR GANGLION OF LEFT WRIST;  Surgeon: Brunilda Capra, MD;  Location: [facility];  Service: Orthopedics;  Laterality: Left;   TUBAL LIGATION     WISDOM TOOTH EXTRACTION     Patient Active Problem List   Diagnosis Date Noted   Viral URI with cough 05/08/2022   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   Ulcerative colitis (HCC) 10/18/2020   Secondary hypertension - Steroid induced  01/09/2020   De Quervain's tenosynovitis 11/21/2017   Ganglion of left wrist 11/21/2017   IBD (inflammatory bowel disease) 12/15/2014   Situational anxiety 12/15/2014   Class 2 obesity 01/11/2013   Constipation 01/11/2013    PCP: Austine Lefort, MD  REFERRING PROVIDER: Austine Lefort, MD  REFERRING DIAG: [id number] (ICD-10-CM) - Acute left-sided low back pain with left-sided sciatica  Rationale for Evaluation and Treatment: Rehabilitation  THERAPY DIAG:  Acute left-sided low back pain with left-sided  sciatica  Weakness of left lower extremity  ONSET DATE: December of 2024  SUBJECTIVE:                                                                                                                                                                                           SUBJECTIVE STATEMENT: 06/28/23:  Mornings are the worst especially following sitting depending upon the vehicle.  Current pain scale 8/10 with radicular symptoms ending on calf.  Takes Tylonel for pain.  Has been dependent on HEP daily.  EVAL: Pt reports back pain has been an issue for a few months now but describes sciatic pain on left side started a couple of months after the start of the low back pain. Pt reports having to alter normal behavior for functional transfers and bed mobility because of the back pain. She says she has to stand for a few seconds and shake out the pain before beginning to walk as back an hip feel locked up.   PERTINENT HISTORY:  Chrons disease  PAIN:  Are you having pain? Yes: NPRS scale: 8/10 Pain location: left low back, and L4 dermatome ending front of calf today Pain description: locked up,  Aggravating factors: hard seats, laying down Relieving factors: moving around, standing, specific chairs  PRECAUTIONS: None  RED FLAGS: None   WEIGHT BEARING RESTRICTIONS: No  FALLS:  Has patient fallen in last 6 months? 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  DIAGNOSTIC FINDINGS:  CLINICAL DATA:  Pain   EXAM: LUMBAR SPINE - COMPLETE 4+ VIEW   COMPARISON:  None Available.   FINDINGS: Trace retrolisthesis of L3 versus L4. No degenerative disc disease identified. Mild lower lumbar facet degenerative  changes not excluded. No other bony or soft tissue abnormalities are noted. An IUD is identified in the pelvis.   IMPRESSION: 1. Trace retrolisthesis of L3 versus L4. 2. Mild lower lumbar facet degenerative changes not excluded.    PATIENT SURVEYS:  Modified Oswestry 18/50   COGNITION: Overall cognitive status: Within functional limits for tasks assessed     SENSATION: Numbness and tingling when she massages leg   PALPATION: Pt tender to palpation to lumbar spine and left gluteal region.  LUMBAR ROM:   AROM eval  Flexion 65, pain  Extension 6  Right lateral flexion 20  Left lateral flexion 15  Right rotation 100%  Left rotation 100%   (Blank rows = not tested)  LOWER EXTREMITY ROM:     Active  Right eval Left eval  Hip flexion    Hip extension    Hip abduction    Hip adduction    Hip internal rotation    Hip external rotation    Knee flexion    Knee extension    Ankle dorsiflexion    Ankle plantarflexion    Ankle inversion    Ankle eversion     (Blank rows = not tested)  LOWER EXTREMITY MMT:    MMT Right eval Left eval  Hip flexion 4- 4-  Hip extension 3+ 3+  Hip abduction 3+ 3  Hip adduction 3 3  Hip internal rotation    Hip external rotation    Knee flexion    Knee extension    Ankle dorsiflexion 5 5  Ankle plantarflexion    Ankle inversion    Ankle eversion     (Blank rows = not tested)  LUMBAR SPECIAL TESTS:  Slump test: Positive, left   FUNCTIONAL TESTS:  5 times sit to stand: 24.52  GAIT: Distance walked: 50 feet Assistive device utilized: None Level of assistance: Complete Independence Comments: Pt demonstrates antalgic gait pattern especially after just getting up from a chair or from lying down.   TREATMENT DATE:  06/28/23 Prone lying with 1 pillow  under hips x 5 min Manual PROM: knee flexion, ER and hip abduction with reports of centralization and pain reduced Supine bridges: reports radicular symptoms on calf 90/90  piriformis stretch 2x 20"- no improvements Prone no pillow heel squeeze 10x 10" Hip extension Seated slump glide Standing:  3D hip excursion (squat, lumbar extension, side bend, rotation)  06/19/23 Prone lying-increased left leg radicular symptoms down to foot N/T Prone lying with pillow under hip centralizes pain to left hip and decreases to 6/10 x 5 min with moist heat Prone glute sets 5" hold x 10 does not increase pain Manual grad 2 and 3 mobs to Lumbar spine 2 sets of 10 reps decreases pain to 2/10 Trial of prone lying without pillow slight increase in pain so returned to using pillow under hips Educated on sitting with lumbar roll and spine anatomy  06/15/2023  Evaluation: -ROM measured, Strength assessed, HEP prescribed, pt educated on prognosis, findings, and importance of HEP compliance if given.                                                                                      PATIENT EDUCATION:  Education details: Pt was educated on findings of PT evaluation, prognosis, frequency of therapy visits and rationale, attendance policy, and HEP if given.   Person educated: Patient Education method: Explanation, Verbal cues, and Handouts Education comprehension: verbalized understanding and needs further education  HOME EXERCISE PROGRAM: Access Code: [email or web address] URL: [email or web address] Date: 06/15/2023 Prepared by: Armond Bertin  Exercises - Seated Slump Nerve Glide  - 1 x daily - 7 x weekly - 3 sets - 10 reps - Supine Posterior Pelvic Tilt  - 1 x daily - 7 x weekly - 3 sets - 10 reps - 3 second hold - Supine Bridge  - 1 x daily - 7 x weekly - 3 sets - 10 reps  06/27/23: 3D hip excursion.  ASSESSMENT:  CLINICAL IMPRESSION: 06/28/23: Continued extension bias exercises with reports of radicular symptoms centralized to Lt buttocks and pain reduced to 3/10.  Pt able to tolerate prone position with no pillows.  Positive reports with pain reduction following manual  PROM including knee flexion, ER with abduction.  Progressed to standing 3D hip excursion for gluteal strengthening and hip mobility.  Pt able to demonstrate good squat mechanics, added to HEP with printout given.  EOS pain reduced to 3/10 only in Lt buttocks.  Eval:Patient is a 43 y.o. female who was seen today for physical therapy evaluation and treatment for M54.42 (ICD-10-CM) - Acute left-sided low back pain with left-sided sciatica.   Patient demonstrates low back pain and left hip pain. Pt also demonstrates decreased bilateral LE strength, and impaired balance. Patient also demonstrates difficulty with ambulation during today's session with decreased stride length and velocity noted at the initiation of gait. Patient also demonstrates decreased performance with bed mobility and functional transfers due to pain. Patient requires increased time for transfers and multiple standing breaks due to pain. Patient would benefit from skilled physical therapy for decreased low back and left hip pain, increased endurance with ambulation, increased LE strength, and improved gait quality, for return to higher level of  function with ADLs, and progress towards therapy goals.   OBJECTIVE IMPAIRMENTS: Abnormal gait, decreased endurance, decreased mobility, difficulty walking, decreased ROM, decreased strength, and pain.   ACTIVITY LIMITATIONS: carrying, lifting, bending, sitting, stairs, bed mobility, and locomotion level  PARTICIPATION LIMITATIONS: driving, shopping, community activity, occupation, and yard work  PERSONAL FACTORS: Past/current experiences are also affecting patient's functional outcome.   REHAB POTENTIAL: Good  CLINICAL DECISION MAKING: Stable/uncomplicated  EVALUATION COMPLEXITY: Low   GOALS: Goals reviewed with patient? No  SHORT TERM GOALS: Target date: 07/06/23  Pt will be independent with HEP in order to demonstrate participation in Physical Therapy POC.  Baseline: Goal status:  INITIAL  2.  Pt will report 5/10 pain with mobility in order to demonstrate improved pain with ADLs.  Baseline:  Goal status: INITIAL  LONG TERM GOALS: Target date: 07/27/23  Pt will improve 5TSTS by at least 2.3 seconds in order to demonstrate improved functional strength to return to desired activities.  Baseline: see objective.  Goal status: INITIAL  2.  Pt will improve with transfer from lying down to initiation of gait with no standing break in order to demonstrate improved functional ambulatory capacity in community setting.  Baseline: pt needs about 15 seconds to shake the pain out of left hip and to unlock hip.  Goal status: INITIAL  3.  Pt will improve Modified Oswestry score by 6 in order to demonstrate improved pain with functional goals and outcomes. Baseline: see objective.  Goal status: INITIAL  4.  Pt will report 3/10 pain with mobility in order to demonstrate reduced pain with ADLs lasting greater than 30 minutes.  Baseline: see objective.  Goal status: INITIAL   PLAN:  PT FREQUENCY: 2x/week  PT DURATION: 6 weeks  PLANNED INTERVENTIONS: 97110-Therapeutic exercises, 97530- Therapeutic activity, [id number]- Neuromuscular re-education, 97535- Self Care, [id number]- Manual therapy, [phone]- Gait training, Patient/Family education, Balance training, Spinal mobilization, Cryotherapy, and Moist heat.  PLAN FOR NEXT SESSION:  re education of slump glides, manual therapy for low back and left hip; Continue prone and extension based exercises to reduce radicular symptoms.  Minor Amble, LPTA/CLT; CBIS [phone]  9:56 AM, 06/28/23

## 2023-07-01 ENCOUNTER — Ambulatory Visit (HOSPITAL_COMMUNITY)
Admission: EM | Admit: 2023-07-01 | Discharge: 2023-07-01 | Disposition: A | Discharge status: Home / Self Care | Attending: Internal Medicine | Admitting: Internal Medicine

## 2023-07-01 ENCOUNTER — Ambulatory Visit (INDEPENDENT_AMBULATORY_CARE_PROVIDER_SITE_OTHER)

## 2023-07-01 ENCOUNTER — Encounter (HOSPITAL_COMMUNITY): Payer: Self-pay

## 2023-07-01 DIAGNOSIS — M25552 Pain in left hip: Secondary | ICD-10-CM

## 2023-07-01 DIAGNOSIS — M5441 Lumbago with sciatica, right side: Secondary | ICD-10-CM

## 2023-07-01 DIAGNOSIS — M5442 Lumbago with sciatica, left side: Secondary | ICD-10-CM

## 2023-07-01 DIAGNOSIS — M549 Dorsalgia, unspecified: Secondary | ICD-10-CM | POA: Diagnosis not present

## 2023-07-01 MED ORDER — KETOROLAC TROMETHAMINE 30 MG/ML IJ SOLN
30.0000 mg | Freq: Once | INTRAMUSCULAR | Status: AC
  Administered 2023-07-01: 30 mg via INTRAMUSCULAR

## 2023-07-01 MED ORDER — CYCLOBENZAPRINE HCL 5 MG PO TABS: 5.0000 mg | ORAL_TABLET | Freq: Three times a day (TID) | ORAL | 0 refills | Status: AC | PRN

## 2023-07-01 MED ORDER — DEXAMETHASONE SODIUM PHOSPHATE 10 MG/ML IJ SOLN
10.0000 mg | Freq: Once | INTRAMUSCULAR | Status: AC
Start: 2023-07-01 — End: 2023-07-01
  Administered 2023-07-01: 10 mg via INTRAMUSCULAR

## 2023-07-01 MED ORDER — DEXAMETHASONE SODIUM PHOSPHATE 10 MG/ML IJ SOLN
INTRAMUSCULAR | Status: AC
  Filled 2023-07-01: qty 1

## 2023-07-01 MED ORDER — PREDNISONE 20 MG PO TABS: 40.0000 mg | ORAL_TABLET | Freq: Every day | ORAL | 0 refills | Status: AC

## 2023-07-01 MED ORDER — KETOROLAC TROMETHAMINE 30 MG/ML IJ SOLN
INTRAMUSCULAR | Status: AC
  Filled 2023-07-01: qty 1

## 2023-07-01 NOTE — Discharge Instructions (Addendum)
 X-ray done of the lumbar spine and left hip.  Final evaluation by the radiologist is still pending but on brief evaluation there does not appear to be any acute fracture however there is likely chronic changes to the lumbar spine.  Once the radiologist has read the x-ray if there are any changes we will contact you.  The symptoms may be associated with the existing degenerative changes in the spine but could also be associated with some aspect of arthritis in the hip. Given the decreased range of motion in the hip, we recommend following up with orthopedics if this does not improve after several days of steroids. Recommend calling and talking to your primary care provider to discuss possible MRI of your lower back as recommended back in March given that physical therapy is not helping and the symptoms have worsened.  We have treated the symptoms today with the following: Toradol  injection given today. This is a medication to help with pain. This is not a narcotic.  Decadron  injection given today. This is a steroid to help with inflammation and pain. Prednisone  40 mg (2 tablets) once daily for 5 days. Take this in the morning.  This is a steroid to help with inflammation and pain.  Start this medication tomorrow, 5/12 Flexeril  5 mg every 8 hours as needed for muscle spasms.  Use caution as this medication can cause drowsiness.  Do not take this medication along with gabapentin  as it may cause oversedation. Contact your primary care physician next week to speak with them about the possibility of an MRI as recommended back in March If the left hip symptoms do not improve within the next few days then recommend following up with either your primary care physician or orthopedics.

## 2023-07-01 NOTE — ED Triage Notes (Signed)
 Patient presenting with back and left leg pain onset this past Friday. States on this day the leg has locked up. Was seen for this earlier and diagnosed with a L4 slid disk and sciatic nerve pain.  Prescriptions or OTC medications tried: Yes- Gabapentin , tylenol  1000 mg   with mild relief.

## 2023-07-01 NOTE — ED Provider Notes (Signed)
 [facility]    CSN: [id number] Arrival date & time: 07/01/23  1447      History   Chief Complaint Chief Complaint  Patient presents with   Back Pain   Leg Pain    HPI Peola Melley is a 43 y.o. female.   43 year old female who presents urgent care with complaints of increasing lumbar back pain now with decreased range of motion in the left hip with pain from the hip into the buttocks.  The patient reports that she has a known lumbar disc disease and has been told she has a "slipped disc".  She has seen her primary care physician for this and it was recommended that she try physical therapy first but that if this was not successful that she would need an MRI of her back.  She has been going to PT and doing the exercises with little improvement in her symptoms but they had not gotten worse until Friday.  She reports that she was going out to eat with her family and that she suddenly got significant pain in the left groin that radiated into her buttock, down her leg and through her back.  Since then she has had decreased range of motion of the hip and it feels like it is locking at times.  She reports that she already was moving slow due to her back but now any activity she does is taking an extended amount of time because she is in pain in her hip and having difficulty flexing or extending her hip.  Getting in and out of the car or in and out of bed is very difficult as is going to the bathroom.  She denies any loss of bowel or bladder.  She is not having any new numbness or tingling.  The leg does feel weaker on the left than on the right.  She denies fevers or chills.     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   Ulcerative colitis (HCC) 10/18/2020   Secondary hypertension - Steroid induced  01/09/2020   De Quervain's tenosynovitis 11/21/2017   Ganglion of left wrist 11/21/2017   IBD (inflammatory bowel disease) 12/15/2014   Situational anxiety 12/15/2014   Class 2 obesity 01/11/2013   Constipation 01/11/2013    Past Surgical History:  Procedure Laterality Date   COLONOSCOPY  11/2014, 03/2016   GANGLION CYST EXCISION Left 04/15/2018   Procedure: REMOVAL VOLAR GANGLION OF LEFT WRIST;  Surgeon: Brunilda Capra, MD;  Location: [facility];  Service: Orthopedics;  Laterality: Left;   TUBAL LIGATION     WISDOM TOOTH EXTRACTION      OB History     Gravida  4   Para      Term      Preterm      AB      Living  4  SAB      IAB      Ectopic      Multiple      Live Births  4            Home Medications    Prior to Admission medications   Medication Sig Start Date End Date Taking? Authorizing Provider  acetaminophen  (TYLENOL ) 500 MG tablet Take 2 tablets (1,000 mg total) by mouth every 6 (six) hours as needed. 09/09/21  Yes Starlene Eaton, FNP  cetirizine  (ZYRTEC ) 10 MG tablet Take 10 mg by mouth daily as needed for allergies.   Yes [provider]  Cholecalciferol  (D-3-5) 5000 units capsule Take 2 capsules (10,000 Units total) by mouth daily. Patient taking differently: Take 5,000 Units by mouth daily. 06/15/17  Yes [place], Marvell Slider, MD  fluticasone  (FLONASE ) 50 MCG/ACT nasal spray Place 1 spray into both nostrils daily as needed for allergies. 05/24/23  Yes Austine Lefort, MD  gabapentin  (NEURONTIN ) 300 MG capsule Take 1 capsule (300 mg total) by mouth 3 (three) times daily as needed  (nerve pain). 05/21/23  Yes Austine Lefort, MD  Upadacitinib  ER (RINVOQ ) 30 MG TB24 Take 1 tablet by mouth daily. 02/07/23  Yes Jegede, Olugbemiga E, MD  vitamin C  (ASCORBIC ACID) 500 MG tablet Take 1 tablet (500 mg total) by mouth daily. 06/15/17  Yes [place], Marvell Slider, MD  celecoxib  (CELEBREX ) 200 MG capsule Take 1 capsule (200 mg total) by mouth 2 (two) times daily. 05/21/23   Austine Lefort, MD  cyclobenzaprine  (FLEXERIL ) 10 MG tablet Take 1 tablet (10 mg total) by mouth 3 (three) times daily as needed for muscle spasms. 08/07/22   Austine Lefort, MD  polyethylene glycol (MIRALAX ) 17 g packet Take 17 g by mouth daily. Titrate as needed Patient taking differently: Take 17 g by mouth daily as needed. 11/30/20   Armbruster, Lendon Queen, MD    Family History Family History  Problem Relation Age of Onset   Rheum arthritis Mother    Immunodeficiency Father    Bullous pemphigoid Father    Lupus Sister    Colon cancer Other        cousins and uncles   Diabetes Other        several maternal relatives   Hypertension Other        several paternal and maternal relatives   Irritable bowel syndrome Other        mother   Breast cancer Maternal Great-grandmother    Colon polyps Neg Hx    Esophageal cancer Neg Hx    Rectal cancer Neg Hx    Stomach cancer Neg Hx     Social History Social History   Tobacco Use   Smoking status: Never   Smokeless tobacco: Never  Vaping Use   Vaping status: Never Used  Substance Use Topics   Alcohol use: Not Currently   Drug use: Yes    Frequency: 2.0 times per week    Types: Marijuana     Allergies   Flagyl  [metronidazole ] and Keflex [cephalexin]   Review of Systems Review of Systems  Constitutional:  Negative for chills and fever.  HENT:  Negative for ear pain and sore throat.   Eyes:  Negative for pain and visual disturbance.  Respiratory:  Negative for cough and shortness of breath.   Cardiovascular:  Negative for chest pain and  palpitations.  Gastrointestinal:  Negative for abdominal pain and vomiting.  Genitourinary:  Negative for dysuria and hematuria.  Musculoskeletal:  Positive for back pain. Negative for arthralgias.       Left groin pain radiating to the back with decreased range of motion of the hip  Skin:  Negative for color change and rash.  Neurological:  Negative for seizures and syncope.  All other systems reviewed and are negative.    Physical Exam Triage Vital Signs ED Triage Vitals  Encounter Vitals Group     BP 07/01/23 1502 137/87     Systolic BP Percentile --      Diastolic BP Percentile --      Pulse Rate 07/01/23 1502 (!) 118     Resp 07/01/23 1502 18     Temp 07/01/23 1502 98.2 F (36.8 C)     Temp Source 07/01/23 1502 Oral     SpO2 07/01/23 1502 97 %     Weight --      Height --      Head Circumference --      Peak Flow --      Pain Score 07/01/23 1458 8     Pain Loc --      Pain Education --      Exclude from Growth Chart --    No data found.  Updated Vital Signs BP 137/87 (BP Location: Right Arm)   Pulse (!) 118   Temp 98.2 F (36.8 C) (Oral)   Resp 18   LMP 06/25/2023 (Approximate)   SpO2 97%   Visual Acuity Right Eye Distance:   Left Eye Distance:   Bilateral Distance:    Right Eye Near:   Left Eye Near:    Bilateral Near:     Physical Exam Vitals and nursing note reviewed.  Constitutional:      General: She is not in acute distress.    Appearance: She is well-developed.     Comments: Patient appears uncomfortable, unable to sit, having to stand.  HENT:     Head: Normocephalic and atraumatic.  Eyes:     Conjunctiva/sclera: Conjunctivae normal.  Cardiovascular:     Rate and Rhythm: Normal rate and regular rhythm.     Heart sounds: No murmur heard. Pulmonary:     Effort: Pulmonary effort is normal. No respiratory distress.     Breath sounds: Normal breath sounds.  Abdominal:     Palpations: Abdomen is soft.     Tenderness: There is no abdominal  tenderness.  Musculoskeletal:        General: No swelling.     Cervical back: Neck supple.     Lumbar back: Tenderness present. No deformity. Decreased range of motion. Positive right straight leg raise test and positive left straight leg raise test.     Left hip: Tenderness (With movement in the groin) present. Decreased range of motion. Normal strength.     Left foot: Normal pulse.  Skin:    General: Skin is warm and dry.     Capillary Refill: Capillary refill takes less than 2 seconds.  Neurological:     Mental Status: She is alert.  Psychiatric:        Mood and Affect: Mood normal.     UC Treatments / Results  Labs (all labs ordered are listed, but only abnormal results are displayed) Labs Reviewed - No data to display  EKG   Radiology No results found.  Procedures Procedures (including critical care time)  Medications Ordered in UC Medications - No data to display  Initial Impression / Assessment and Plan / UC Course  I have reviewed the triage vital signs and the nursing notes.  Pertinent labs & imaging results that were available during my care of the patient were reviewed by me and considered in my medical decision making (see chart for details).     Acute bilateral low back pain with bilateral sciatica - Plan: DG Lumbar Spine Complete, DG Lumbar Spine Complete  Left hip pain - Plan: DG Hip Unilat With Pelvis 2-3 Views Left, DG Hip Unilat With Pelvis 2-3 Views Left   X-ray done of the lumbar spine and left hip.  Final evaluation by the radiologist is still pending but on brief evaluation there does not appear to be any acute fracture however there is likely chronic changes to the lumbar spine.  Once the radiologist has read the x-ray if there are any changes we will contact you.  The symptoms may be associated with the existing degenerative changes in the spine but could also be associated with some aspect of arthritis in the hip. Given the decreased range of  motion in the hip, we recommend following up with orthopedics if this does not improve after several days of steroids. Recommend calling and talking to your primary care provider to discuss possible MRI of your lower back as recommended back in March given that physical therapy is not helping and the symptoms have worsened.  We have treated the symptoms today with the following: Toradol  injection given today. This is a medication to help with pain. This is not a narcotic.  Decadron  injection given today. This is a steroid to help with inflammation and pain. Prednisone  40 mg (2 tablets) once daily for 5 days. Take this in the morning.  This is a steroid to help with inflammation and pain.  Start this medication tomorrow, 5/12 Flexeril  5 mg every 8 hours as needed for muscle spasms.  Use caution as this medication can cause drowsiness.  Do not take this medication along with gabapentin  as it may cause oversedation. Contact your primary care physician next week to speak with them about the possibility of an MRI as recommended back in March If the left hip symptoms do not improve within the next few days then recommend following up with either your primary care physician or orthopedics.  Final Clinical Impressions(s) / UC Diagnoses   Final diagnoses:  None   Discharge Instructions   None    ED Prescriptions   None    PDMP not reviewed this encounter.   Kreg Pesa, [place] 07/01/23 1734

## 2023-07-02 ENCOUNTER — Other Ambulatory Visit: Payer: Self-pay | Admitting: Pharmacy Technician

## 2023-07-02 ENCOUNTER — Other Ambulatory Visit: Payer: Self-pay

## 2023-07-02 ENCOUNTER — Encounter: Payer: Self-pay | Admitting: Family Medicine

## 2023-07-02 NOTE — Progress Notes (Signed)
 Specialty Pharmacy Refill Coordination Note  Theresa Farmer is a 43 y.o. female contacted today regarding refills of specialty medication(s) Upadacitinib  (Rinvoq )   Patient requested (Patient-Rptd) Delivery   Delivery date: (Patient-Rptd) 07/13/23   Verified address: (Patient-Rptd) [address] [place] [address]   Medication will be filled on 07/12/23.

## 2023-07-03 ENCOUNTER — Other Ambulatory Visit: Payer: Self-pay | Admitting: Family Medicine

## 2023-07-03 ENCOUNTER — Encounter: Payer: Self-pay | Admitting: Family Medicine

## 2023-07-03 ENCOUNTER — Encounter (HOSPITAL_COMMUNITY): Admitting: Physical Therapy

## 2023-07-03 DIAGNOSIS — M5416 Radiculopathy, lumbar region: Secondary | ICD-10-CM

## 2023-07-06 ENCOUNTER — Encounter (HOSPITAL_COMMUNITY)

## 2023-07-09 ENCOUNTER — Encounter (HOSPITAL_COMMUNITY): Admitting: Physical Therapy

## 2023-07-10 ENCOUNTER — Ambulatory Visit
Admission: RE | Admit: 2023-07-10 | Discharge: 2023-07-10 | Disposition: A | Discharge status: Home / Self Care | Source: Ambulatory Visit | Attending: Family Medicine | Admitting: Family Medicine

## 2023-07-10 ENCOUNTER — Other Ambulatory Visit (HOSPITAL_COMMUNITY): Payer: Self-pay

## 2023-07-10 ENCOUNTER — Telehealth: Payer: Self-pay

## 2023-07-10 DIAGNOSIS — M4726 Other spondylosis with radiculopathy, lumbar region: Secondary | ICD-10-CM | POA: Diagnosis not present

## 2023-07-10 DIAGNOSIS — M5416 Radiculopathy, lumbar region: Secondary | ICD-10-CM

## 2023-07-10 DIAGNOSIS — M5126 Other intervertebral disc displacement, lumbar region: Secondary | ICD-10-CM | POA: Diagnosis not present

## 2023-07-10 NOTE — Telephone Encounter (Signed)
 Pharmacy Patient Advocate Encounter   Received notification from CoverMyMeds that prior authorization for Rinvoq  30MG  er tablets is required/requested.   Insurance verification completed.   The patient is insured through [facility] .   Per test claim: PA required; PA started via CoverMyMeds. KEY [id number] . Waiting for clinical questions to populate.

## 2023-07-10 NOTE — Telephone Encounter (Signed)
 Pharmacy Patient Advocate Encounter   Received notification from CoverMyMeds that prior authorization for Rinvoq  30MG  er tablets is required/requested.   Insurance verification completed.   The patient is insured through [facility] .   Per test claim: PA required; PA submitted to above mentioned insurance via CoverMyMeds Key/confirmation #/EOC [id number]  Status is pending

## 2023-07-12 NOTE — Telephone Encounter (Signed)
 Pharmacy Patient Advocate Encounter  Received notification from [facility] that Prior Authorization for Rinvoq  30MG  er tablets has been APPROVED from 07-22-2023 to 07-20-2024   PA #/Case ID/Reference #: [id number]    Additional authorization for RINVOQ  ER 15mg  tablet was approved, effective 07-22-2023 to 07-20-2024

## 2023-07-13 ENCOUNTER — Encounter (HOSPITAL_COMMUNITY): Payer: Self-pay

## 2023-07-13 ENCOUNTER — Encounter (HOSPITAL_COMMUNITY)

## 2023-07-13 NOTE — Therapy (Signed)
 [facility] Outpatient Rehabilitation at [facility] [address] [place], [place], [address] Phone: [phone]   Fax:  [phone]  Patient Details  Name: Theresa Farmer MRN: [id number] Date of Birth: October 12, 1980 Referring Provider:  No ref. provider found  Encounter Date: 07/13/2023  Pt was called concerning her missed appointment this morning, states she was waiting to hear back from doctor concerning MRI results before coming back to therapy. Pt was instructed to call back Monday to confirm appointment for Tuesday we will hope to see her then.  Armond Bertin, PT, DPT [facility] Office: [phone] 1:58 PM, 07/13/23   [facility] Outpatient Rehabilitation at [facility] [address] [place], [place], [address] Phone: [phone]   Fax:  [phone]

## 2023-07-17 ENCOUNTER — Encounter (HOSPITAL_COMMUNITY)

## 2023-07-19 ENCOUNTER — Encounter (HOSPITAL_COMMUNITY)

## 2023-07-19 ENCOUNTER — Encounter (HOSPITAL_COMMUNITY): Payer: Self-pay

## 2023-07-19 NOTE — Therapy (Signed)
 [facility] Outpatient Rehabilitation at [facility] [address] [place], [place], [address] Phone: [phone]   Fax:  [phone]  Patient Details  Name: Theresa Farmer MRN: [id number] Date of Birth: 12-15-1980 Referring Provider:  No ref. provider found  Encounter Date: 07/19/2023  PHYSICAL THERAPY DISCHARGE SUMMARY  Visits from Start of Care: 2  Current functional level related to goals / functional outcomes: Pt not returned for tracking of progression.   Remaining deficits: Pt has not returned, assume same deficits as evaluation.   Education / Equipment: Pt educated on process of coming back to therapy if she would like to come back.   Patient agrees to discharge. Patient goals were not met. Patient is being discharged due to not returning since the last visit.  Patient called and asked to be discharged because she is having to work some things out with her MD. Patient to be discharged at this time.  Armond Bertin, PT, DPT [facility] Office: [phone] 7:40 AM, 07/19/23   [facility] Outpatient Rehabilitation at [facility] [address] [place], [place], [address] Phone: [phone]   Fax:  [phone]

## 2023-07-24 ENCOUNTER — Encounter (HOSPITAL_COMMUNITY)

## 2023-07-26 ENCOUNTER — Encounter (HOSPITAL_COMMUNITY)

## 2023-07-27 ENCOUNTER — Ambulatory Visit: Payer: Self-pay | Admitting: Family Medicine

## 2023-07-31 ENCOUNTER — Encounter: Payer: Self-pay | Admitting: Pharmacist

## 2023-07-31 ENCOUNTER — Ambulatory Visit: Attending: Family Medicine | Admitting: Pharmacist

## 2023-07-31 ENCOUNTER — Encounter (HOSPITAL_COMMUNITY)

## 2023-07-31 ENCOUNTER — Other Ambulatory Visit: Payer: Self-pay

## 2023-07-31 DIAGNOSIS — Z79899 Other long term (current) drug therapy: Secondary | ICD-10-CM

## 2023-07-31 NOTE — Progress Notes (Signed)
  S: Patient presents today for review of their specialty medication.   Patient is currently taking Rinvoq  for UC. Patient is managed by Dr. General Kenner for this.   Dosing: Adult  Note: May be used as monotherapy or in combination with methotrexate  or other nonbiologic disease-modifying antirheumatic drugs (DMARDs); use in combination with biologic DMARDS or potent immunosuppressants (eg, azathioprine, cyclosporine) is not recommended. Do not initiate therapy in patients with an absolute lymphocyte count <500/mm3, ANC <1,000/mm3, or hemoglobin <8 g/dL. UC: Oral: 15 mg once daily.  Adherence: confirms  Efficacy: reports that she feels the Rinvoq  is effective in controlling her symptoms. Did miss therapy for a couple of weeks early in 2025 before she has a c-scope that revealed mild inflammation. GI is following and plans to test fecal calprotectin within the next few months.   Monitoring: S/sx thromboembolism: none  S/sx malignancy: none  S/sx of infection: none   Current adverse effects: none   O:     Lab Results  Component Value Date   WBC 9.0 05/21/2023   HGB 12.9 05/21/2023   HCT 40.1 05/21/2023   MCV 81.8 05/21/2023   PLT 332 05/21/2023      Chemistry      Component Value Date/Time   NA 138 05/21/2023 1057   K 4.7 05/21/2023 1057   CL 105 05/21/2023 1057   CO2 25 05/21/2023 1057   BUN 16 05/21/2023 1057   CREATININE 1.10 (H) 05/21/2023 1057      Component Value Date/Time   CALCIUM 9.3 05/21/2023 1057   ALKPHOS 76 02/06/2023 1449   AST 15 05/21/2023 1057   ALT 11 05/21/2023 1057   BILITOT 0.4 05/21/2023 1057       Lab Results  Component Value Date   CHOL 169 05/21/2023   HDL 41 (L) 05/21/2023   LDLCALC 114 (H) 05/21/2023   TRIG 62 05/21/2023   CHOLHDL 4.1 05/21/2023     A/P: 1. Medication review: patient currently taking Rinvoq  for UC. Reviewed the medication with the patient, including the following: Rinvoq  is a medication used to treat UC. Administer  with or without food. Swallow tablet whole; do not crush, split, or chew. Possible adverse effects include increased risk of infection, GI upset, hematologic toxicity, hepatic effects, lipid abnormalities, increased risk of malignancy, thromboembolism. Avoid live vaccinations. No recommendations for any changes.  Marene Shape, PharmD, Becky Bowels, CPP Clinical Pharmacist [facility] [phone]

## 2023-07-31 NOTE — Progress Notes (Signed)
 Specialty Pharmacy Refill Coordination Note  Theresa Farmer is a 43 y.o. female contacted today regarding refills of specialty medication(s) Upadacitinib  (Rinvoq )   Patient requested Delivery   Delivery date: 08/08/23   Verified address: [address] [place] [address]   Medication will be filled on 08/07/23.

## 2023-07-31 NOTE — Progress Notes (Signed)
 Specialty Pharmacy Ongoing Clinical Assessment Note  Theresa Farmer is a 43 y.o. female who is being followed by the specialty pharmacy service for RxSp Ulcerative Colitis   Patient's specialty medication(s) reviewed today: Upadacitinib  (Rinvoq )   Missed doses in the last 4 weeks: 0   Patient/Caregiver did not have any additional questions or concerns.   Therapeutic benefit summary: Patient is achieving benefit   Adverse events/side effects summary: No adverse events/side effects   Patient's therapy is appropriate to: Continue    Goals Addressed             This Visit's Progress    Minimize recurrence of flares   On track    Patient is on track. Patient will maintain adherence. Per visit on 05/30/23, pt is clinically doing better on Rinvoq . Colonoscopy showed mild inflammation, but attributed to missing doses of medication while ill. MD plans to check for degree of inflammation in a few months.         Follow up: 12 months  [facility]

## 2023-08-02 ENCOUNTER — Other Ambulatory Visit: Payer: Self-pay

## 2023-08-02 ENCOUNTER — Encounter (HOSPITAL_COMMUNITY)

## 2023-08-02 DIAGNOSIS — M51369 Other intervertebral disc degeneration, lumbar region without mention of lumbar back pain or lower extremity pain: Secondary | ICD-10-CM

## 2023-08-02 DIAGNOSIS — M47819 Spondylosis without myelopathy or radiculopathy, site unspecified: Secondary | ICD-10-CM

## 2023-08-02 DIAGNOSIS — M47816 Spondylosis without myelopathy or radiculopathy, lumbar region: Secondary | ICD-10-CM

## 2023-08-09 ENCOUNTER — Ambulatory Visit: Admitting: Family Medicine

## 2023-08-09 ENCOUNTER — Encounter: Payer: Self-pay | Admitting: Family Medicine

## 2023-08-09 VITALS — BP 124/80 | HR 76 | Temp 98.7°F | Ht 66.0 in | Wt 229.4 lb

## 2023-08-09 DIAGNOSIS — M5416 Radiculopathy, lumbar region: Secondary | ICD-10-CM | POA: Diagnosis not present

## 2023-08-09 DIAGNOSIS — M51369 Other intervertebral disc degeneration, lumbar region without mention of lumbar back pain or lower extremity pain: Secondary | ICD-10-CM | POA: Diagnosis not present

## 2023-08-09 MED ORDER — CELECOXIB 200 MG PO CAPS: 200.0000 mg | ORAL_CAPSULE | Freq: Two times a day (BID) | ORAL | 3 refills | Status: DC

## 2023-08-09 NOTE — Progress Notes (Signed)
 Subjective:    Patient ID: Theresa Farmer, female    DOB: 08/17/1980, 43 y.o.   MRN: [id number]  Back Pain  Patient has been dealing with low back pain and left-sided sciatica for almost a year.  I first saw her in June 2020 for.  We try the patient on physical therapy but she states that that made the pain worse.  Recently we schedule the patient for an MRI and the results are listed below: IMPRESSION: There is a broad-based bulge and moderate facet arthrosis at L4-5. This results in crowding the descending nerve roots in the lateral recess and mild caudal foraminal narrowing, right slightly greater than left. Correlation for mild descending and exiting radicular symptoms  Patient is awaiting the referral for an epidural steroid injection.  She is currently taking 1000 mg of Tylenol  twice a day.  She takes Flexeril  and gabapentin  when necessary.  However the Flexeril  and gabapentin  make her dizzy and lightheaded.  She went to an urgent care and was given Toradol  which helped significantly.  She is questioning if she can take something similar to Toradol .  She is not currently taking her Celebrex . Past Medical History:  Diagnosis Date   Allergy    Endometriosis    Gastritis    GERD (gastroesophageal reflux disease)    H/O seasonal allergies    IBS (irritable bowel syndrome)    Indeterminate colitis    Ulcerative colitis (HCC)    Past Surgical History:  Procedure Laterality Date   COLONOSCOPY  11/2014, 03/2016   GANGLION CYST EXCISION Left 04/15/2018   Procedure: REMOVAL VOLAR GANGLION OF LEFT WRIST;  Surgeon: Brunilda Capra, MD;  Location: [facility];  Service: Orthopedics;  Laterality: Left;   TUBAL LIGATION     WISDOM TOOTH EXTRACTION     Current Outpatient Medications on File Prior to Visit  Medication Sig Dispense Refill   acetaminophen  (TYLENOL ) 500 MG tablet Take 2 tablets (1,000 mg total) by mouth every 6 (six) hours as needed. 30 tablet 0   cetirizine  (ZYRTEC )  10 MG tablet Take 10 mg by mouth daily as needed for allergies.     Cholecalciferol  (D-3-5) 5000 units capsule Take 2 capsules (10,000 Units total) by mouth daily.     cyclobenzaprine  (FLEXERIL ) 5 MG tablet Take 1 tablet (5 mg total) by mouth every 8 (eight) hours as needed for muscle spasms. 30 tablet 0   fluticasone  (FLONASE ) 50 MCG/ACT nasal spray Place 1 spray into both nostrils daily as needed for allergies. 48 g 2   gabapentin  (NEURONTIN ) 300 MG capsule Take 1 capsule (300 mg total) by mouth 3 (three) times daily as needed (nerve pain). 30 capsule 3   polyethylene glycol (MIRALAX ) 17 g packet Take 17 g by mouth daily. Titrate as needed 14 each 0   Upadacitinib  ER (RINVOQ ) 30 MG TB24 Take 1 tablet by mouth daily. 90 tablet 3   vitamin C  (ASCORBIC ACID) 500 MG tablet Take 1 tablet (500 mg total) by mouth daily.     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    Comment: tubal  Other Topics Concern   Not on file  Social History Narrative   Not on file   Social Drivers of Health   Financial Resource Strain: Not on file  Food Insecurity: Not on file  Transportation Needs: Not on file  Physical Activity:  Not on file  Stress: Not on file  Social Connections: Not on file  Intimate Partner Violence: Not on file      Review of Systems  Musculoskeletal:  Positive for back pain.  All other systems reviewed and are negative.      Objective:   Physical Exam Vitals reviewed.  Constitutional:      General: She is not in acute distress.    Appearance: Normal appearance. She is obese. She is not ill-appearing, toxic-appearing or diaphoretic.   Cardiovascular:     Rate and Rhythm: Normal rate and regular rhythm.     Heart sounds: Normal heart sounds. No murmur heard.    No friction rub. No gallop.  Pulmonary:     Effort: Pulmonary effort is normal. No respiratory distress.     Breath sounds: Normal breath sounds. No wheezing, rhonchi or rales.   Musculoskeletal:     Lumbar back: Spasms present. No tenderness or bony tenderness. Decreased range of motion. Negative right straight leg raise test and negative left straight leg raise test.     Right lower leg: No edema.     Left lower leg: No edema.   Neurological:     Mental Status: She is alert.           Assessment & Plan:   Bulging of intervertebral disc between L4 and L5  Lumbar radiculopathy, chronic Patient is awaiting a referral for an epidural steroid injection.  In the meantime I recommended that she resume Celebrex  200 mg twice daily in addition to her gabapentin  for neuropathic pain.  She can use Flexeril  for muscle spasms of her back and continue with Tylenol  which she is currently using

## 2023-08-27 ENCOUNTER — Other Ambulatory Visit: Payer: Self-pay

## 2023-08-29 ENCOUNTER — Other Ambulatory Visit: Payer: Self-pay | Admitting: Pharmacy Technician

## 2023-08-29 ENCOUNTER — Other Ambulatory Visit: Payer: Self-pay

## 2023-08-29 NOTE — Progress Notes (Signed)
 Specialty Pharmacy Refill Coordination Note  Theresa Farmer is a 43 y.o. female contacted today regarding refills of specialty medication(s) Upadacitinib  (Rinvoq )   Patient requested Delivery   Delivery date: 09/10/23   Verified address: [address]   [place] [address]   Medication will be filled on 09/07/23.

## 2023-09-07 ENCOUNTER — Other Ambulatory Visit: Payer: Self-pay

## 2023-09-21 ENCOUNTER — Encounter (HOSPITAL_COMMUNITY): Payer: Self-pay

## 2023-09-21 ENCOUNTER — Ambulatory Visit (HOSPITAL_COMMUNITY)
Admission: EM | Admit: 2023-09-21 | Discharge: 2023-09-21 | Disposition: A | Discharge status: Home / Self Care | Attending: Nurse Practitioner | Admitting: Nurse Practitioner

## 2023-09-21 DIAGNOSIS — M5432 Sciatica, left side: Secondary | ICD-10-CM | POA: Diagnosis not present

## 2023-09-21 DIAGNOSIS — M51369 Other intervertebral disc degeneration, lumbar region without mention of lumbar back pain or lower extremity pain: Secondary | ICD-10-CM

## 2023-09-21 MED ORDER — KETOROLAC TROMETHAMINE 60 MG/2ML IM SOLN
60.0000 mg | Freq: Once | INTRAMUSCULAR | Status: AC
  Administered 2023-09-21: 60 mg via INTRAMUSCULAR

## 2023-09-21 MED ORDER — PREDNISONE 10 MG (21) PO TBPK: ORAL_TABLET | Freq: Every day | ORAL | 0 refills | Status: DC

## 2023-09-21 MED ORDER — KETOROLAC TROMETHAMINE 60 MG/2ML IM SOLN
INTRAMUSCULAR | Status: AC
  Filled 2023-09-21: qty 2

## 2023-09-21 MED ORDER — KETOROLAC TROMETHAMINE 30 MG/ML IJ SOLN
INTRAMUSCULAR | Status: AC
  Filled 2023-09-21: qty 1

## 2023-09-21 NOTE — ED Triage Notes (Signed)
 Pt states lower back pain that radiates down her her left leg. States she has had this pain since 11/2022.  States she has been taking Tylenol  during the day and Gabapentin  at bedtime which helps some with the pain. States she has a referral in for and epidural  but doesn't have an appointment yet.

## 2023-09-21 NOTE — ED Provider Notes (Signed)
 [facility]    CSN: [id number] Arrival date & time: 09/21/23  9188      History   Chief Complaint Chief Complaint  Patient presents with   Back Pain    HPI Theresa Farmer is a 43 y.o. female.   Discussed the use of AI scribe software for clinical note transcription with the patient, who gave verbal consent to proceed.   Patient presents with worsening severe low back pain radiating down the left leg, significantly impairing her ability to perform daily tasks, particularly travel required for her job. She reports that a recent 3-hour drive to a work conference exacerbated her symptoms. She was initially evaluated on 07/01/2023 at urgent care, where lumbar spine and left hip x-rays were performed. At that time, she received Toradol  and a steroid injection in clinic and was prescribed an oral steroid taper and Flexeril  as needed, which provided notable symptom relief. However, her pain has intensified in recent days, likely due to prolonged travel. Subsequent MRI revealed a broad-based disc bulge with moderate facet arthrosis at L4-L5, resulting in crowding of the descending nerve roots. Her PCP placed a referral to interventional radiology on 08/02/2023 for a possible epidural steroid injection, but the patient reports no contact from the department despite follow-up efforts, citing staffing issues. She last saw her PCP on 08/09/2023, who confirmed the referral was still pending. In the interim, she was advised to take Celebrex  200 mg twice daily, gabapentin  three times daily, Flexeril  as needed, and Tylenol  as needed. Due to medication sensitivity and work-related driving, she only takes Celebrex  and gabapentin  at night and uses Tylenol  during the day. She avoids Flexeril  entirely. She denies any bowel or bladder incontinence, lower extremity numbness, dysuria, or hematuria.  The following portions of the patient's history were reviewed and updated as appropriate: allergies, current  medications, past family history, past medical history, past social history, past surgical history, and problem list.      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   Ulcerative colitis (HCC) 10/18/2020   Secondary hypertension - Steroid induced  01/09/2020   De Quervain's tenosynovitis 11/21/2017   Ganglion of left wrist 11/21/2017   IBD (inflammatory bowel disease) 12/15/2014   Situational anxiety 12/15/2014   Class 2 obesity 01/11/2013   Constipation 01/11/2013    Past Surgical History:  Procedure Laterality Date   COLONOSCOPY  11/2014, 03/2016   GANGLION CYST EXCISION Left 04/15/2018   Procedure: REMOVAL VOLAR GANGLION OF LEFT WRIST;  Surgeon: Murrell Drivers, MD;  Location: [facility];  Service: Orthopedics;  Laterality: Left;   TUBAL LIGATION     WISDOM TOOTH EXTRACTION      OB History     Gravida  4   Para      Term      Preterm      AB      Living  4      SAB      IAB      Ectopic      Multiple      Live Births  4  Home Medications    Prior to Admission medications   Medication Sig Start Date End Date Taking? Authorizing Provider  predniSONE  (STERAPRED UNI-PAK 21 TAB) 10 MG (21) TBPK tablet Take by mouth daily. Take 6 tabs by mouth daily  for 2 days, then 5 tabs for 2 days, then 4 tabs for 2 days, then 3 tabs for 2 days, 2 tabs for 2 days, then 1 tab by mouth daily for 2 days 09/21/23  Yes Iola Lukes, FNP  acetaminophen  (TYLENOL ) 500 MG tablet Take 2 tablets (1,000 mg total) by mouth every 6 (six) hours as needed. 09/09/21   Enedelia Dorna HERO, FNP  celecoxib  (CELEBREX ) 200 MG capsule Take 1  capsule (200 mg total) by mouth 2 (two) times daily. 08/09/23   Duanne Butler DASEN, MD  cetirizine  (ZYRTEC ) 10 MG tablet Take 10 mg by mouth daily as needed for allergies.    [provider]  Cholecalciferol  (D-3-5) 5000 units capsule Take 2 capsules (10,000 Units total) by mouth daily. 06/15/17   Bari Theodoro FALCON, MD  cyclobenzaprine  (FLEXERIL ) 5 MG tablet Take 1 tablet (5 mg total) by mouth every 8 (eight) hours as needed for muscle spasms. 07/01/23   White, Elizabeth A, PA-C  fluticasone  (FLONASE ) 50 MCG/ACT nasal spray Place 1 spray into both nostrils daily as needed for allergies. 05/24/23   Duanne Butler DASEN, MD  gabapentin  (NEURONTIN ) 300 MG capsule Take 1 capsule (300 mg total) by mouth 3 (three) times daily as needed (nerve pain). 05/21/23   Duanne Butler DASEN, MD  polyethylene glycol (MIRALAX ) 17 g packet Take 17 g by mouth daily. Titrate as needed 11/30/20   Armbruster, Elspeth SQUIBB, MD  Upadacitinib  ER (RINVOQ ) 30 MG TB24 Take 1 tablet by mouth daily. 02/07/23   Jegede, Olugbemiga E, MD  vitamin C  (ASCORBIC ACID) 500 MG tablet Take 1 tablet (500 mg total) by mouth daily. 06/15/17   Bari Theodoro FALCON, MD    Family History Family History  Problem Relation Age of Onset   Rheum arthritis Mother    Immunodeficiency Father    Bullous pemphigoid Father    Lupus Sister    Colon cancer Other        cousins and uncles   Diabetes Other        several maternal relatives   Hypertension Other        several paternal and maternal relatives   Irritable bowel syndrome Other        mother   Breast cancer Maternal Great-grandmother    Colon polyps Neg Hx    Esophageal cancer Neg Hx    Rectal cancer Neg Hx    Stomach cancer Neg Hx     Social History Social History   Tobacco Use   Smoking status: Never   Smokeless tobacco: Never  Vaping Use   Vaping status: Never Used  Substance Use Topics   Alcohol use: Not Currently   Drug use: Yes    Frequency: 2.0 times per week    Types:  Marijuana     Allergies   Flagyl  [metronidazole ] and Keflex [cephalexin]   Review of Systems Review of Systems  Gastrointestinal:        No bowel incontinence  Genitourinary:  Negative for hematuria.       No bladder incontinence  Musculoskeletal:  Positive for back pain. Negative for gait problem.  Neurological:  Negative for weakness and numbness.  All other systems reviewed and are negative.    Physical Exam Triage Vital  Signs ED Triage Vitals  Encounter Vitals Group     BP 09/21/23 0838 (!) 145/95     Girls Systolic BP Percentile --      Girls Diastolic BP Percentile --      Boys Systolic BP Percentile --      Boys Diastolic BP Percentile --      Pulse Rate 09/21/23 0838 68     Resp 09/21/23 0838 16     Temp 09/21/23 0836 98.1 F (36.7 C)     Temp Source 09/21/23 0836 Oral     SpO2 09/21/23 0838 98 %     Weight --      Height --      Head Circumference --      Peak Flow --      Pain Score 09/21/23 0838 7     Pain Loc --      Pain Education --      Exclude from Growth Chart --    No data found.  Updated Vital Signs BP (!) 145/95 (BP Location: Left Arm)   Pulse 68   Temp 98.1 F (36.7 C) (Oral)   Resp 16   SpO2 98%   Visual Acuity Right Eye Distance:   Left Eye Distance:   Bilateral Distance:    Right Eye Near:   Left Eye Near:    Bilateral Near:     Physical Exam Vitals reviewed.  Constitutional:      General: She is not in acute distress.    Appearance: Normal appearance. She is well-developed. She is not ill-appearing, toxic-appearing or diaphoretic.     Comments: Patient obviously uncomfortable.  Unable to sit and prefers to stand during exam.  HENT:     Head: Normocephalic.     Mouth/Throat:     Mouth: Mucous membranes are moist.  Cardiovascular:     Rate and Rhythm: Normal rate and regular rhythm.  Pulmonary:     Effort: Pulmonary effort is normal.     Breath sounds: Normal breath sounds.  Abdominal:     Palpations: Abdomen is  soft.     Tenderness: There is no right CVA tenderness or left CVA tenderness.  Musculoskeletal:     Cervical back: Normal, normal range of motion and neck supple.     Thoracic back: Normal.     Lumbar back: Tenderness present. No swelling, deformity, lacerations or spasms. Normal range of motion. Positive left straight leg raise test. Negative right straight leg raise test.  Skin:    General: Skin is warm and dry.  Neurological:     General: No focal deficit present.     Mental Status: She is alert and oriented to person, place, and time.     Cranial Nerves: Cranial nerves 2-12 are intact.     Sensory: Sensation is intact.     Motor: Motor function is intact. No weakness.     Coordination: Coordination is intact.     Gait: Gait is intact.  Psychiatric:        Mood and Affect: Mood normal.        Speech: Speech normal.        Behavior: Behavior normal. Behavior is cooperative.      UC Treatments / Results  Labs (all labs ordered are listed, but only abnormal results are displayed) Labs Reviewed - No data to display  EKG   Radiology No results found.  Procedures Procedures (including critical care time)  Medications Ordered in UC Medications  ketorolac  (  TORADOL ) injection 60 mg (60 mg Intramuscular Given 09/21/23 0917)    Initial Impression / Assessment and Plan / UC Course  I have reviewed the triage vital signs and the nursing notes.  Pertinent labs & imaging results that were available during my care of the patient were reviewed by me and considered in my medical decision making (see chart for details).     Patient presents with chronic low back pain with radiation down the left leg consistent with sciatica. MRI from May 20th confirmed a bulging disc at L4-L5. A referral for epidural steroid injection was placed by the patient's PCP on June 12th, but no progress has been made. Patient has been prescribed Celebrex , gabapentin , Flexeril , and Tylenol , but reports only  taking Celebrex  and gabapentin  at night and Tylenol  during the day due to sensitivity to medications and driving requirements for work. A Toradol  injection was administered today for pain relief, and a short course of oral prednisone  was prescribed. Patient advised to continue current medication regimen as tolerated and follow up with her PCP and Interventional Radiology regarding the initial referral. Information for [facility] was provided in case an additional referral to pain management is needed. Patient should seek emergency care for loss of bowel or bladder control, severe worsening of pain, numbness or weakness in the legs, or if symptoms become disabling.  Today's evaluation has revealed no signs of a dangerous process. Discussed diagnosis with patient and/or guardian. Patient and/or guardian aware of their diagnosis, possible red flag symptoms to watch out for and need for close follow up. Patient and/or guardian understands verbal and written discharge instructions. Patient and/or guardian comfortable with plan and disposition.  Patient and/or guardian has a clear mental status at this time, good insight into illness (after discussion and teaching) and has clear judgment to make decisions regarding their care  Documentation was completed with the aid of voice recognition software. Transcription may contain typographical errors.   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 Today you received a Toradol  injection for pain relief and were prescribed a short course of oral steroids (prednisone ). Continue taking your current medications--Celebrex , gabapentin , and Tylenol --as tolerated, and only at times that are safe for your  daily activities, such as driving. Use heat or ice packs to manage discomfort and avoid heavy lifting or prolonged sitting. Follow up with your primary care provider to check on the status of your referral for a steroid injection through interventional radiology. You were also given contact information for [facility] in case an additional referral is needed for pain management. Contact your PCP if pain persists, worsens, or interferes with daily functioning. Seek emergency care if you experience loss of bladder or bowel control, new or worsening leg numbness or weakness, or severe pain that does not respond to medications.      ED Prescriptions     Medication Sig Dispense Auth. Provider   predniSONE  (STERAPRED UNI-PAK 21 TAB) 10 MG (21) TBPK tablet Take by mouth daily. Take 6 tabs by mouth daily  for 2 days, then 5 tabs for 2 days, then 4 tabs for 2 days, then 3 tabs for 2 days,  2 tabs for 2 days, then 1 tab by mouth daily for 2 days 42 tablet Iola Lukes, FNP      PDMP not reviewed this encounter.   Iola Waverly, [place] 09/21/23 [phone]

## 2023-09-21 NOTE — Discharge Instructions (Addendum)
 You are being treated for chronic low back pain with sciatica, likely due to a bulging disc at the L4-L5 level. Today you received a Toradol  injection for pain relief and were prescribed a short course of oral steroids (prednisone ). Continue taking your current medications--Celebrex , gabapentin , and Tylenol --as tolerated, and only at times that are safe for your daily activities, such as driving. Use heat or ice packs to manage discomfort and avoid heavy lifting or prolonged sitting. Follow up with your primary care provider to check on the status of your referral for a steroid injection through interventional radiology. You were also given contact information for [facility] in case an additional referral is needed for pain management. Contact your PCP if pain persists, worsens, or interferes with daily functioning. Seek emergency care if you experience loss of bladder or bowel control, new or worsening leg numbness or weakness, or severe pain that does not respond to medications.

## 2023-09-24 ENCOUNTER — Ambulatory Visit: Admitting: Family Medicine

## 2023-09-24 ENCOUNTER — Telehealth: Payer: Self-pay | Admitting: Family Medicine

## 2023-09-24 NOTE — Telephone Encounter (Signed)
 Patient stated she's had elevated blood pressure since her first trip to the mountains on Monday, 09/17/23 - 09/20/23. Patient was in the mountains in [place]; stated her blood pressure used to be normal (120/80), but hasn't come down since she came back.   Patient also stated she has a bulging disk and was having pain during the trip and isn't sure if that was a contributing factor; patient stated she thinks it could also be the altitude blood pressure sickness.   Patient's blood pressure readings have been as follows (all morning readings around 7am while resting in bed):  Sat 8/2 - 154/98 [place] 8/3 153/86 Mon 8/4 143/90  Patient stated she sees the referral for her to have an epidural in MyChart which was issued in August, but it hasn't been received at the facility she was referred to. Patient is requesting for referral to be sent so she can get a handle on the back pain which she believes may also address the elevated blood pressure.  Gave patient the number of the referral coordinator; advised her to reach out and that I would also include the referral coordinator in the message.    Please advise patient at [phone].

## 2023-09-25 ENCOUNTER — Telehealth: Payer: Self-pay

## 2023-09-25 ENCOUNTER — Encounter: Payer: Self-pay | Admitting: Family Medicine

## 2023-09-25 ENCOUNTER — Other Ambulatory Visit: Payer: Self-pay

## 2023-09-25 ENCOUNTER — Ambulatory Visit: Admitting: Family Medicine

## 2023-09-25 VITALS — BP 146/96 | HR 69 | Temp 98.1°F | Ht 66.0 in | Wt 228.0 lb

## 2023-09-25 DIAGNOSIS — I10 Essential (primary) hypertension: Secondary | ICD-10-CM | POA: Diagnosis not present

## 2023-09-25 MED ORDER — LOSARTAN POTASSIUM 50 MG PO TABS: 50.0000 mg | ORAL_TABLET | Freq: Every day | ORAL | 3 refills | Status: DC

## 2023-09-25 MED ORDER — LOSARTAN POTASSIUM 50 MG PO TABS: 50.0000 mg | ORAL_TABLET | Freq: Every day | ORAL | 3 refills | Status: AC

## 2023-09-25 NOTE — Telephone Encounter (Signed)
-----   Message from [facility] sent at 09/11/2023 12:54 PM EDT ----- Regarding: fecal cal  ----- Message ----- From: Watt Sonny POUR, CMA Sent: 09/11/2023   8:00 AM EDT To: Clarita CHRISTELLA Favre, CMA  Patient needs fecal cal protectin in mid August

## 2023-09-25 NOTE — Progress Notes (Signed)
 Subjective:    Patient ID: Theresa Farmer, female    DOB: 04-11-1980, 43 y.o.   MRN: [id number]  Hypertension  Patient's blood pressure has recently been elevated.  Blood pressure at home is typically 140-150/90.  She denies any chest pain or shortness of breath or dyspnea on exertion.  She has been under more stress recently with work and with her son moving away to college. Past Medical History:  Diagnosis Date   Allergy    Endometriosis    Gastritis    GERD (gastroesophageal reflux disease)    H/O seasonal allergies    IBS (irritable bowel syndrome)    Indeterminate colitis    Ulcerative colitis (HCC)    Past Surgical History:  Procedure Laterality Date   COLONOSCOPY  11/2014, 03/2016   GANGLION CYST EXCISION Left 04/15/2018   Procedure: REMOVAL VOLAR GANGLION OF LEFT WRIST;  Surgeon: Murrell Drivers, MD;  Location: [facility];  Service: Orthopedics;  Laterality: Left;   TUBAL LIGATION     WISDOM TOOTH EXTRACTION     Current Outpatient Medications on File Prior to Visit  Medication Sig Dispense Refill   predniSONE  (DELTASONE ) 10 MG tablet Take by mouth.     acetaminophen  (TYLENOL ) 500 MG tablet Take 2 tablets (1,000 mg total) by mouth every 6 (six) hours as needed. 30 tablet 0   celecoxib  (CELEBREX ) 200 MG capsule Take 1 capsule (200 mg total) by mouth 2 (two) times daily. 60 capsule 3   cetirizine  (ZYRTEC ) 10 MG tablet Take 10 mg by mouth daily as needed for allergies.     Cholecalciferol  (D-3-5) 5000 units capsule Take 2 capsules (10,000 Units total) by mouth daily.     cyclobenzaprine  (FLEXERIL ) 5 MG tablet Take 1 tablet (5 mg total) by mouth every 8 (eight) hours as needed for muscle spasms. 30 tablet 0   fluticasone  (FLONASE ) 50 MCG/ACT nasal spray Place 1 spray into both nostrils daily as needed for allergies. 48 g 2   gabapentin  (NEURONTIN ) 300 MG capsule Take 1 capsule (300 mg total) by mouth 3 (three) times daily as needed (nerve pain). 30 capsule 3    polyethylene glycol (MIRALAX ) 17 g packet Take 17 g by mouth daily. Titrate as needed 14 each 0   predniSONE  (STERAPRED UNI-PAK 21 TAB) 10 MG (21) TBPK tablet Take by mouth daily. Take 6 tabs by mouth daily  for 2 days, then 5 tabs for 2 days, then 4 tabs for 2 days, then 3 tabs for 2 days, 2 tabs for 2 days, then 1 tab by mouth daily for 2 days 42 tablet 0   Upadacitinib  ER (RINVOQ ) 30 MG TB24 Take 1 tablet by mouth daily. 90 tablet 3   vitamin C  (ASCORBIC ACID) 500 MG tablet Take 1 tablet (500 mg total) by mouth daily.     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    Comment: tubal  Other Topics Concern   Not on file  Social History Narrative   Not on file   Social Drivers of Health   Financial Resource Strain: Not on file  Food Insecurity: Not on file  Transportation Needs: Not on file  Physical Activity: Not on file  Stress: Not on file  Social Connections: Not on file  Intimate Partner Violence: Not on file      Review of Systems  All other systems reviewed and are negative.      Objective:   Physical Exam Vitals reviewed.  Constitutional:      General: She is not in acute distress.     Appearance: Normal appearance. She is obese. She is not ill-appearing, toxic-appearing or diaphoretic.  Cardiovascular:     Rate and Rhythm: Normal rate and regular rhythm.     Heart sounds: Normal heart sounds. No murmur heard.    No friction rub. No gallop.  Pulmonary:     Effort: Pulmonary effort is normal. No respiratory distress.     Breath sounds: Normal breath sounds. No wheezing, rhonchi or rales.  Abdominal:     General: Abdomen is flat. Bowel sounds are normal.     Palpations: Abdomen is soft.  Musculoskeletal:     Right lower leg: No edema.     Left lower leg: No edema.  Neurological:     Mental Status: She is alert.           Assessment & Plan:  Benign essential HTN - Plan: CBC with Differential/Platelet, Comprehensive metabolic panel with GFR Start losartan  50 mg daily and recheck blood pressure in 2 weeks.  Check CBC and CMP today.

## 2023-09-25 NOTE — Telephone Encounter (Signed)
 Called and spoke to pt and asked her to go to the lab for fecal calprotectin. Order is in

## 2023-09-26 LAB — CBC WITH DIFFERENTIAL/PLATELET
Absolute Lymphocytes: 4340 {cells}/uL — ABNORMAL HIGH (ref 850–3900)
Absolute Monocytes: 1254 {cells}/uL — ABNORMAL HIGH (ref 200–950)
Basophils Absolute: 33 {cells}/uL (ref 0–200)
Basophils Relative: 0.2 %
Eosinophils Absolute: 17 {cells}/uL (ref 15–500)
Eosinophils Relative: 0.1 %
HCT: 39.4 % (ref 35.0–45.0)
Hemoglobin: 12.3 g/dL (ref 11.7–15.5)
MCH: 25.9 pg — ABNORMAL LOW (ref 27.0–33.0)
MCHC: 31.2 g/dL — ABNORMAL LOW (ref 32.0–36.0)
MCV: 82.9 fL (ref 80.0–100.0)
MPV: 10 fL (ref 7.5–12.5)
Monocytes Relative: 7.6 %
Neutro Abs: 10857 {cells}/uL — ABNORMAL HIGH (ref 1500–7800)
Neutrophils Relative %: 65.8 %
Platelets: 319 Thousand/uL (ref 140–400)
RBC: 4.75 Million/uL (ref 3.80–5.10)
RDW: 12.4 % (ref 11.0–15.0)
Total Lymphocyte: 26.3 %
WBC: 16.5 Thousand/uL — ABNORMAL HIGH (ref 3.8–10.8)

## 2023-09-26 LAB — COMPREHENSIVE METABOLIC PANEL WITH GFR
AG Ratio: 1.5 (calc) (ref 1.0–2.5)
ALT: 21 U/L (ref 6–29)
AST: 16 U/L (ref 10–30)
Albumin: 4 g/dL (ref 3.6–5.1)
Alkaline phosphatase (APISO): 73 U/L (ref 31–125)
BUN/Creatinine Ratio: 15 (calc) (ref 6–22)
BUN: 17 mg/dL (ref 7–25)
CO2: 26 mmol/L (ref 20–32)
Calcium: 8.6 mg/dL (ref 8.6–10.2)
Chloride: 105 mmol/L (ref 98–110)
Creat: 1.16 mg/dL — ABNORMAL HIGH (ref 0.50–0.99)
Globulin: 2.7 g/dL (ref 1.9–3.7)
Glucose, Bld: 73 mg/dL (ref 65–99)
Potassium: 3.7 mmol/L (ref 3.5–5.3)
Sodium: 138 mmol/L (ref 135–146)
Total Bilirubin: 0.4 mg/dL (ref 0.2–1.2)
Total Protein: 6.7 g/dL (ref 6.1–8.1)
eGFR: 60 mL/min/1.73m2 (ref >=60)

## 2023-09-27 ENCOUNTER — Ambulatory Visit: Payer: Self-pay | Admitting: Family Medicine

## 2023-09-28 ENCOUNTER — Other Ambulatory Visit: Payer: Self-pay

## 2023-10-02 ENCOUNTER — Ambulatory Visit: Admitting: Radiology

## 2023-10-02 ENCOUNTER — Other Ambulatory Visit: Payer: Self-pay

## 2023-10-15 ENCOUNTER — Other Ambulatory Visit

## 2023-10-15 DIAGNOSIS — D72829 Elevated white blood cell count, unspecified: Secondary | ICD-10-CM | POA: Diagnosis not present

## 2023-10-15 LAB — CBC WITH DIFFERENTIAL/PLATELET
Absolute Lymphocytes: 2300 {cells}/uL (ref 850–3900)
Absolute Monocytes: 486 {cells}/uL (ref 200–950)
Basophils Absolute: 32 {cells}/uL (ref 0–200)
Basophils Relative: 0.4 %
Eosinophils Absolute: 49 {cells}/uL (ref 15–500)
Eosinophils Relative: 0.6 %
HCT: 40.1 % (ref 35.0–45.0)
Hemoglobin: 12.4 g/dL (ref 11.7–15.5)
MCH: 25.9 pg — ABNORMAL LOW (ref 27.0–33.0)
MCHC: 30.9 g/dL — ABNORMAL LOW (ref 32.0–36.0)
MCV: 83.7 fL (ref 80.0–100.0)
MPV: 10.3 fL (ref 7.5–12.5)
Monocytes Relative: 6 %
Neutro Abs: 5233 {cells}/uL (ref 1500–7800)
Neutrophils Relative %: 64.6 %
Platelets: 266 Thousand/uL (ref 140–400)
RBC: 4.79 Million/uL (ref 3.80–5.10)
RDW: 12.5 % (ref 11.0–15.0)
Total Lymphocyte: 28.4 %
WBC: 8.1 Thousand/uL (ref 3.8–10.8)

## 2023-10-16 ENCOUNTER — Ambulatory Visit: Payer: Self-pay | Admitting: Family Medicine

## 2023-10-23 ENCOUNTER — Encounter (HOSPITAL_COMMUNITY): Payer: Self-pay | Admitting: Family Medicine

## 2023-10-25 ENCOUNTER — Other Ambulatory Visit: Payer: Self-pay

## 2023-10-25 ENCOUNTER — Other Ambulatory Visit: Payer: Self-pay | Admitting: Family Medicine

## 2023-10-25 DIAGNOSIS — M51369 Other intervertebral disc degeneration, lumbar region without mention of lumbar back pain or lower extremity pain: Secondary | ICD-10-CM

## 2023-10-29 ENCOUNTER — Other Ambulatory Visit: Payer: Self-pay

## 2023-11-06 ENCOUNTER — Other Ambulatory Visit: Payer: Self-pay

## 2023-11-06 NOTE — Discharge Instructions (Signed)

## 2023-11-06 NOTE — Progress Notes (Signed)
 Specialty Pharmacy Refill Coordination Note  Theresa Farmer is a 43 y.o. female contacted today regarding refills of specialty medication(s) Upadacitinib  (Rinvoq )   Patient requested Delivery   Delivery date: 11/07/23   Verified address: [address]   [place] [id number]   Medication will be filled on 11/06/23.

## 2023-11-07 ENCOUNTER — Ambulatory Visit
Admission: RE | Admit: 2023-11-07 | Discharge: 2023-11-07 | Disposition: A | Discharge status: Home / Self Care | Source: Ambulatory Visit | Attending: Family Medicine | Admitting: Family Medicine

## 2023-11-07 DIAGNOSIS — M5126 Other intervertebral disc displacement, lumbar region: Secondary | ICD-10-CM | POA: Diagnosis not present

## 2023-11-07 DIAGNOSIS — M51369 Other intervertebral disc degeneration, lumbar region without mention of lumbar back pain or lower extremity pain: Secondary | ICD-10-CM

## 2023-11-07 MED ORDER — METHYLPREDNISOLONE ACETATE 40 MG/ML INJ SUSP (RADIOLOG
80.0000 mg | Freq: Once | INTRAMUSCULAR | Status: AC
  Administered 2023-11-07: 80 mg via EPIDURAL

## 2023-11-07 MED ORDER — IOPAMIDOL (ISOVUE-M 200) INJECTION 41%
1.0000 mL | Freq: Once | INTRAMUSCULAR | Status: AC
  Administered 2023-11-07: 1 mL via EPIDURAL

## 2023-11-20 ENCOUNTER — Other Ambulatory Visit (HOSPITAL_COMMUNITY)
Admission: RE | Admit: 2023-11-20 | Discharge: 2023-11-20 | Disposition: A | Discharge status: Home / Self Care | Source: Ambulatory Visit | Attending: Radiology | Admitting: Radiology

## 2023-11-20 ENCOUNTER — Encounter: Payer: Self-pay | Admitting: Radiology

## 2023-11-20 ENCOUNTER — Ambulatory Visit (INDEPENDENT_AMBULATORY_CARE_PROVIDER_SITE_OTHER): Admitting: Radiology

## 2023-11-20 VITALS — BP 116/74 | HR 105 | Ht 66.0 in | Wt 226.0 lb

## 2023-11-20 DIAGNOSIS — Z1331 Encounter for screening for depression: Secondary | ICD-10-CM

## 2023-11-20 DIAGNOSIS — N898 Other specified noninflammatory disorders of vagina: Secondary | ICD-10-CM | POA: Diagnosis not present

## 2023-11-20 DIAGNOSIS — Z01419 Encounter for gynecological examination (general) (routine) without abnormal findings: Secondary | ICD-10-CM | POA: Insufficient documentation

## 2023-11-20 DIAGNOSIS — N926 Irregular menstruation, unspecified: Secondary | ICD-10-CM

## 2023-11-20 LAB — FOLLICLE STIMULATING HORMONE: FSH: 3.6 m[IU]/mL

## 2023-11-20 LAB — WET PREP FOR TRICH, YEAST, CLUE

## 2023-11-20 LAB — ESTRADIOL: Estradiol: 123 pg/mL

## 2023-11-20 MED ORDER — CLINDAMYCIN HCL 300 MG PO CAPS: 300.0000 mg | ORAL_CAPSULE | Freq: Two times a day (BID) | ORAL | 0 refills | Status: DC

## 2023-11-20 NOTE — Patient Instructions (Signed)

## 2023-11-20 NOTE — Progress Notes (Signed)
 Theresa Farmer 1980/11/11 [id number]   History:  43 y.o. G4P4 presents for annual exam.Complains of ? Perimenopause, irregular periods with Mirena , heavy post coital bleeding, hot flashes, brain fog, vaginal odor ? Lost tampon x 2 weeks.   Gynecologic History No LMP recorded. (Menstrual status: IUD). Period Pattern: (!) Irregular Menstrual Flow: Light, Moderate Menstrual Control: Tampon, Panty liner Dysmenorrhea: (!) Mild Dysmenorrhea Symptoms: Cramping Contraception/Family planning: IUD Sexually active: yes Last Pap: ?2023 Last mammogram: 11/28/22 normal, u/s 06/01/23 normal  Obstetric History OB History  Gravida Para Term Preterm AB Living  4     4  SAB IAB Ectopic Multiple Live Births      4    # Outcome Date GA Lbr Len/2nd Weight Sex Type Anes PTL Lv  4 Gravida           3 Gravida           2 Gravida           1 Gravida                11/20/2023    3:05 PM 09/25/2023   10:32 AM 08/09/2023   11:03 AM  Depression screen PHQ 2/9  Decreased Interest 0 0 0  Down, Depressed, Hopeless 0 0 0  PHQ - 2 Score 0 0 0     The following portions of the patient's history were reviewed and updated as appropriate: allergies, current medications, past family history, past medical history, past social history, past surgical history, and problem list.  Review of Systems  All other systems reviewed and are negative.   Past medical history, past surgical history, family history and social history were all reviewed and documented in the EPIC chart.  Exam:  Vitals:   11/20/23 1502  BP: 116/74  Pulse: (!) 105  SpO2: 97%  Weight: 226 lb (102.5 kg)  Height: 5' 6 (1.676 m)   Body mass index is 36.48 kg/m.  Physical Exam Vitals and nursing note reviewed. Exam conducted with a chaperone present.  Constitutional:      Appearance: Normal appearance. She is normal weight.  HENT:     Head: Normocephalic and atraumatic.  Neck:     Thyroid: No thyroid mass, thyromegaly or thyroid  tenderness.  Cardiovascular:     Rate and Rhythm: Regular rhythm.     Heart sounds: Normal heart sounds.  Pulmonary:     Effort: Pulmonary effort is normal.     Breath sounds: Normal breath sounds.  Chest:  Breasts:    Breasts are symmetrical.     Right: Normal. No inverted nipple, mass, nipple discharge, skin change or tenderness.     Left: Normal. No inverted nipple, mass, nipple discharge, skin change or tenderness.  Abdominal:     General: Abdomen is flat. Bowel sounds are normal.     Palpations: Abdomen is soft.  Genitourinary:    General: Normal vulva.     Vagina: Vaginal discharge and bleeding present. No lesions.     Cervix: Normal. No discharge or lesion.     Uterus: Normal. Not enlarged and not tender.      Adnexa: Right adnexa normal and left adnexa normal.       Right: No mass, tenderness or fullness.         Left: No mass, tenderness or fullness.    Lymphadenopathy:     Upper Body:     Right upper body: No axillary adenopathy.     Left upper body: No axillary  adenopathy.  Skin:    General: Skin is warm and dry.  Neurological:     Mental Status: She is alert and oriented to person, place, and time.  Psychiatric:        Mood and Affect: Mood normal.        Thought Content: Thought content normal.        Judgment: Judgment normal.      Theresa Farmer, CMA present for exam  Assessment/Plan:   1. Well woman exam with routine gynecological exam (Primary) - Cytology - PAP( [place])  2. Irregular menses - FSH - Estradiol - US  PELVIS TRANSVAGINAL NON-OB (TV ONLY); Future  3. Vaginal odor Reassured no retained tampon - WET PREP FOR TRICH, YEAST, CLUE + BV Rx sent for clindamycin BID x7 days  4. Depression screen     Return in about 1 year (around 11/19/2024) for Annual.  Theresa Farmer B WHNP-BC 3:21 PM 11/20/2023

## 2023-11-21 ENCOUNTER — Ambulatory Visit: Payer: Self-pay | Admitting: Radiology

## 2023-11-21 ENCOUNTER — Other Ambulatory Visit

## 2023-11-21 DIAGNOSIS — N926 Irregular menstruation, unspecified: Secondary | ICD-10-CM | POA: Diagnosis not present

## 2023-11-21 LAB — CYTOLOGY - PAP
Comment: NEGATIVE
Diagnosis: NEGATIVE
High risk HPV: NEGATIVE

## 2023-11-23 NOTE — Telephone Encounter (Signed)
 Spoke with pt she is aware of the results. I have sent a message to the front asking them to schedule an appt with EB or GH to discuss the [facility].

## 2023-11-26 ENCOUNTER — Other Ambulatory Visit: Payer: Self-pay

## 2023-11-26 ENCOUNTER — Other Ambulatory Visit (HOSPITAL_COMMUNITY): Payer: Self-pay

## 2023-11-26 MED ORDER — LOSARTAN POTASSIUM 50 MG PO TABS
50.0000 mg | ORAL_TABLET | Freq: Every day | ORAL | 3 refills | Status: AC
  Filled 2023-11-26: qty 90, 90d supply, fill #0
  Filled 2024-03-05: qty 90, 90d supply, fill #1

## 2023-11-27 ENCOUNTER — Ambulatory Visit: Admitting: Obstetrics and Gynecology

## 2023-11-27 ENCOUNTER — Other Ambulatory Visit (HOSPITAL_COMMUNITY): Payer: Self-pay

## 2023-11-27 NOTE — Telephone Encounter (Signed)
 Plan of care reviewed with Jami.   Spoke with patient. Reviewed plan of care.  OV cancelled for today. [facility] scheduled for 12/13/23 at [id number] with Dr. Dallie. Questions answered. Patient verbalizes understanding and is appreciative of call.   Routing to provider for final review. Patient is agreeable to disposition. Will close encounter.  Cc: Dr. Dallie

## 2023-12-03 ENCOUNTER — Other Ambulatory Visit (HOSPITAL_COMMUNITY): Payer: Self-pay

## 2023-12-03 ENCOUNTER — Other Ambulatory Visit: Payer: Self-pay

## 2023-12-03 NOTE — Progress Notes (Signed)
 Specialty Pharmacy Refill Coordination Note  Spoke with Adrean Findlay  Rodnisha Blomgren is a 43 y.o. female contacted today regarding refills of specialty medication(s) Upadacitinib  (Rinvoq )  Doses on hand: 10  Patient requested: Delivery   Delivery date: 12/06/23   Verified address: [address] [place] [address]  Medication will be filled on 12/05/23.

## 2023-12-04 ENCOUNTER — Other Ambulatory Visit: Payer: Self-pay

## 2023-12-04 ENCOUNTER — Ambulatory Visit
Admission: RE | Admit: 2023-12-04 | Discharge: 2023-12-04 | Disposition: A | Source: Ambulatory Visit | Attending: Family Medicine | Admitting: Family Medicine

## 2023-12-04 ENCOUNTER — Ambulatory Visit
Admission: RE | Admit: 2023-12-04 | Discharge: 2023-12-04 | Disposition: A | Discharge status: Home / Self Care | Source: Ambulatory Visit | Attending: Family Medicine | Admitting: Family Medicine

## 2023-12-04 DIAGNOSIS — N6313 Unspecified lump in the right breast, lower outer quadrant: Secondary | ICD-10-CM | POA: Diagnosis not present

## 2023-12-04 DIAGNOSIS — R928 Other abnormal and inconclusive findings on diagnostic imaging of breast: Secondary | ICD-10-CM | POA: Diagnosis not present

## 2023-12-04 DIAGNOSIS — N6310 Unspecified lump in the right breast, unspecified quadrant: Secondary | ICD-10-CM

## 2023-12-06 ENCOUNTER — Other Ambulatory Visit: Payer: Self-pay | Admitting: Obstetrics and Gynecology

## 2023-12-06 ENCOUNTER — Encounter: Payer: Self-pay | Admitting: Obstetrics and Gynecology

## 2023-12-06 DIAGNOSIS — N9489 Other specified conditions associated with female genital organs and menstrual cycle: Secondary | ICD-10-CM

## 2023-12-13 ENCOUNTER — Other Ambulatory Visit: Admitting: Obstetrics and Gynecology

## 2023-12-13 ENCOUNTER — Other Ambulatory Visit

## 2023-12-13 NOTE — Progress Notes (Deleted)
sis

## 2023-12-27 ENCOUNTER — Other Ambulatory Visit: Payer: Self-pay

## 2024-01-01 ENCOUNTER — Other Ambulatory Visit: Payer: Self-pay

## 2024-01-01 NOTE — Progress Notes (Signed)
 Specialty Pharmacy Refill Coordination Note  Theresa Farmer is a 43 y.o. female contacted today regarding refills of specialty medication(s) Upadacitinib  (Rinvoq )   Patient requested Delivery   Delivery date: 01/08/24   Verified address: [address] [place] [address]   Medication will be filled on: 01/07/24

## 2024-01-07 ENCOUNTER — Other Ambulatory Visit: Payer: Self-pay

## 2024-01-14 ENCOUNTER — Other Ambulatory Visit (HOSPITAL_COMMUNITY): Payer: Self-pay

## 2024-01-24 ENCOUNTER — Other Ambulatory Visit (HOSPITAL_COMMUNITY)
Admission: RE | Admit: 2024-01-24 | Discharge: 2024-01-24 | Disposition: A | Source: Ambulatory Visit | Attending: Obstetrics and Gynecology | Admitting: Obstetrics and Gynecology

## 2024-01-24 ENCOUNTER — Ambulatory Visit (INDEPENDENT_AMBULATORY_CARE_PROVIDER_SITE_OTHER)

## 2024-01-24 ENCOUNTER — Encounter: Payer: Self-pay | Admitting: Obstetrics and Gynecology

## 2024-01-24 ENCOUNTER — Ambulatory Visit (INDEPENDENT_AMBULATORY_CARE_PROVIDER_SITE_OTHER): Admitting: Obstetrics and Gynecology

## 2024-01-24 VITALS — BP 104/72 | HR 75 | Temp 98.0°F | Wt 225.0 lb

## 2024-01-24 DIAGNOSIS — N939 Abnormal uterine and vaginal bleeding, unspecified: Secondary | ICD-10-CM

## 2024-01-24 DIAGNOSIS — N9489 Other specified conditions associated with female genital organs and menstrual cycle: Secondary | ICD-10-CM

## 2024-01-24 DIAGNOSIS — N898 Other specified noninflammatory disorders of vagina: Secondary | ICD-10-CM | POA: Diagnosis not present

## 2024-01-24 DIAGNOSIS — Z01812 Encounter for preprocedural laboratory examination: Secondary | ICD-10-CM

## 2024-01-24 DIAGNOSIS — Z975 Presence of (intrauterine) contraceptive device: Secondary | ICD-10-CM

## 2024-01-24 LAB — PREGNANCY, URINE: Preg Test, Ur: NEGATIVE

## 2024-01-24 NOTE — Patient Instructions (Signed)
 It is common to have vaginal bleeding and cramping for up to 72 hours after your biopsy. Please call our office with heavy vaginal bleeding, severe abdominal pain or fever. Avoid intercourse, tampon use, douching and baths for 7 days to decrease the risk of infection.

## 2024-01-24 NOTE — Progress Notes (Signed)
 43 y.o. [id number] female with Mirena  IUD (placed 10/06/21 for AUB), AUB and PCB, hx of BTL here for SIS, referred by J. Chrzanowski . Married.  No LMP recorded. (Menstrual status: IUD).   Recently treated for BV. Sexually active: yes, she has heavy PCB and ongoing bleeding with Mirena  IUD. Some cramping but overall mild.  OB History  Gravida Para Term Preterm AB Living  4 4 4   4   SAB IAB Ectopic Multiple Live Births      4    # Outcome Date GA Lbr Len/2nd Weight Sex Type Anes PTL Lv  4 Term      Vag-Spont   LIV  3 Term      Vag-Spont   LIV  2 Term      Vag-Spont   LIV  1 Term      Vag-Spont   LIV   Past Medical History:  Diagnosis Date   Allergy    Endometriosis    Gastritis    GERD (gastroesophageal reflux disease)    H/O seasonal allergies    Hypertension    IBS (irritable bowel syndrome)    Indeterminate colitis    Ulcerative colitis (HCC)    Past Surgical History:  Procedure Laterality Date   COLONOSCOPY  11/2014, 03/2016   GANGLION CYST EXCISION Left 04/15/2018   Procedure: REMOVAL VOLAR GANGLION OF LEFT WRIST;  Surgeon: Murrell Drivers, MD;  Location: [facility];  Service: Orthopedics;  Laterality: Left;   TUBAL LIGATION     WISDOM TOOTH EXTRACTION     Current Outpatient Medications on File Prior to Visit  Medication Sig Dispense Refill   acetaminophen  (TYLENOL ) 500 MG tablet Take 2 tablets (1,000 mg total) by mouth every 6 (six) hours as needed. 30 tablet 0   cetirizine  (ZYRTEC ) 10 MG tablet Take 10 mg by mouth daily as needed for allergies.     Cholecalciferol  (D-3-5) 5000 units capsule Take 2 capsules (10,000 Units total) by mouth daily.     cyclobenzaprine  (FLEXERIL ) 5 MG tablet Take 1 tablet (5 mg total) by mouth every 8 (eight) hours as needed for muscle spasms. 30 tablet 0   fluticasone  (FLONASE ) 50 MCG/ACT nasal spray Place 1 spray into both nostrils daily as needed for allergies. 48 g 2   gabapentin  (NEURONTIN ) 300 MG capsule Take 1 capsule  (300 mg total) by mouth 3 (three) times daily as needed (nerve pain). 30 capsule 3   losartan  (COZAAR ) 50 MG tablet Take 1 tablet (50 mg total) by mouth daily. 90 tablet 3   polyethylene glycol (MIRALAX ) 17 g packet Take 17 g by mouth daily. Titrate as needed 14 each 0   Upadacitinib  ER (RINVOQ ) 30 MG TB24 Take 1 tablet by mouth daily. 90 tablet 3   vitamin C  (ASCORBIC ACID) 500 MG tablet Take 1 tablet (500 mg total) by mouth daily.     Current Facility-Administered Medications on File Prior to Visit  Medication Dose Route Frequency Provider Last Rate Last Admin   levonorgestrel  (MIRENA ) 20 MCG/DAY IUD 1 each  1 each Intrauterine Once Chrzanowski, Jami B, NP       Allergies  Allergen Reactions   Flagyl  [Metronidazole ] Nausea And Vomiting    Nausea and vomiting associated with oral Flagyl  pills.  She is able to tolerate intravaginal application of Flagyl  gel.   Keflex [Cephalexin] Hives    hives      PE Today's Vitals   01/24/24 0821  BP: 104/72  Pulse: 75  Temp: 98 F (36.7 C)  TempSrc: Oral  SpO2: 98%  Weight: 225 lb (102.1 kg)   Body mass index is 36.32 kg/m.  Physical Exam Vitals reviewed. Exam conducted with a chaperone present.  Constitutional:      General: She is not in acute distress.    Appearance: Normal appearance.  HENT:     Head: Normocephalic and atraumatic.     Nose: Nose normal.  Eyes:     Extraocular Movements: Extraocular movements intact.     Conjunctiva/sclera: Conjunctivae normal.  Pulmonary:     Effort: Pulmonary effort is normal.  Genitourinary:    General: Normal vulva.     Exam position: Lithotomy position.     Vagina: Normal. No vaginal discharge.     Cervix: Normal. No cervical motion tenderness, discharge or lesion.     Uterus: Normal. Not enlarged and not tender.      Adnexa: Right adnexa normal and left adnexa normal.     Comments: IUD strings present. Musculoskeletal:        General: Normal range of motion.     Cervical back:  Normal range of motion.  Neurological:     General: No focal deficit present.     Mental Status: She is alert.  Psychiatric:        Mood and Affect: Mood normal.        Behavior: Behavior normal.     01/24/24 Procedure - sonohysterogram, endometrial biopsy Consent performed. Speculum placed in vagina. Discharge was noted and nuswab was collected. Sterile prep of cervix with Hibiclens  Cannula placed inside endometrial cavity without difficulty. Speculum removed. Sterile saline injected.             IUD was noted within endometrial cavity.             Possible 8 x 6 mm fundal filling defect noted.  Artifact from the IUD manage difficult to visualize this area Cannula removed. No complication.    Procedure - endometrial biopsy Consent performed. Speculum place in vagina.  Sterile prep of cervix with  hibiclens  Tenaculum to anterior cervical lip. Pipelle placed to  8 cm without difficulty once. Tissue obtained and sent to pathology. Speculum removed.  No complications. Minimal EBL.    Assessment and Plan:        Abnormal uterine bleeding (AUB) Uses hormone releasing intrauterine device (IUD) for contraception -     Surgical pathology Uncomplicated SIS with possible 8 x 6 mm fundal filling defect noted.  Artifact from the IUD manage difficult to visualize this area. Uncx EMB, f/u path Discussed that she will likely need a hysteroscopy pending pathology All questions answered  Pre-procedure lab exam -     Pregnancy, urine  Vaginal discharge -     SureSwab Advanced Vaginitis Plus,TMA   Vera LULLA Pa, MD

## 2024-01-27 ENCOUNTER — Ambulatory Visit: Payer: Self-pay | Admitting: Obstetrics and Gynecology

## 2024-01-27 DIAGNOSIS — B3731 Acute candidiasis of vulva and vagina: Secondary | ICD-10-CM

## 2024-01-27 LAB — SURESWAB® ADVANCED VAGINITIS PLUS,TMA
C. trachomatis RNA, TMA: NOT DETECTED
CANDIDA SPECIES: DETECTED — AB
Candida glabrata: NOT DETECTED
N. gonorrhoeae RNA, TMA: NOT DETECTED
SURESWAB(R) ADV BACTERIAL VAGINOSIS(BV),TMA: NEGATIVE
TRICHOMONAS VAGINALIS (TV),TMA: NOT DETECTED

## 2024-01-27 MED ORDER — FLUCONAZOLE 150 MG PO TABS: 150.0000 mg | ORAL_TABLET | Freq: Once | ORAL | 0 refills | Status: AC

## 2024-01-28 LAB — SURGICAL PATHOLOGY

## 2024-01-29 ENCOUNTER — Other Ambulatory Visit: Payer: Self-pay

## 2024-02-01 ENCOUNTER — Other Ambulatory Visit: Payer: Self-pay

## 2024-02-01 NOTE — Progress Notes (Signed)
 Specialty Pharmacy Refill Coordination Note  Theresa Farmer is a 43 y.o. female contacted today regarding refills of specialty medication(s) Upadacitinib  (Rinvoq )   Patient requested Delivery   Delivery date: 02/07/24   Verified address: [address] [place] [phone]   Medication will be filled on: 02/06/24

## 2024-02-06 ENCOUNTER — Other Ambulatory Visit: Payer: Self-pay

## 2024-02-25 ENCOUNTER — Encounter: Payer: Self-pay | Admitting: Obstetrics and Gynecology

## 2024-02-25 ENCOUNTER — Ambulatory Visit: Admitting: Obstetrics and Gynecology

## 2024-02-25 VITALS — BP 124/80 | HR 79 | Temp 98.4°F | Ht 66.25 in | Wt 226.0 lb

## 2024-02-25 DIAGNOSIS — N939 Abnormal uterine and vaginal bleeding, unspecified: Secondary | ICD-10-CM | POA: Insufficient documentation

## 2024-02-25 DIAGNOSIS — N840 Polyp of corpus uteri: Secondary | ICD-10-CM | POA: Diagnosis not present

## 2024-02-25 NOTE — Progress Notes (Signed)
 "  44 y.o. [id number] female with AUB, PCB, endometrial polyp, Mirena  IUD (inserted 2023), hypertension here for surgical consultation. Married. Presents with husband. 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  Patient's last menstrual period was 01/21/2024. 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 No dyspareunia however postcoital bleeding is heavy. No CP or SOB.  Diagnosed with yeast vaginitis recently and BV in September 2025.  Last PAP:    Component Value Date/Time   DIAGPAP  11/20/2023 1520    - Negative for intraepithelial lesion or malignancy (NILM)   HPVHIGH Negative 11/20/2023 1520   ADEQPAP  11/20/2023 1520    Satisfactory for evaluation; transformation zone component PRESENT.   11/21/23 TVUS: aginal ultrasound   Anteverted uterus Normal size and shape 8.22 x 5.32 x 5.14 cm Multiple small fibroids   IUD in proper position   endometrium 4.20mm 1cm intracavitary echogenic mass, not present on u/s in 2023   Both ovaries normal size with normal follicle pattern and perfusion   No adnexal masses   No free fluid  01/24/24 SIS:  IUD was noted within endometrial cavity.  Possible 8 x 6 mm fundal filling defect noted.  Artifact from the IUD manage difficult to visualize this area  01/24/24 path: A. ENDOMETRIUM, BIOPSY:  Benign endometrium with marked progestational effect  Focal stromal breakdown compatible with bleeding  Focal changes suggestive of endometrial polyp  Negative for atypia, hyperplasia and carcinoma   Birth control: BTL Sexually active: yes    GYN HISTORY: No sig hx  OB History  Gravida Para Term Preterm AB Living  4 4 4   4   SAB IAB Ectopic Multiple Live Births      4    # Outcome Date GA Lbr Len/2nd Weight Sex Type Anes PTL Lv  4 Term      Vag-Spont   LIV  3 Term       Vag-Spont   LIV  2 Term      Vag-Spont   LIV  1 Term      Vag-Spont   LIV    Past Medical History:  Diagnosis Date   Allergy    Endometriosis    Gastritis    GERD (gastroesophageal reflux disease)    H/O seasonal allergies    Hypertension    IBS (irritable bowel syndrome)    Indeterminate colitis    Ulcerative colitis (HCC)     Past Surgical History:  Procedure Laterality Date   COLONOSCOPY  11/2014, 03/2016   GANGLION CYST EXCISION Left 04/15/2018   Procedure: REMOVAL VOLAR GANGLION OF LEFT WRIST;  Surgeon: Murrell Drivers, MD;  Location: [facility];  Service: Orthopedics;  Laterality: Left;   TUBAL LIGATION     WISDOM TOOTH EXTRACTION      Medications Ordered Prior to Encounter[1]  Allergies[2]    PE Today's Vitals   02/25/24 0815  BP: 124/80  Pulse: 79  Temp: 98.4 F (36.9 C)  TempSrc: Oral  SpO2: 99%  Weight: 226 lb (102.5 kg)  Height: 5' 6.25 (1.683 m)   Body mass index is 36.2 kg/m.  Physical Exam Vitals reviewed.  Constitutional:      General: She is not in acute distress.    Appearance: Normal appearance.  HENT:     Head: Normocephalic and atraumatic.     Nose: Nose normal.  Eyes:  Extraocular Movements: Extraocular movements intact.     Conjunctiva/sclera: Conjunctivae normal.  Cardiovascular:     Rate and Rhythm: Normal rate and regular rhythm.     Heart sounds: No murmur heard.    No friction rub. No gallop.  Pulmonary:     Effort: Pulmonary effort is normal. No respiratory distress.     Breath sounds: Normal breath sounds. No stridor. No wheezing, rhonchi or rales.  Musculoskeletal:        General: Normal range of motion.     Cervical back: Normal range of motion.  Neurological:     General: No focal deficit present.     Mental Status: She is alert.  Psychiatric:        Mood and Affect: Mood normal.        Behavior: Behavior normal.      Assessment and Plan:        Abnormal uterine bleeding (AUB) Assessment &  Plan: HVB improved with Mirena , however now dealing with heavy postcoital bleeding Plan for hysteroscopy, dilation and curettage, polypectomy, IUD removal, NovaSure endometrial ablation.  Has had prior BTL  Discussed outpatient procedure that provides management of abnormal uterine bleeding for approximately 5 years. Reviewed that  recovery is usually 1-2 days. Risks including infections, bleeding, and damage to surrounding organs reviewed. Recommend NPO prior to midnight and reviewed medication to take on day of surgery. Dicussed use of NSAIDS as needed for pain postoperatively.  Preop checklist: Antibiotics: none DVT ppx: SCDs Postop visit: 1 week Additional clearance: none   Orders: -     Ambulatory Referral For Surgery Scheduling  Endometrial polyp -     Ambulatory Referral For Surgery Scheduling    Vera LULLA Pa, MD     [1]  Current Outpatient Medications on File Prior to Visit  Medication Sig Dispense Refill   acetaminophen  (TYLENOL ) 500 MG tablet Take 2 tablets (1,000 mg total) by mouth every 6 (six) hours as needed. 30 tablet 0   cetirizine  (ZYRTEC ) 10 MG tablet Take 10 mg by mouth daily as needed for allergies.     Cholecalciferol  (D-3-5) 5000 units capsule Take 2 capsules (10,000 Units total) by mouth daily.     cyclobenzaprine  (FLEXERIL ) 5 MG tablet Take 1 tablet (5 mg total) by mouth every 8 (eight) hours as needed for muscle spasms. 30 tablet 0   fluticasone  (FLONASE ) 50 MCG/ACT nasal spray Place 1 spray into both nostrils daily as needed for allergies. 48 g 2   gabapentin  (NEURONTIN ) 300 MG capsule Take 1 capsule (300 mg total) by mouth 3 (three) times daily as needed (nerve pain). 30 capsule 3   losartan  (COZAAR ) 50 MG tablet Take 1 tablet (50 mg total) by mouth daily. 90 tablet 3   polyethylene glycol (MIRALAX ) 17 g packet Take 17 g by mouth daily. Titrate as needed 14 each 0   Upadacitinib  ER (RINVOQ ) 30 MG TB24 Take 1 tablet by mouth daily. 90 tablet 3    vitamin C  (ASCORBIC ACID) 500 MG tablet Take 1 tablet (500 mg total) by mouth daily.     Current Facility-Administered Medications on File Prior to Visit  Medication Dose Route Frequency Provider Last Rate Last Admin   levonorgestrel  (MIRENA ) 20 MCG/DAY IUD 1 each  1 each Intrauterine Once Chrzanowski, Jami B, NP      [2]  Allergies Allergen Reactions   Flagyl  [Metronidazole ] Nausea And Vomiting    Nausea and vomiting associated with oral Flagyl  pills.  She is able to tolerate  intravaginal application of Flagyl  gel.   Keflex [Cephalexin] Hives    hives   "

## 2024-02-25 NOTE — Assessment & Plan Note (Addendum)
 HVB improved with Mirena , however now dealing with heavy postcoital bleeding Plan for hysteroscopy, dilation and curettage, polypectomy, IUD removal, NovaSure endometrial ablation.  Has had prior BTL  Discussed outpatient procedure that provides management of abnormal uterine bleeding for approximately 5 years. Reviewed that  recovery is usually 1-2 days. Risks including infections, bleeding, and damage to surrounding organs reviewed. Recommend NPO prior to midnight and reviewed medication to take on day of surgery. Dicussed use of NSAIDS as needed for pain postoperatively.  Preop checklist: Antibiotics: none DVT ppx: SCDs Postop visit: 1 week Additional clearance: none

## 2024-02-29 ENCOUNTER — Other Ambulatory Visit: Payer: Self-pay

## 2024-02-29 ENCOUNTER — Other Ambulatory Visit: Payer: Self-pay | Admitting: Gastroenterology

## 2024-02-29 ENCOUNTER — Other Ambulatory Visit: Payer: Self-pay | Admitting: Pharmacist

## 2024-02-29 ENCOUNTER — Other Ambulatory Visit (HOSPITAL_COMMUNITY): Payer: Self-pay

## 2024-02-29 MED ORDER — RINVOQ 30 MG PO TB24
1.0000 | ORAL_TABLET | Freq: Every day | ORAL | 0 refills | Status: DC
  Filled 2024-02-29: qty 90, 90d supply, fill #0

## 2024-02-29 MED ORDER — RINVOQ 30 MG PO TB24
1.0000 | ORAL_TABLET | Freq: Every day | ORAL | 0 refills | Status: AC
  Filled 2024-03-03: qty 90, 90d supply, fill #0
  Filled 2024-03-03: qty 30, 30d supply, fill #0

## 2024-03-03 ENCOUNTER — Other Ambulatory Visit: Payer: Self-pay

## 2024-03-03 ENCOUNTER — Other Ambulatory Visit (HOSPITAL_COMMUNITY): Payer: Self-pay

## 2024-03-04 ENCOUNTER — Other Ambulatory Visit (HOSPITAL_COMMUNITY): Payer: Self-pay

## 2024-03-05 ENCOUNTER — Other Ambulatory Visit: Payer: Self-pay

## 2024-03-05 ENCOUNTER — Other Ambulatory Visit: Payer: Self-pay | Admitting: Family Medicine

## 2024-03-05 ENCOUNTER — Other Ambulatory Visit (HOSPITAL_COMMUNITY): Payer: Self-pay

## 2024-03-05 NOTE — Progress Notes (Signed)
 Specialty Pharmacy Refill Coordination Note  Theresa Farmer is a 43 y.o. female contacted today regarding refills of specialty medication(s) Upadacitinib  (Rinvoq )   Patient requested Delivery   Delivery date: 03/14/24   Verified address: [address] [place] [address]   Medication will be filled on: 03/13/24

## 2024-03-13 ENCOUNTER — Other Ambulatory Visit: Payer: Self-pay
